# Patient Record
Sex: Female | Born: 1935 | Race: Black or African American | Hispanic: No | Smoking: Never smoker
Health system: Southern US, Community
[De-identification: ages and names within clinical notes are randomized; demographics above are authoritative.]

## PROBLEM LIST (undated history)

## (undated) DIAGNOSIS — I6529 Occlusion and stenosis of unspecified carotid artery: Secondary | ICD-10-CM

## (undated) DIAGNOSIS — I739 Peripheral vascular disease, unspecified: Secondary | ICD-10-CM

## (undated) DIAGNOSIS — E785 Hyperlipidemia, unspecified: Secondary | ICD-10-CM

## (undated) DIAGNOSIS — I10 Essential (primary) hypertension: Secondary | ICD-10-CM

## (undated) DIAGNOSIS — M1990 Unspecified osteoarthritis, unspecified site: Secondary | ICD-10-CM

## (undated) DIAGNOSIS — I639 Cerebral infarction, unspecified: Secondary | ICD-10-CM

## (undated) DIAGNOSIS — C569 Malignant neoplasm of unspecified ovary: Secondary | ICD-10-CM

## (undated) DIAGNOSIS — I701 Atherosclerosis of renal artery: Secondary | ICD-10-CM

## (undated) DIAGNOSIS — I350 Nonrheumatic aortic (valve) stenosis: Secondary | ICD-10-CM

## (undated) DIAGNOSIS — I671 Cerebral aneurysm, nonruptured: Secondary | ICD-10-CM

## (undated) DIAGNOSIS — I82409 Acute embolism and thrombosis of unspecified deep veins of unspecified lower extremity: Secondary | ICD-10-CM

## (undated) DIAGNOSIS — N189 Chronic kidney disease, unspecified: Secondary | ICD-10-CM

## (undated) DIAGNOSIS — I2510 Atherosclerotic heart disease of native coronary artery without angina pectoris: Secondary | ICD-10-CM

## (undated) DIAGNOSIS — D649 Anemia, unspecified: Secondary | ICD-10-CM

## (undated) HISTORY — PX: CORONARY ANGIOPLASTY WITH STENT PLACEMENT: SHX49

## (undated) HISTORY — PX: FEMORAL ARTERY STENT: SHX1583

## (undated) HISTORY — DX: Cerebral aneurysm, nonruptured: I67.1

## (undated) HISTORY — PX: APPENDECTOMY: SHX54

## (undated) HISTORY — DX: Malignant neoplasm of unspecified ovary: C56.9

## (undated) HISTORY — PX: BLADDER SURGERY: SHX569

## (undated) HISTORY — PX: TRANSTHORACIC ECHOCARDIOGRAM: SHX275

## (undated) HISTORY — DX: Occlusion and stenosis of unspecified carotid artery: I65.29

## (undated) HISTORY — DX: Hyperlipidemia, unspecified: E78.5

## (undated) HISTORY — DX: Atherosclerosis of renal artery: I70.1

## (undated) HISTORY — DX: Anemia, unspecified: D64.9

## (undated) HISTORY — PX: ABDOMINAL HYSTERECTOMY: SHX81

## (undated) HISTORY — PX: BREAST LUMPECTOMY: SHX2

## (undated) HISTORY — DX: Nonrheumatic aortic (valve) stenosis: I35.0

## (undated) HISTORY — DX: Acute embolism and thrombosis of unspecified deep veins of unspecified lower extremity: I82.409

## (undated) HISTORY — PX: OTHER SURGICAL HISTORY: SHX169

---

## 1961-08-26 DIAGNOSIS — C569 Malignant neoplasm of unspecified ovary: Secondary | ICD-10-CM

## 1961-08-26 HISTORY — DX: Malignant neoplasm of unspecified ovary: C56.9

## 1962-08-26 DIAGNOSIS — N189 Chronic kidney disease, unspecified: Secondary | ICD-10-CM

## 1962-08-26 HISTORY — DX: Chronic kidney disease, unspecified: N18.9

## 1998-11-28 ENCOUNTER — Emergency Department (HOSPITAL_COMMUNITY): Admission: EM | Admit: 1998-11-28 | Discharge: 1998-11-28 | Payer: Self-pay | Admitting: Emergency Medicine

## 1998-11-28 ENCOUNTER — Encounter: Payer: Self-pay | Admitting: Emergency Medicine

## 1999-09-14 ENCOUNTER — Encounter: Payer: Self-pay | Admitting: Emergency Medicine

## 1999-09-14 ENCOUNTER — Emergency Department (HOSPITAL_COMMUNITY): Admission: EM | Admit: 1999-09-14 | Discharge: 1999-09-14 | Payer: Self-pay | Admitting: Emergency Medicine

## 1999-12-10 ENCOUNTER — Emergency Department (HOSPITAL_COMMUNITY): Admission: EM | Admit: 1999-12-10 | Discharge: 1999-12-10 | Payer: Self-pay | Admitting: Emergency Medicine

## 2002-03-28 ENCOUNTER — Encounter: Payer: Self-pay | Admitting: Emergency Medicine

## 2002-03-28 ENCOUNTER — Emergency Department (HOSPITAL_COMMUNITY): Admission: EM | Admit: 2002-03-28 | Discharge: 2002-03-28 | Payer: Self-pay | Admitting: Emergency Medicine

## 2002-04-07 ENCOUNTER — Encounter: Payer: Self-pay | Admitting: Cardiology

## 2002-04-07 ENCOUNTER — Encounter: Admission: RE | Admit: 2002-04-07 | Discharge: 2002-04-07 | Payer: Self-pay | Admitting: Cardiology

## 2002-04-12 ENCOUNTER — Ambulatory Visit (HOSPITAL_COMMUNITY): Admission: RE | Admit: 2002-04-12 | Discharge: 2002-04-12 | Payer: Self-pay | Admitting: Cardiology

## 2004-03-07 ENCOUNTER — Ambulatory Visit (HOSPITAL_COMMUNITY): Admission: RE | Admit: 2004-03-07 | Discharge: 2004-03-07 | Payer: Self-pay | Admitting: *Deleted

## 2004-03-21 ENCOUNTER — Ambulatory Visit (HOSPITAL_COMMUNITY): Admission: RE | Admit: 2004-03-21 | Discharge: 2004-03-22 | Payer: Self-pay | Admitting: *Deleted

## 2004-04-04 ENCOUNTER — Ambulatory Visit (HOSPITAL_COMMUNITY): Admission: RE | Admit: 2004-04-04 | Discharge: 2004-04-05 | Payer: Self-pay | Admitting: *Deleted

## 2004-08-26 HISTORY — PX: RENAL ARTERY STENT: SHX2321

## 2004-08-26 HISTORY — PX: ILIAC ARTERY STENT: SHX1786

## 2005-05-26 DIAGNOSIS — I25119 Atherosclerotic heart disease of native coronary artery with unspecified angina pectoris: Secondary | ICD-10-CM | POA: Insufficient documentation

## 2005-05-26 DIAGNOSIS — I2510 Atherosclerotic heart disease of native coronary artery without angina pectoris: Secondary | ICD-10-CM

## 2005-05-26 HISTORY — DX: Atherosclerotic heart disease of native coronary artery without angina pectoris: I25.10

## 2005-05-27 ENCOUNTER — Ambulatory Visit (HOSPITAL_COMMUNITY): Admission: RE | Admit: 2005-05-27 | Discharge: 2005-05-28 | Payer: Self-pay | Admitting: Cardiology

## 2005-05-27 HISTORY — PX: CORONARY ANGIOPLASTY WITH STENT PLACEMENT: SHX49

## 2005-07-10 ENCOUNTER — Ambulatory Visit (HOSPITAL_COMMUNITY): Admission: RE | Admit: 2005-07-10 | Discharge: 2005-07-11 | Payer: Self-pay | Admitting: *Deleted

## 2005-07-31 ENCOUNTER — Ambulatory Visit: Payer: Self-pay | Admitting: *Deleted

## 2005-09-10 ENCOUNTER — Encounter: Admission: RE | Admit: 2005-09-10 | Discharge: 2005-09-10 | Payer: Self-pay | Admitting: Cardiology

## 2005-09-17 ENCOUNTER — Ambulatory Visit (HOSPITAL_COMMUNITY): Admission: AD | Admit: 2005-09-17 | Discharge: 2005-09-18 | Payer: Self-pay | Admitting: Cardiology

## 2006-02-04 ENCOUNTER — Ambulatory Visit: Payer: Self-pay | Admitting: Family Medicine

## 2006-02-10 ENCOUNTER — Ambulatory Visit: Payer: Self-pay | Admitting: Family Medicine

## 2006-03-03 ENCOUNTER — Ambulatory Visit: Payer: Self-pay | Admitting: Family Medicine

## 2006-03-03 ENCOUNTER — Encounter: Admission: RE | Admit: 2006-03-03 | Discharge: 2006-03-03 | Payer: Self-pay | Admitting: Family Medicine

## 2006-03-18 ENCOUNTER — Ambulatory Visit: Payer: Self-pay | Admitting: Internal Medicine

## 2006-04-01 ENCOUNTER — Ambulatory Visit: Payer: Self-pay | Admitting: Family Medicine

## 2006-04-04 ENCOUNTER — Emergency Department (HOSPITAL_COMMUNITY): Admission: EM | Admit: 2006-04-04 | Discharge: 2006-04-04 | Payer: Self-pay | Admitting: Emergency Medicine

## 2006-05-24 ENCOUNTER — Emergency Department (HOSPITAL_COMMUNITY): Admission: EM | Admit: 2006-05-24 | Discharge: 2006-05-24 | Payer: Self-pay | Admitting: Emergency Medicine

## 2006-05-26 ENCOUNTER — Ambulatory Visit: Payer: Self-pay | Admitting: Family Medicine

## 2006-05-27 ENCOUNTER — Emergency Department (HOSPITAL_COMMUNITY): Admission: EM | Admit: 2006-05-27 | Discharge: 2006-05-27 | Payer: Self-pay | Admitting: Emergency Medicine

## 2006-05-27 ENCOUNTER — Encounter: Admission: RE | Admit: 2006-05-27 | Discharge: 2006-05-27 | Payer: Self-pay | Admitting: Family Medicine

## 2006-05-29 ENCOUNTER — Ambulatory Visit: Payer: Self-pay | Admitting: Family Medicine

## 2006-07-29 ENCOUNTER — Ambulatory Visit: Payer: Self-pay | Admitting: Family Medicine

## 2007-01-12 IMAGING — CR DG CHEST 2V
2 series · 2 of 2 positions shown · non-contrast
Comparison: 09/10/05

CLINICAL DATA: Chronic cough. 
 CHEST ? 2 VIEW:

[w chest pa]
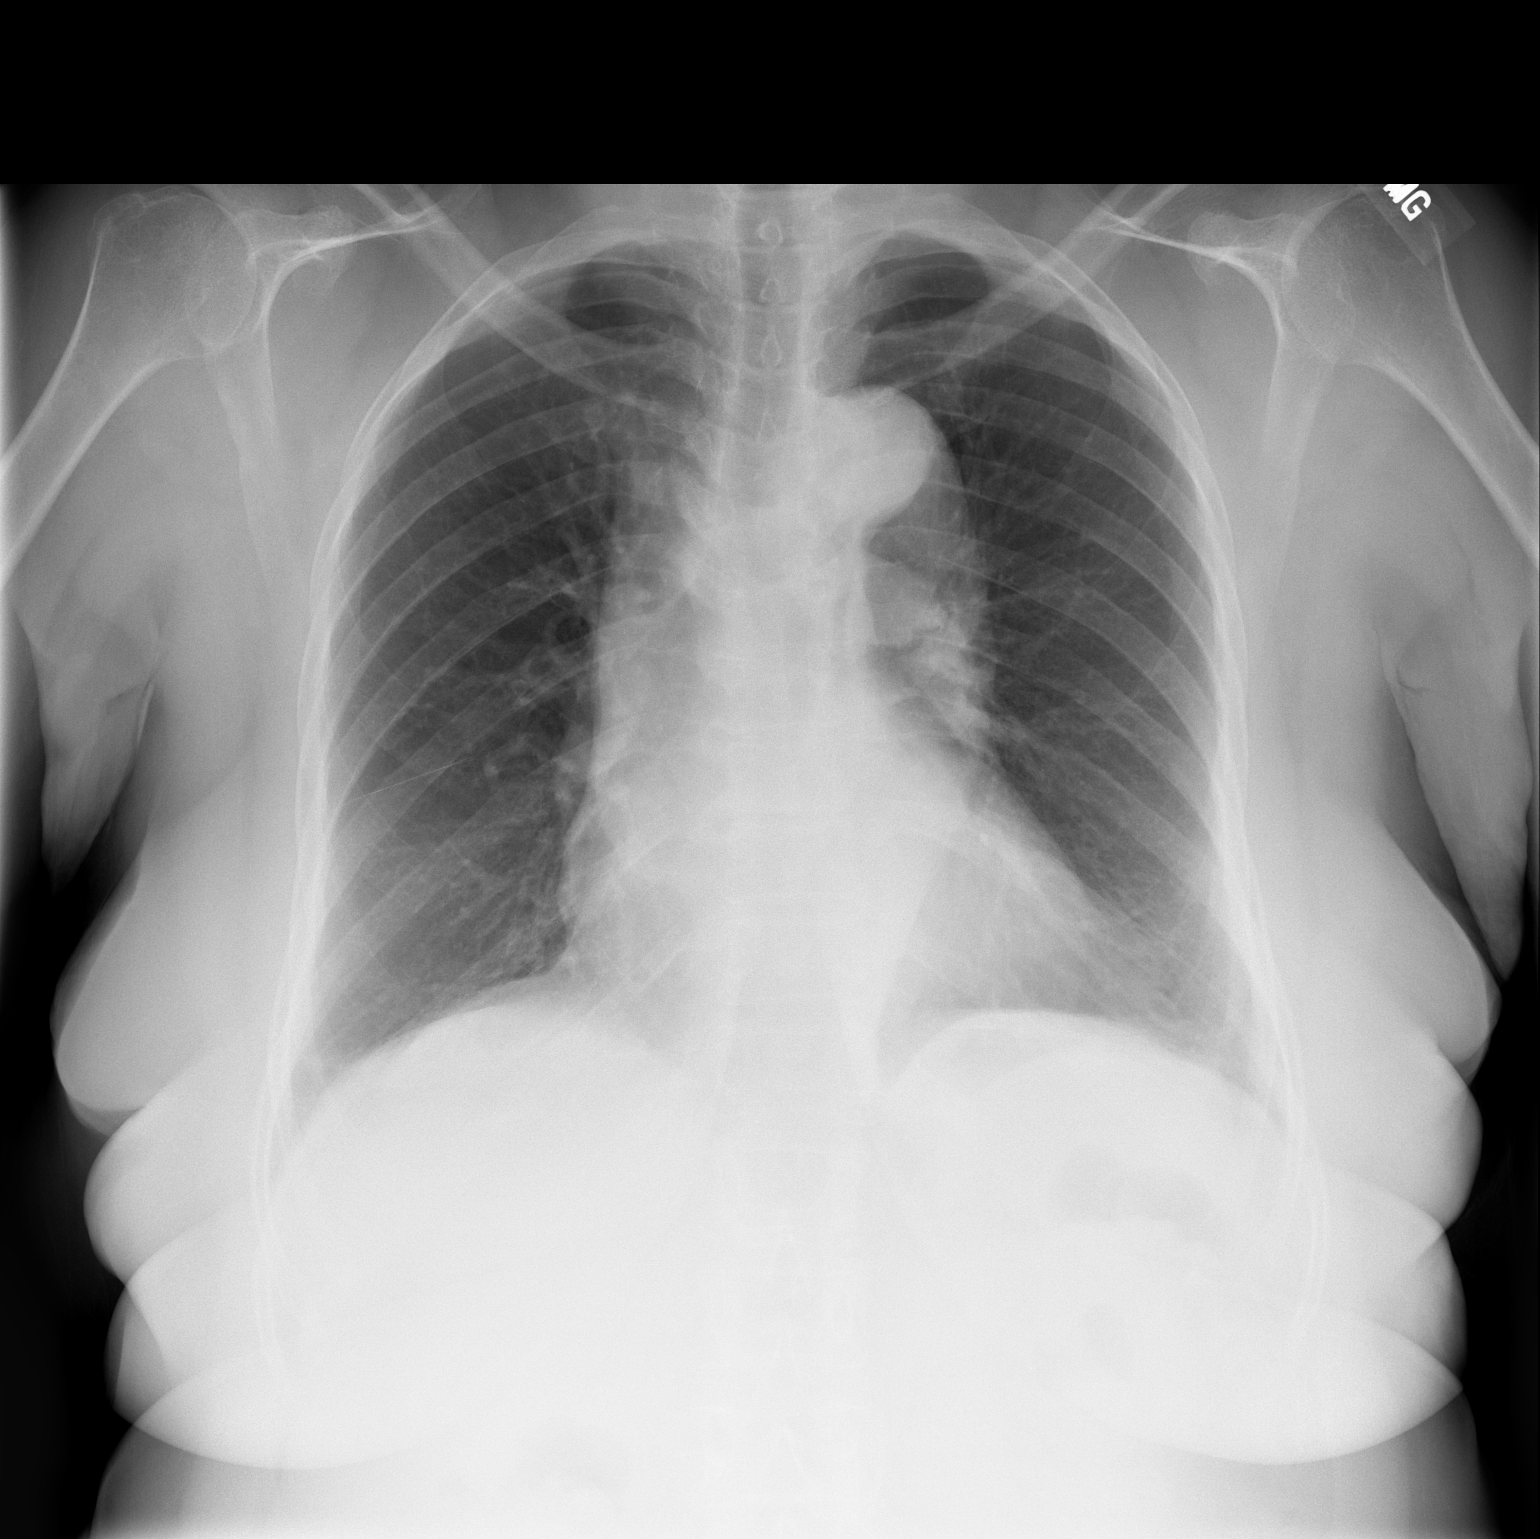

[w chest lat]
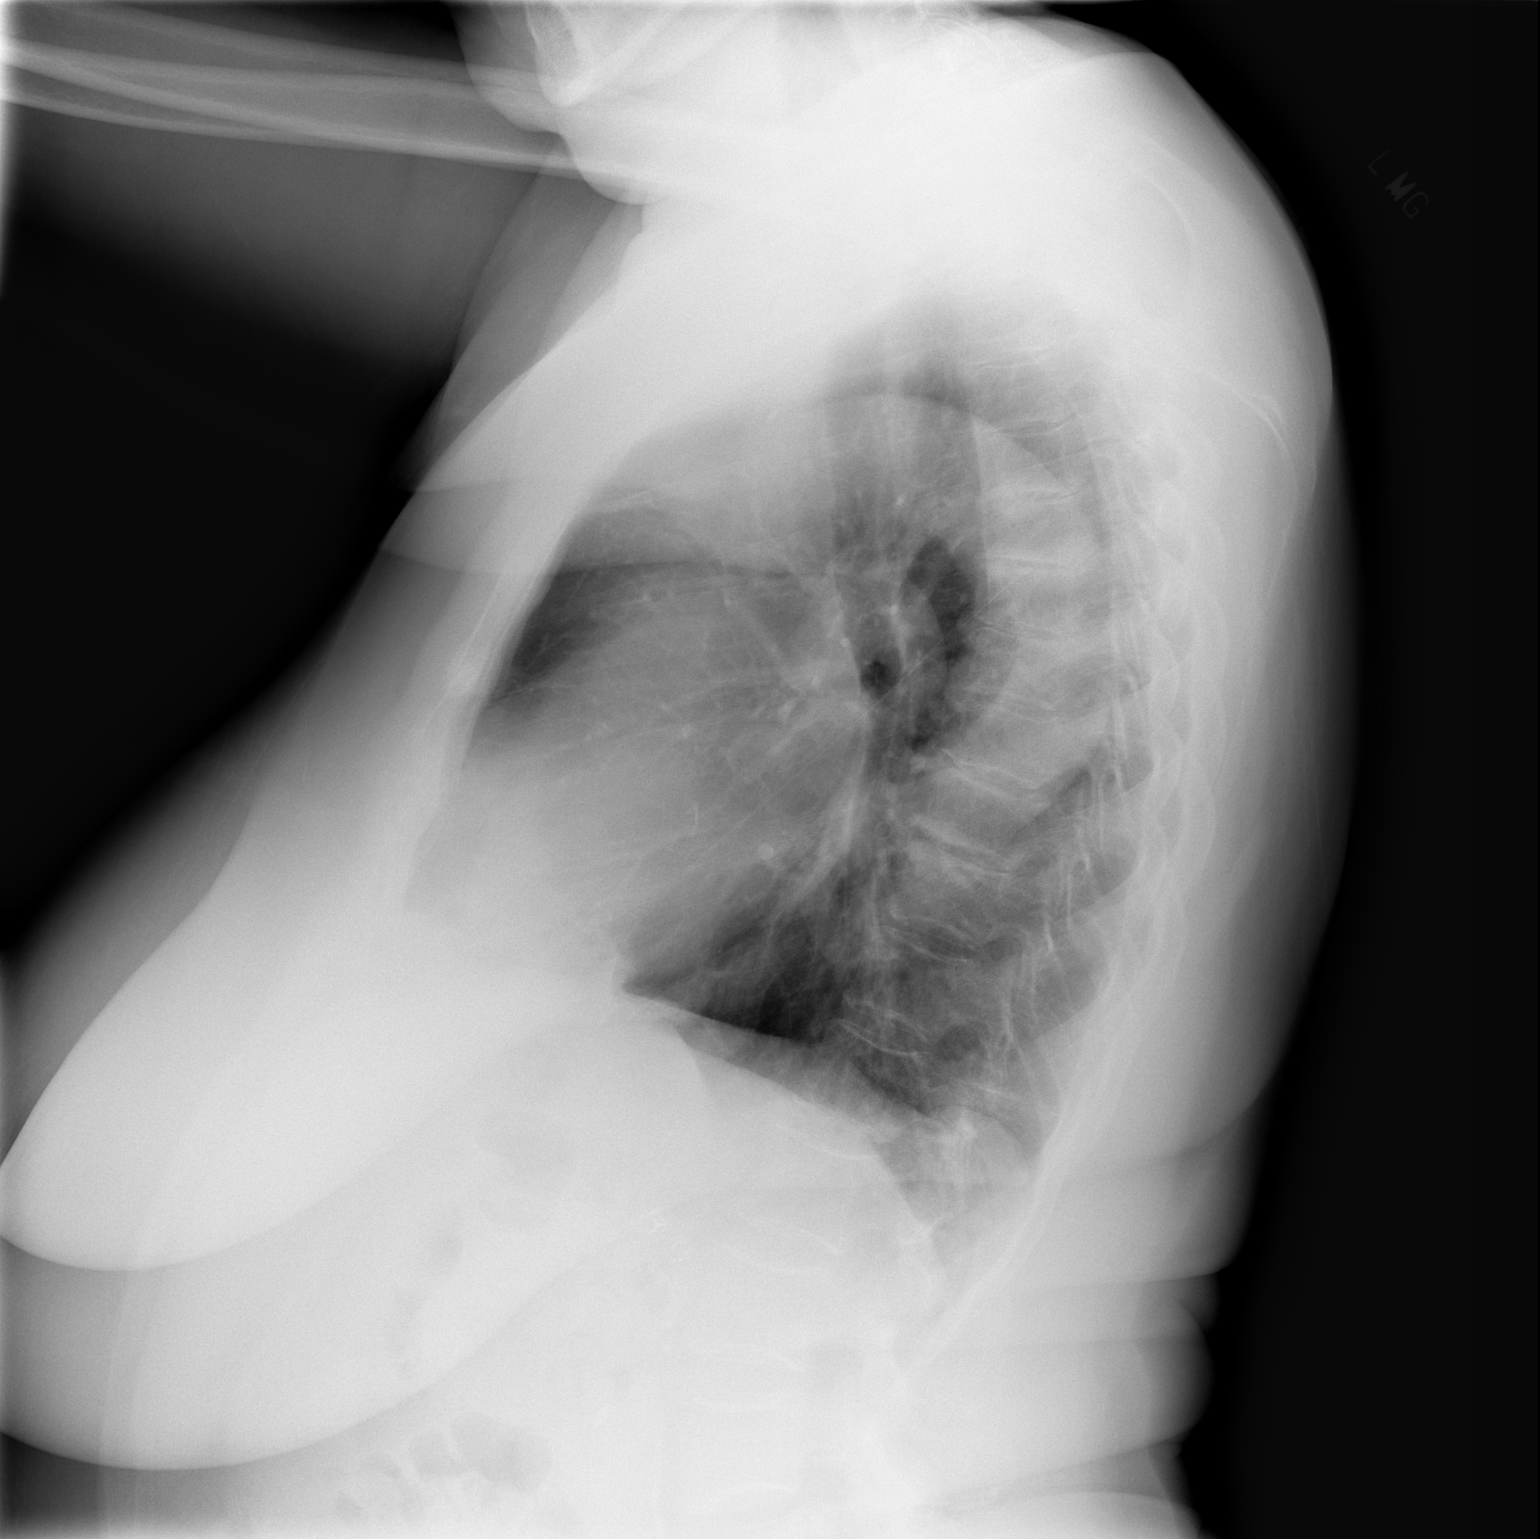

[2 of 2 positions shown; findings below may reference images not displayed]

FINDINGS: Heart size within normal limits.  Aorta tortuous.  Lungs hyperaerated with mildly prominent basilar markings, but no acute process.  Lung base aeration has improved since the prior study.
IMPRESSION: Probable mild COPD with prominent basilar markings that appear chronic.   No acute process.

## 2007-07-15 ENCOUNTER — Ambulatory Visit: Payer: Self-pay | Admitting: Family Medicine

## 2007-08-24 ENCOUNTER — Ambulatory Visit: Payer: Self-pay | Admitting: Family Medicine

## 2007-09-24 ENCOUNTER — Ambulatory Visit: Payer: Self-pay | Admitting: Family Medicine

## 2007-11-12 ENCOUNTER — Ambulatory Visit: Payer: Self-pay | Admitting: Family Medicine

## 2007-12-01 ENCOUNTER — Ambulatory Visit: Payer: Self-pay | Admitting: Family Medicine

## 2007-12-08 ENCOUNTER — Encounter: Admission: RE | Admit: 2007-12-08 | Discharge: 2007-12-08 | Payer: Self-pay | Admitting: Cardiology

## 2007-12-21 ENCOUNTER — Ambulatory Visit: Payer: Self-pay | Admitting: Family Medicine

## 2008-01-04 ENCOUNTER — Ambulatory Visit: Payer: Self-pay | Admitting: Family Medicine

## 2008-06-07 ENCOUNTER — Ambulatory Visit: Payer: Self-pay | Admitting: Family Medicine

## 2008-08-26 DIAGNOSIS — I739 Peripheral vascular disease, unspecified: Secondary | ICD-10-CM

## 2008-08-26 HISTORY — DX: Peripheral vascular disease, unspecified: I73.9

## 2008-11-18 ENCOUNTER — Encounter: Admission: RE | Admit: 2008-11-18 | Discharge: 2008-11-18 | Payer: Self-pay | Admitting: Cardiology

## 2008-12-06 ENCOUNTER — Inpatient Hospital Stay (HOSPITAL_COMMUNITY): Admission: RE | Admit: 2008-12-06 | Discharge: 2008-12-07 | Payer: Self-pay | Admitting: Cardiology

## 2008-12-16 ENCOUNTER — Ambulatory Visit: Payer: Self-pay | Admitting: Family Medicine

## 2009-01-10 ENCOUNTER — Ambulatory Visit: Payer: Self-pay | Admitting: Family Medicine

## 2009-01-19 ENCOUNTER — Encounter (INDEPENDENT_AMBULATORY_CARE_PROVIDER_SITE_OTHER): Payer: Self-pay | Admitting: General Surgery

## 2009-01-19 ENCOUNTER — Ambulatory Visit (HOSPITAL_COMMUNITY): Admission: RE | Admit: 2009-01-19 | Discharge: 2009-01-20 | Payer: Self-pay | Admitting: General Surgery

## 2009-05-04 ENCOUNTER — Ambulatory Visit: Payer: Self-pay | Admitting: Family Medicine

## 2009-05-19 ENCOUNTER — Ambulatory Visit: Payer: Self-pay | Admitting: Family Medicine

## 2009-06-29 ENCOUNTER — Encounter: Admission: RE | Admit: 2009-06-29 | Discharge: 2009-06-29 | Payer: Self-pay | Admitting: Family Medicine

## 2009-06-29 ENCOUNTER — Ambulatory Visit: Payer: Self-pay | Admitting: Family Medicine

## 2009-07-19 ENCOUNTER — Ambulatory Visit: Payer: Self-pay | Admitting: Family Medicine

## 2009-08-04 ENCOUNTER — Ambulatory Visit: Payer: Self-pay | Admitting: Family Medicine

## 2009-09-22 ENCOUNTER — Inpatient Hospital Stay (HOSPITAL_COMMUNITY): Admission: EM | Admit: 2009-09-22 | Discharge: 2009-09-25 | Payer: Self-pay | Admitting: Emergency Medicine

## 2009-09-25 HISTORY — PX: DOPPLER ECHOCARDIOGRAPHY: SHX263

## 2009-10-02 ENCOUNTER — Ambulatory Visit: Payer: Self-pay | Admitting: Family Medicine

## 2009-10-13 HISTORY — PX: NM MYOCAR PERF WALL MOTION: HXRAD629

## 2009-12-15 ENCOUNTER — Emergency Department (HOSPITAL_COMMUNITY): Admission: EM | Admit: 2009-12-15 | Discharge: 2009-12-16 | Payer: Self-pay | Admitting: Emergency Medicine

## 2009-12-20 ENCOUNTER — Ambulatory Visit: Payer: Self-pay | Admitting: Family Medicine

## 2010-01-23 ENCOUNTER — Ambulatory Visit: Payer: Self-pay | Admitting: Vascular Surgery

## 2010-03-06 ENCOUNTER — Ambulatory Visit: Payer: Self-pay | Admitting: Family Medicine

## 2010-05-26 ENCOUNTER — Inpatient Hospital Stay (HOSPITAL_COMMUNITY): Admission: EM | Admit: 2010-05-26 | Discharge: 2010-05-29 | Payer: Self-pay | Admitting: Emergency Medicine

## 2010-05-28 HISTORY — PX: CARDIAC CATHETERIZATION: SHX172

## 2010-06-04 ENCOUNTER — Observation Stay (HOSPITAL_COMMUNITY): Admission: EM | Admit: 2010-06-04 | Discharge: 2010-06-06 | Payer: Self-pay | Admitting: Emergency Medicine

## 2010-06-21 ENCOUNTER — Ambulatory Visit: Payer: Self-pay | Admitting: Family Medicine

## 2010-11-08 LAB — CBC
HCT: 33.1 % — ABNORMAL LOW (ref 36.0–46.0)
HCT: 34.9 % — ABNORMAL LOW (ref 36.0–46.0)
HCT: 35.1 % — ABNORMAL LOW (ref 36.0–46.0)
HCT: 37.7 % (ref 36.0–46.0)
Hemoglobin: 11.3 g/dL — ABNORMAL LOW (ref 12.0–15.0)
MCH: 31.6 pg (ref 26.0–34.0)
MCH: 32.1 pg (ref 26.0–34.0)
MCH: 32.3 pg (ref 26.0–34.0)
MCHC: 31.7 g/dL (ref 30.0–36.0)
MCHC: 32.2 g/dL (ref 30.0–36.0)
MCHC: 32.3 g/dL (ref 30.0–36.0)
MCV: 100 fL (ref 78.0–100.0)
MCV: 100.3 fL — ABNORMAL HIGH (ref 78.0–100.0)
MCV: 98 fL (ref 78.0–100.0)
Platelets: 151 10*3/uL (ref 150–400)
Platelets: 160 10*3/uL (ref 150–400)
RBC: 3.27 MIL/uL — ABNORMAL LOW (ref 3.87–5.11)
RBC: 3.54 MIL/uL — ABNORMAL LOW (ref 3.87–5.11)
RDW: 12.9 % (ref 11.5–15.5)
RDW: 13 % (ref 11.5–15.5)
RDW: 13 % (ref 11.5–15.5)
RDW: 13 % (ref 11.5–15.5)
WBC: 4.8 10*3/uL (ref 4.0–10.5)
WBC: 5.5 10*3/uL (ref 4.0–10.5)
WBC: 6.2 10*3/uL (ref 4.0–10.5)

## 2010-11-08 LAB — COMPREHENSIVE METABOLIC PANEL
Albumin: 3.7 g/dL (ref 3.5–5.2)
Alkaline Phosphatase: 86 U/L (ref 39–117)
BUN: 14 mg/dL (ref 6–23)
Creatinine, Ser: 1.08 mg/dL (ref 0.4–1.2)
Potassium: 3.6 mEq/L (ref 3.5–5.1)
Total Protein: 6.7 g/dL (ref 6.0–8.3)

## 2010-11-08 LAB — DIFFERENTIAL
Eosinophils Relative: 4 % (ref 0–5)
Lymphocytes Relative: 23 % (ref 12–46)
Lymphocytes Relative: 31 % (ref 12–46)
Lymphs Abs: 1.5 10*3/uL (ref 0.7–4.0)
Lymphs Abs: 1.5 10*3/uL (ref 0.7–4.0)
Monocytes Absolute: 0.4 10*3/uL (ref 0.1–1.0)
Monocytes Absolute: 0.5 10*3/uL (ref 0.1–1.0)
Monocytes Relative: 7 % (ref 3–12)
Monocytes Relative: 8 % (ref 3–12)
Neutro Abs: 2.8 10*3/uL (ref 1.7–7.7)

## 2010-11-08 LAB — GLUCOSE, CAPILLARY
Glucose-Capillary: 100 mg/dL — ABNORMAL HIGH (ref 70–99)
Glucose-Capillary: 108 mg/dL — ABNORMAL HIGH (ref 70–99)
Glucose-Capillary: 112 mg/dL — ABNORMAL HIGH (ref 70–99)
Glucose-Capillary: 137 mg/dL — ABNORMAL HIGH (ref 70–99)
Glucose-Capillary: 141 mg/dL — ABNORMAL HIGH (ref 70–99)
Glucose-Capillary: 157 mg/dL — ABNORMAL HIGH (ref 70–99)
Glucose-Capillary: 96 mg/dL (ref 70–99)

## 2010-11-08 LAB — MAGNESIUM
Magnesium: 2.4 mg/dL (ref 1.5–2.5)
Magnesium: 2.6 mg/dL — ABNORMAL HIGH (ref 1.5–2.5)

## 2010-11-08 LAB — BASIC METABOLIC PANEL
BUN: 10 mg/dL (ref 6–23)
BUN: 10 mg/dL (ref 6–23)
CO2: 24 mEq/L (ref 19–32)
CO2: 26 mEq/L (ref 19–32)
Calcium: 9.4 mg/dL (ref 8.4–10.5)
Chloride: 110 mEq/L (ref 96–112)
Creatinine, Ser: 1.2 mg/dL (ref 0.4–1.2)
GFR calc Af Amer: 52 mL/min — ABNORMAL LOW (ref >60)
GFR calc non Af Amer: 44 mL/min — ABNORMAL LOW (ref >60)
GFR calc non Af Amer: 46 mL/min — ABNORMAL LOW (ref >60)
GFR calc non Af Amer: 48 mL/min — ABNORMAL LOW (ref >60)
Glucose, Bld: 136 mg/dL — ABNORMAL HIGH (ref 70–99)
Glucose, Bld: 99 mg/dL (ref 70–99)
Potassium: 3.9 mEq/L (ref 3.5–5.1)
Potassium: 4.1 mEq/L (ref 3.5–5.1)
Sodium: 140 mEq/L (ref 135–145)
Sodium: 140 mEq/L (ref 135–145)

## 2010-11-08 LAB — CK TOTAL AND CKMB (NOT AT ARMC)
CK, MB: 2.3 ng/mL (ref 0.3–4.0)
CK, MB: 2.3 ng/mL (ref 0.3–4.0)
CK, MB: 3.1 ng/mL (ref 0.3–4.0)
Relative Index: 2.5 (ref 0.0–2.5)
Relative Index: INVALID (ref 0.0–2.5)
Total CK: 114 U/L (ref 7–177)

## 2010-11-08 LAB — CARDIAC PANEL(CRET KIN+CKTOT+MB+TROPI)
CK, MB: 2.9 ng/mL (ref 0.3–4.0)
Relative Index: 2.4 (ref 0.0–2.5)
Total CK: 135 U/L (ref 7–177)

## 2010-11-08 LAB — PROTIME-INR
INR: 1.07 (ref 0.00–1.49)
INR: 1.12 (ref 0.00–1.49)
INR: 1.16 (ref 0.00–1.49)
Prothrombin Time: 14.1 seconds (ref 11.6–15.2)
Prothrombin Time: 15 seconds (ref 11.6–15.2)

## 2010-11-08 LAB — LIPID PANEL
HDL: 42 mg/dL (ref >39)
LDL Cholesterol: 78 mg/dL (ref 0–99)
Total CHOL/HDL Ratio: 3.7 RATIO
VLDL: 37 mg/dL (ref 0–40)

## 2010-11-08 LAB — URINE MICROSCOPIC-ADD ON

## 2010-11-08 LAB — APTT: aPTT: 32 seconds (ref 24–37)

## 2010-11-08 LAB — URINALYSIS, ROUTINE W REFLEX MICROSCOPIC
Bilirubin Urine: NEGATIVE
Ketones, ur: NEGATIVE mg/dL
Nitrite: NEGATIVE
Protein, ur: 100 mg/dL — AB

## 2010-11-08 LAB — TROPONIN I: Troponin I: 0.02 ng/mL (ref 0.00–0.06)

## 2010-11-08 LAB — HEPARIN LEVEL (UNFRACTIONATED)
Heparin Unfractionated: 0.27 IU/mL — ABNORMAL LOW (ref 0.30–0.70)
Heparin Unfractionated: 0.3 IU/mL (ref 0.30–0.70)

## 2010-11-08 LAB — HEMOGLOBIN A1C: Mean Plasma Glucose: 126 mg/dL — ABNORMAL HIGH (ref <117)

## 2010-11-12 LAB — POCT CARDIAC MARKERS
CKMB, poc: 1.9 ng/mL (ref 1.0–8.0)
Myoglobin, poc: 86.3 ng/mL (ref 12–200)
Myoglobin, poc: 88.4 ng/mL (ref 12–200)
Troponin i, poc: <0.05 ng/mL (ref 0.00–0.09)

## 2010-11-12 LAB — CBC
HCT: 37.3 % (ref 36.0–46.0)
Hemoglobin: 12.2 g/dL (ref 12.0–15.0)
MCHC: 33.2 g/dL (ref 30.0–36.0)
MCV: 99.5 fL (ref 78.0–100.0)
Platelets: 138 10*3/uL — ABNORMAL LOW (ref 150–400)
RBC: 3.44 MIL/uL — ABNORMAL LOW (ref 3.87–5.11)
RBC: 3.69 MIL/uL — ABNORMAL LOW (ref 3.87–5.11)
RBC: 3.75 MIL/uL — ABNORMAL LOW (ref 3.87–5.11)
RDW: 13.9 % (ref 11.5–15.5)
WBC: 5.6 10*3/uL (ref 4.0–10.5)
WBC: 5.8 10*3/uL (ref 4.0–10.5)

## 2010-11-12 LAB — GLUCOSE, CAPILLARY
Glucose-Capillary: 107 mg/dL — ABNORMAL HIGH (ref 70–99)
Glucose-Capillary: 115 mg/dL — ABNORMAL HIGH (ref 70–99)
Glucose-Capillary: 121 mg/dL — ABNORMAL HIGH (ref 70–99)
Glucose-Capillary: 123 mg/dL — ABNORMAL HIGH (ref 70–99)
Glucose-Capillary: 151 mg/dL — ABNORMAL HIGH (ref 70–99)

## 2010-11-12 LAB — BASIC METABOLIC PANEL
BUN: 12 mg/dL (ref 6–23)
BUN: 13 mg/dL (ref 6–23)
CO2: 25 mEq/L (ref 19–32)
Calcium: 9.2 mg/dL (ref 8.4–10.5)
Calcium: 9.8 mg/dL (ref 8.4–10.5)
Chloride: 109 mEq/L (ref 96–112)
Creatinine, Ser: 1.03 mg/dL (ref 0.4–1.2)
Creatinine, Ser: 1.03 mg/dL (ref 0.4–1.2)
Creatinine, Ser: 1.13 mg/dL (ref 0.4–1.2)
GFR calc Af Amer: 56 mL/min — ABNORMAL LOW (ref >60)
GFR calc Af Amer: >60 mL/min (ref >60)
GFR calc non Af Amer: 46 mL/min — ABNORMAL LOW (ref >60)
GFR calc non Af Amer: 51 mL/min — ABNORMAL LOW (ref >60)
GFR calc non Af Amer: 52 mL/min — ABNORMAL LOW (ref >60)
Glucose, Bld: 108 mg/dL — ABNORMAL HIGH (ref 70–99)
Glucose, Bld: 131 mg/dL — ABNORMAL HIGH (ref 70–99)
Potassium: 3.3 mEq/L — ABNORMAL LOW (ref 3.5–5.1)
Sodium: 142 mEq/L (ref 135–145)

## 2010-11-12 LAB — DIFFERENTIAL
Basophils Absolute: 0 10*3/uL (ref 0.0–0.1)
Eosinophils Absolute: 0.2 10*3/uL (ref 0.0–0.7)
Eosinophils Relative: 5 % (ref 0–5)
Lymphocytes Relative: 23 % (ref 12–46)
Lymphocytes Relative: 27 % (ref 12–46)
Lymphs Abs: 1.3 10*3/uL (ref 0.7–4.0)
Lymphs Abs: 1.7 10*3/uL (ref 0.7–4.0)
Monocytes Absolute: 0.7 10*3/uL (ref 0.1–1.0)
Monocytes Relative: 11 % (ref 3–12)
Monocytes Relative: 9 % (ref 3–12)
Neutro Abs: 3.6 10*3/uL (ref 1.7–7.7)
Neutrophils Relative %: 64 % (ref 43–77)

## 2010-11-12 LAB — CARDIAC PANEL(CRET KIN+CKTOT+MB+TROPI)
CK, MB: 1.9 ng/mL (ref 0.3–4.0)
CK, MB: 2 ng/mL (ref 0.3–4.0)
CK, MB: 2.2 ng/mL (ref 0.3–4.0)
CK, MB: 2.4 ng/mL (ref 0.3–4.0)
Relative Index: 2.2 (ref 0.0–2.5)
Relative Index: INVALID (ref 0.0–2.5)
Total CK: 100 U/L (ref 7–177)
Total CK: 113 U/L (ref 7–177)
Total CK: 81 U/L (ref 7–177)
Troponin I: 0.02 ng/mL (ref 0.00–0.06)
Troponin I: 0.06 ng/mL (ref 0.00–0.06)

## 2010-11-12 LAB — PHOSPHORUS: Phosphorus: 3.5 mg/dL (ref 2.3–4.6)

## 2010-11-13 LAB — POCT I-STAT, CHEM 8
BUN: 11 mg/dL (ref 6–23)
Calcium, Ion: 1.2 mmol/L (ref 1.12–1.32)
Chloride: 108 mEq/L (ref 96–112)
Glucose, Bld: 126 mg/dL — ABNORMAL HIGH (ref 70–99)
TCO2: 24 mmol/L (ref 0–100)

## 2010-11-13 LAB — CBC
HCT: 37 % (ref 36.0–46.0)
Hemoglobin: 12.6 g/dL (ref 12.0–15.0)
MCHC: 34 g/dL (ref 30.0–36.0)
RBC: 3.72 MIL/uL — ABNORMAL LOW (ref 3.87–5.11)
RDW: 13.9 % (ref 11.5–15.5)

## 2010-11-13 LAB — DIFFERENTIAL
Basophils Absolute: 0 10*3/uL (ref 0.0–0.1)
Eosinophils Relative: 5 % (ref 0–5)
Lymphocytes Relative: 29 % (ref 12–46)
Monocytes Absolute: 0.7 10*3/uL (ref 0.1–1.0)
Monocytes Relative: 12 % (ref 3–12)
Neutro Abs: 3.3 10*3/uL (ref 1.7–7.7)

## 2010-11-13 LAB — POCT CARDIAC MARKERS: Troponin i, poc: <0.05 ng/mL (ref 0.00–0.09)

## 2010-11-26 ENCOUNTER — Ambulatory Visit: Payer: Self-pay | Admitting: Family Medicine

## 2010-12-04 LAB — CBC
HCT: 34 % — ABNORMAL LOW (ref 36.0–46.0)
Hemoglobin: 11.2 g/dL — ABNORMAL LOW (ref 12.0–15.0)
MCV: 99.2 fL (ref 78.0–100.0)
Platelets: 167 10*3/uL (ref 150–400)
RBC: 3.43 MIL/uL — ABNORMAL LOW (ref 3.87–5.11)
WBC: 6.5 10*3/uL (ref 4.0–10.5)

## 2010-12-04 LAB — COMPREHENSIVE METABOLIC PANEL
Albumin: 3.7 g/dL (ref 3.5–5.2)
Alkaline Phosphatase: 81 U/L (ref 39–117)
BUN: 17 mg/dL (ref 6–23)
CO2: 28 mEq/L (ref 19–32)
Chloride: 103 mEq/L (ref 96–112)
Creatinine, Ser: 1.39 mg/dL — ABNORMAL HIGH (ref 0.4–1.2)
GFR calc non Af Amer: 36 mL/min — ABNORMAL LOW (ref >60)
Glucose, Bld: 127 mg/dL — ABNORMAL HIGH (ref 70–99)
Potassium: 3.4 mEq/L — ABNORMAL LOW (ref 3.5–5.1)
Total Bilirubin: 0.8 mg/dL (ref 0.3–1.2)

## 2010-12-04 LAB — DIFFERENTIAL
Basophils Absolute: 0 10*3/uL (ref 0.0–0.1)
Basophils Relative: 1 % (ref 0–1)
Lymphocytes Relative: 31 % (ref 12–46)
Monocytes Absolute: 0.7 10*3/uL (ref 0.1–1.0)
Neutro Abs: 3.5 10*3/uL (ref 1.7–7.7)
Neutrophils Relative %: 53 % (ref 43–77)

## 2010-12-05 LAB — CBC
HCT: 34.8 % — ABNORMAL LOW (ref 36.0–46.0)
Hemoglobin: 11.8 g/dL — ABNORMAL LOW (ref 12.0–15.0)
MCHC: 33.8 g/dL (ref 30.0–36.0)
MCV: 98 fL (ref 78.0–100.0)
Platelets: 128 K/uL — ABNORMAL LOW (ref 150–400)
RBC: 3.55 MIL/uL — ABNORMAL LOW (ref 3.87–5.11)
RDW: 12.8 % (ref 11.5–15.5)
WBC: 7.4 K/uL (ref 4.0–10.5)

## 2010-12-05 LAB — BASIC METABOLIC PANEL
BUN: 25 mg/dL — ABNORMAL HIGH (ref 6–23)
Chloride: 110 mEq/L (ref 96–112)
GFR calc Af Amer: 43 mL/min — ABNORMAL LOW (ref >60)
GFR calc non Af Amer: 36 mL/min — ABNORMAL LOW (ref >60)
Potassium: 4.1 mEq/L (ref 3.5–5.1)

## 2011-01-08 NOTE — Op Note (Signed)
NAMEYESLIN, [name]              ACCOUNT NO.:  [id number]   MEDICAL RECORD NO.:  [id number]          PATIENT TYPE:  OIB   LOCATION:  1523                         FACILITY:  [facility]   PHYSICIAN:  Anselm Pancoast. Weatherly, M.D.DATE OF BIRTH:  1924-06-01   DATE OF PROCEDURE:  01/19/2009  DATE OF DISCHARGE:                               OPERATIVE REPORT   PREOPERATIVE DIAGNOSES:  Bleeding, prolapsing internal and external  hemorrhoids.   OPERATION:  Internal, external hemorrhoidectomy anterior in the right  lateral quadrant under general anesthesia in lithotomy position.   HISTORY:  [name] is an 77 year old black female who was referred  to me by Dr. Sharlot Gowda for problems with painful bleeding, prolapsing  hemorrhoids.  She has a history of atherosclerotic cardiovascular  disease and has two cardiac stents and she is also had a stent in the  iliacs.  On examination the office she has a very large hemorrhoid that  prolapses anteriorly, moderate-sized on the right lateral and with the  Plavix and other medications, I thought it was really necessary to go  ahead and plan on excising this in the operating room and we stopped the  Plavix for about 5 days.  I talked with her cardiologist and he said  that her stents and all were such that the Plavix could be discontinued  and she is here today for the planned procedure.   DESCRIPTION OF PROCEDURE:  The patient was taken to the operative suite,  LOA and then after induction of general anesthesia, she received 3 grams  of Unasyn.  The time-out completed and then she was placed up in the  yellow fin stirrups.  First I did a proctoscopic exam and with the  exception of the hemorrhoids to about 20 cm, nothing significant was  identified and she has got a pretty good prep.  I then prepped the anal  area with Betadine scrub and solution and draped in a sterile manner.  The anesthesiologist requested that I not give her the Marcaine with  adrenaline because of her elevated high blood pressure, so we gave him  the 0.25% Marcaine without adrenaline for anesthetizing the sphincter  area.  The large hemorrhoid anteriorly I elevated it off of the  sphincter using the cautery and dissection with Metzenbaum and then  after the sphincter was visualized, I used a Bowie clamp to underclamp  the hemorrhoid and then removed it.  I then put U stitches of 3-0  chromic and then starting at the apex with a large 3-0 chromic suture I  removed the Bowie clamp and then sutured over the mucosal edges with a  running locking 2-0 chromic.  The external area of the skin, I kind of  closed it with a T since it is anterior so it does not go on up towards  the actual vagina.  The area after completion of this looked like she  still had a little bit of a hemorrhoid up high and I elected to ligate  that.  I do not want to compromise her rectum or vagina and she has got  a  rectocele so I just put a figure-of-eight of 2-0 chromic, but did not  actually removed the area higher sort of like a rubber band management.  With this completed, there was no evidence of bleeding.  Then the next  hemorrhoid that was not that large right lateral posterior, it was  elevated off the sphincter, underclamped with Bowie and then the U  stitches of 2-0 chromic and then the suture in closing the actual  mucosal edge coming out to the outside with a running 2-0 chromic.  The  opposite area in her anus, I really think it would be best not to do the  lateral on the left side since it very small and I think will cause  problem with a stenotic anus.  The patient will be kept overnight.  I  anoscoped her at completion, looked at all the suture lines, no bleeding  and then put Anusol within the rectum, held in place with open 4x4 and  we will start Sitz baths this evening.  Hopefully she will do fine and  be able to be discharged in the a.m.      Anselm Pancoast. Zachery Dakins, M.D.   Electronically Signed     WJW/MEDQ  D:  01/19/2009  T:  01/19/2009  Job:  [id number]   cc:   Sharlot Gowda, M.D.  Fax: [phone]

## 2011-01-08 NOTE — Discharge Summary (Signed)
[name], DARA              ACCOUNT NO.:  [id number]   MEDICAL RECORD NO.:  [id number]          PATIENT TYPE:  INP   LOCATION:  3735                         FACILITY:  [facility]   PHYSICIAN:  Thereasa Solo. Little, M.D. DATE OF BIRTH:  01-25-1924   DATE OF ADMISSION:  12/06/2008  DATE OF DISCHARGE:  12/07/2008                               DISCHARGE SUMMARY   DISCHARGE DIAGNOSES:  1. Claudication and decreased ankle-brachial indices, lifestyle      limiting.  2. Peripheral vascular disease, status post pulmonary vein angiogram      with right total superficial femoral artery.  She underwent      stenting by Dr. Jacinto Halim on December 06, 2008.  3. Hypertension.  4. Chronic renal insufficiency/chronic kidney disease.  5. Known coronary artery disease.  6. Hyperlipidemia.   LABORATORIES:  Hemoglobin 11.8, hematocrit 34.8, WBC 7.4, and platelets  128.  Sodium 140, potassium 4.1, BUN 25, creatinine 1.41, and pre-cath  creatinine was 1.4.   HOSPITAL COURSE:  [name] came in as an elective PV angiogram  secondary to claudication, decreased ABIs, lifestyle-limiting  claudication.  She had held her ACE and diuretics at least 1 day before  her procedure.  She underwent PV angiogram.  Her renal stents were  patent.  She had a total right SFA.  She underwent intervention with a  LifeStent XL Vascular x2, placed in her right SFA.  The left system was  not evaluated.  She had a residual 40% popliteal.  Her PT was occluded.  The morning of December 07, 2008, her labs were stable.  She was seen by  Dr. Julieanne Manson.  Her blood pressure ranged from 157/86 to 104/57 and  temperature was 99.4.  She was in sinus rhythm at 66.  She was  considered stable for discharge home.   DISCHARGE MEDICATIONS:  1. Diltiazem 60 mg 2 twice a day.  2. Coreg 25 mg one b.i.d.  3. Lisinopril b.i.d.  She should hold that for 3 days.  4. Simvastatin 40 mg at bedtime.  5. Aspirin increased to 81 mg 2 per day.  6. Maxzide  37.5/25 half every day.  She should hold x3 days.  7. She should start clonidine 0.1 mg at bedtime.  8. She should start Plavix 75 mg a day.   She will follow up with Dr. Jacinto Halim in the office.  She will have Dopplers  done before her appointment with him of her right lower extremity.  She  will call if she has any problems.  Activity restrictions are for 1 week  and she should not drive for 2 days.       [name], N.P.    ______________________________  Thereasa Solo. Little, M.D.    BB/MEDQ  D:  12/07/2008  T:  12/08/2008  Job:  [id number]   cc:   Sharlot Gowda, M.D.

## 2011-01-08 NOTE — Procedures (Signed)
CAROTID DUPLEX EXAM   INDICATION:  Followup known carotid artery disease.   HISTORY:  Diabetes:  No  Cardiac:  MI, stents  Hypertension:  Yes  Smoking:  No  Previous Surgery:  No  CV History:  TIA 15 years ago  Amaurosis Fugax No, Paresthesias No, Hemiparesis No                                       RIGHT             LEFT  Brachial systolic pressure:         156               150  Brachial Doppler waveforms:         WNL               WNL  Vertebral direction of flow:        Antegrade         Antegrade  DUPLEX VELOCITIES (cm/sec)  CCA peak systolic                   88                90  ECA peak systolic                   56                47  ICA peak systolic                   202               104  ICA end diastolic                   71                33  PLAQUE MORPHOLOGY:                  Heterogeneous     Heterogeneous  PLAQUE AMOUNT:                      Moderate          Mild  PLAQUE LOCATION:                    ICA               ICA   IMPRESSION:  1. Right internal carotid artery suggests 60% to 79% stenosis.  2. Left internal carotid artery suggests 20% to 39% stenosis.  3. Antegrade flow in bilateral vertebrals.        ___________________________________________  Quita Skye Hart [place], M.D.   CB/MEDQ  D:  01/23/2010  T:  01/23/2010  Job:  [id number]

## 2011-01-08 NOTE — Cardiovascular Report (Signed)
NAMEJADALYN, [name]              ACCOUNT NO.:  [id number]   MEDICAL RECORD NO.:  [id number]          PATIENT TYPE:  AMB   LOCATION:  SDS                          FACILITY:  [facility]   PHYSICIAN:  Vonna Kotyk R. Jacinto Halim, MD       DATE OF BIRTH:  [birth year]   DATE OF PROCEDURE:  12/06/2008  DATE OF DISCHARGE:                            CARDIAC CATHETERIZATION   PROCEDURES PERFORMED:  1. Abdominal aortogram.  2. Pelvic aortogram.  3. Crossover from the left femoral artery into the right femoral      artery and placement of catheter tip into the right femoral artery.      Right femoral arteriogram with distal runoff.  4. Percutaneous transluminal angioplasty and stenting of the right      superficial femoral artery and percutaneous transluminal      angioplasty  and balloon angioplasty of the popliteal artery.   INDICATIONS:  Ms. [name] is an 77 year old female with known  peripheral arterial disease.  She has had bilateral renal artery  stenting for renovascular hypertension and renal atherosclerosis in the  past.  She is now having uncontrolled hypertension.  She also has  lifestyle-limiting claudication.  It is in the Fontan class III-IV with  rest pain in the right leg and class II-III Fontan calcification in the  left calf.  Lower extremity Dopplers suggesting high-grade stenosis and  complete occlusion of right superficial femoral artery.  She had  previously undergone right SFA PTA and balloon angioplasty alone in  2006.   ANGIOGRAPHIC DATA:  Abdominal aortogram:  Abdominal aortogram revealed  presence of two renal arteries one on either sides.  The renal artery  stents were widely patent with 10-20% in-stent restenosis.  The  aortoiliac bifurcation and the iliac arteries are widely patent.   Right femoral arteriogram distal runoff:  Right femoral arteriogram  distal runoff revealed a long segment at least 300-350 mm long segment  high-grade stenosis to complete occlusion of the  right superficial  femoral artery.  The proximal popliteal artery had a 99% stenosis.  Mid-  to-distal popliteal artery has a 50-60% stenosis.   Below the right knee, there was two-vessel runoff noted in the form of  anterior tibial and peroneal artery and the posterior tibial artery is  occluding in the mid segment.   INTERVENTION DATA:  Successful PTA and balloon angioplasty and stent  implantation of the long right superficial femoral artery stenosis.  A  6.0 x 170 and then another overlapping 6.0 x 170 followed by a 6.0 x 40  mm self-expanding stents were implanted with a post-implantation balloon  dilatation with a 4.0 x 120 mm FoxCross balloon.  Overall, the stenosis  was reduced from 100% high-grade and tandem 90% stenoses to 0% with  brisk flow noted below the right knee.   RECOMMENDATIONS:  The patient will be continued on aggressive risk  modification.  She will need Plavix for at least a period of a year.  She will also need Doppler surveillance of the lower extremities.   Her creatinine will be watched closely and she will be admitted  to the  hospital for 23 hours observation.  A total of 110 mL of contrast was  utilized for diagnostic and interventional procedure.   TECHNIQUE OF PROCEDURE:  Under usual sterile precautions using a 5-  French left femoral arterial access, a 5-French Omniflush catheter was  advanced to the abdominal aorta and abdominal aortogram was performed.  Then the same catheter was utilized to engage the right iliac artery and  right iliac arteriogram with distal runoff was performed.   TECHNIQUE OF INTERVENTION:  Using heparin for anticoagulation  maintaining ACT greater than 200, I used the Omniflush catheter to  crossover into the right common femoral artery and with the help of a  Versacore wire, I was able to place a 7-French crossover Terumo sheath  into the right common femoral artery.  Right femoral arteriogram with  distal runoff was  performed and the lesion length was measured.  I then  crossed the stenotic segments using a glide tip 4-French straight end-  hole catheter and along with a Glidewire 0.035th of an inch Glidewire  and placed the Versacore wire into the anterior tibial artery carefully.  Having performed this, a 4.0 x 120 mm FoxCross balloon was utilized and  multiple balloon inflations throughout the popliteal and superficial  femoral artery were performed followed by stenting with self-expanding  stent as dictated above.  This was post-dilated the same FoxCross  balloon.  Having performed this angiography, we had excellent result  with brisk flow.  The sheath was then pulled back into the left common  femoral artery and sutured in place.  The patient tolerated the  procedure well.  No immediate complications.      Cristy Hilts. Jacinto Halim, MD  Electronically Signed     JRG/MEDQ  D:  12/06/2008  T:  12/07/2008  Job:  [id number]   cc:   Thereasa Solo. Little, M.D.  Sharlot Gowda, M.D.

## 2011-01-08 NOTE — Consult Note (Signed)
NEW PATIENT CONSULTATION   [name], [name]  DOB:  [birth year]                                       01/23/2010  JXBJY#:[id number]   The patient is an 77 year old female referred by Dr. Susann Givens for carotid  occlusive disease.  The patient was admitted in January of this year  with dizziness and following was felt to have vertigo.  She had an MRI  which revealed no evidence of an infarct in her brain and also had a CT  scan with similar findings.  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  She had no  hemiparesis, aphasia, amaurosis fugax or other neurologic symptoms and  has had 1 other episode of the dizziness, although was much improved  with meclizine treatment.  She has no history of stroke.   CHRONIC MEDICAL PROBLEMS:  1. Coronary artery disease.  2. History of left renal artery stenosis with PTA and stenting.  3. Hypertension.  4. Hyperlipidemia.  5. Early diabetes mellitus.  6. Urinary incontinence.  7. Negative for COPD or stroke.   FAMILY HISTORY:  Negative for coronary artery disease, diabetes and  stroke.   SOCIAL HISTORY:  She is widowed, has five children, is a retired  Advertising copywriter.  Does not use tobacco or alcohol.   REVIEW OF SYSTEMS:  Negative for anorexia or weight loss.  Does have  occasional chest discomfort and dyspnea on exertion.  No productive  cough, bronchitis or wheezing.  Denies any urinary symptoms.  Does have  pain in her legs with walking and lying flat.  Has history of some  arterial blockages treated with stents.  Arthritis joint pain, muscle  pain and occasional blurry vision.  All other systems are negative in  review of systems.   PHYSICAL EXAMINATION:  Blood pressure 156/90, heart rate 61,  respirations 14.  General:  She is a well-developed, well-nourished  elderly female in no apparent  distress, alert and oriented x3.  HEENT:  Exam is normal.  EOMs intact.  Lungs:  Clear with no rhonchi or  wheezing.  Cardiovascular:  Regular rhythm.  No murmurs.  Carotid pulses  3+.  No audible bruits.  Abdomen:  Soft, nontender with no masses.  Musculoskeletal:  Free of major deformities.  Neurologic:  Normal.  Skin:  Free of rashes.  Lower extremities:  Exam reveals 3+ femoral, 2+  dorsalis pedis on the right; 3+ femoral, absent distal pulses on the  left.   I reviewed the clinical information supplied by Dr. Susann Givens including  the discharge summary from her recent hospitalization January of this  year.  She does have a history of coronary artery disease with a  negative nuclear stress test done 2 years ago.   Today I ordered a carotid duplex exam to get a better handle on her  carotid occlusive disease to compare with her MR angiogram in January.  I have reviewed and interpreted this study.  She does not have severe  carotid occlusive disease.  It appears that the right ICA has about a  60% stenosis, left ICA more in the 30% to 40% range.   I reassured her regarding these findings.  I do not think her carotid  disease  responsible for her vertigo which seems to be doing well at the  present time.  Small basilar artery aneurysm will need to be evaluated  by neurosurgery if it comes larger.  We will see her in a year for  follow-up of her carotid occlusive disease unless she develops any new  neurologic symptoms in the interim.     Quita Skye Hart [place], M.D.  Electronically Signed   JDL/MEDQ  D:  01/23/2010  T:  01/24/2010  Job:  [id number]   cc:   Sharlot Gowda, M.D.  Thereasa Solo. Little, M.D.

## 2011-01-11 NOTE — Discharge Summary (Signed)
[name], [name]              ACCOUNT NO.:  [id number]   MEDICAL RECORD NO.:  [id number]          PATIENT TYPE:  OIB   LOCATION:  6525                         FACILITY:  [facility]   PHYSICIAN:  Thereasa Solo. Little, M.D. DATE OF BIRTH:  01/25/1924   DATE OF ADMISSION:  05/27/2005  DATE OF DISCHARGE:                                 DISCHARGE SUMMARY   CHIEF COMPLAINT:  Chest pain.   ADMISSION DIAGNOSES:  1.  Atypical chest pain with positive Cardiolite, ejection fraction 79%.  2.  Hypertension, questionable renovascular.  3.  Claudication, followed by Dr. Guinevere Scarlet.   DISCHARGE DIAGNOSES:  1.  Coronary artery disease with 90% right coronary artery, status post      percutaneous coronary intervention and stenting using an ML mini Vision      2.25 x 12 mm stent.  A 40% ostial circumflex and 70% obtuse marginal      stenosis.  Left anterior descending artery small with diffuse disease.      Ejection fraction of 70%.  2.  Renal artery stenosis, right renal artery with an 80% stenosis, left      renal artery with a 70% stenosis.  This is increased from      catheterization of August 2003, at which time right renal artery was      normal, the left renal artery had 50% stenosis.  3.  Hypertension.  4.  Claudication.  5.  Hyperlipidemia.   PROCEDURES:  Cardiac catheterization, percutaneous coronary intervention and  stenting of a 90% left anterior descending using a mini Vision stent,  May 27, 2005, Dr. Clarene Duke.   BRIEF HISTORY:  The patient is a 77 year old black female medical patient of  Sharlot Gowda, M.D., also followed for claudication by Dr. Guinevere Scarlet.  She was  first seen back in 2005 by Dr. Clarene Duke for hypertension.  She has been  medically managed with improved control but still not well-controlled  hypertension.  She recently visited and her EKG was slightly abnormal, and  she was complaining of some atypical chest discomfort, a forceful heart beat  that occurred around bedtime.   Discomfort was in the left upper chest and  radiated to her back, down her chest, through her abdomen and legs to both  feet.  With this in mind, the patient was scheduled and underwent a  Persantine Cardiolite.  This showed positive anterior lateral ischemia with  an ejection fraction of 79%.  With this in mind, the patient was  subsequently scheduled and admitted for cardiac catheterization.   PAST MEDICAL HISTORY:  As above.   SOCIAL HISTORY:  She does not use tobacco, drugs or alcohol.  She does not  exercise regularly.   MEDICATIONS ON ADMISSION:  1.  Aspirin 325 mg daily.  2.  Hydrochlorothiazide 12.5 mg daily.  3.  Clonidine 0.2 mg b.i.d.  4.  Toprol XL 50 mg daily.   ALLERGIES:  CODEINE.   For further history and physical, see the office note.   HOSPITAL COURSE:  The patient was admitted, taken to the catheterization lab  the day of admission, and underwent cardiac  catheterization.  This showed  the left main to be irregular.  The circumflex was mildly irregular.  The  ostium had a 40% stenosis, and there was a large OM with a proximal 70%  stenosis.  The LAD showed mild irregularities proximally and distally, and  there was small diffuse irregular disease.  The RCA showed a mid-90% focal  area, almost napkin ring.  The PL and PDA had mild distal disease.  The LVEF  was greater than 70%.  The right renal artery had an 80% stenosis and the  left renal had 70% proximal stenosis.  The patient subsequently underwent  PCI and stenting of the RCA.  She tolerated this well and was returned to  the floor.  After completion of the study, it was Dr. Fredirick Maudlin opinion that  she will need PCIs to her renals by Dr. Guinevere Scarlet and then this will be staged  with a follow-up PCI and stenting of her OM at a later date.   DISCHARGE MEDICATIONS:  1.  Coated aspirin 325 mg daily.  2.  Clonidine 0.2 mg b.i.d.  3.  Hydrochlorothiazide 25 mg half-tablet daily.  4.  Toprol XL 50 mg daily.  5.   Plavix 75 mg daily.  6.  Pepcid 20 mg b.i.d. a.c.  7.  Imdur 30 mg b.i.d.  8.  Zocor 20 mg daily.  9.  Potassium chloride 10 mEq two daily.  10. Sublingual nitroglycerin 1/150 SL sublingual p.r.n., she can take one      every five minutes.  She is to call if she is still having discomfort      after three nitroglycerin.   DISCHARGE ACTIVITY:  Light to moderate for 72 hours.  No lifting over 10  pounds.  No driving.   She is to maintain a low-fat, low-salt diet.   Dr. Fredirick Maudlin office will call and schedule her for follow-up visit.  She is  to contact Dr. Guinevere Scarlet and have him look at her studies and determine whether  he feels she needs PCI of her renal arteries.   DISCHARGE LABS:  Hemoglobin is 10.9, hematocrit is 32, white count is 6.9,  platelets are 162,000.  Potassium is 2.6, sodium is 138, chloride is 101,  CO2 is 30, BUN is 14, creatinine is 1.3, glucose is 118.  Troponin was 0.06,  CK was 131, CK-MB was 2.6.      Eber Hong, P.A.    ______________________________  Thereasa Solo Little, M.D.    WDJ/MEDQ  D:  05/28/2005  T:  05/28/2005  Job:  [id number]   cc:   Sharlot Gowda, M.D.  Fax: [phone]   Dr. Guinevere Scarlet

## 2011-01-11 NOTE — Cardiovascular Report (Signed)
[name], [name]              ACCOUNT NO.:  [id number]   MEDICAL RECORD NO.:  [id number]          PATIENT TYPE:  OIB   LOCATION:  6527                         FACILITY:  [facility]   PHYSICIAN:  Thereasa Solo. Little, M.D. DATE OF BIRTH:  Nov 02, 1923   DATE OF PROCEDURE:  09/17/2005  DATE OF DISCHARGE:                              CARDIAC CATHETERIZATION   This 77 year old female was hospitalized August of 2006 with unstable angina  and had a stent placed to her RCA in the setting of a non-Q-wave inferior  myocardial infarction. At that same time, she had a known lesion of her  circumflex and decision was made to bring her back at a later date for  intervention.   After obtaining informed consent, the patient was prepped and draped in the  usual sterile fashion exposing the left groin. Following local anesthetic  with 1% Xylocaine, the Seldinger technique was employed and a 6-French  introducer sheath was placed into the left femoral vein and a 6-French  introducer sheath in the left femoral artery.   The patient had an iliac stent placed on the right. Renal stents placed by  Dr. Orson Slick on the right in December 2006.   Selective evaluation of right coronary artery and the left system was  performed. No ventriculogram was performed.   DIAGNOSTIC CATHETERS:  6-French and Judkins configuration catheters.   CONTRAST:  185 cc of contrast was used.   The right coronary artery had a stent in its distal portion which was widely  patent. There was mild diffuse disease throughout the entire right system  and the vessel was relatively small. There was a focal area of 80% narrowing  that was calcified in the midportion of the RCA. This was new since her  October 2006 cath.   A JL-3.5 guide catheter with side holes was used. A short Luge wire was  placed down the RCA and primary stenting to the RCA was undertaken. A 2.5 x  12 mini Vision stent was used. Initial inflation was 14 atmospheres for  55  seconds. Final inflation was 14 atmospheres for 60 seconds. The area that  had been 80% narrowed preintervention now appeared to be normal with no  evidence of any dissection or thrombus formation.   The circumflex showed a proximal OM vessel that had an eccentric 80% area of  narrowing in its midportion. There was mild irregularities throughout and  the circumflex came off at a right angle.   Primary stenting to the OM was undertaken. A JL4 guide 6-French and the  above-mentioned Luge wire was used. The initial wire would not go down and a  new Luge wire was used. With this, I was able to pass the wire into the OM  system and well distal to the area of obstruction. A 2.5 x 12 mini Vision  stent was placed in such a manner that both the proximal and distal portion  of the lesion was well covered. It was initially deployed and 15 atmospheres  for 60 seconds and a final inflation was 15 atmospheres for 65 seconds. The  area had  been eccentrically narrowed at 80% preintervention now appeared to  be normal with brisk TIMI III flow. There was no evidence of any dissection  or thrombus. The patient was given some intracoronary nitroglycerin because  of some mild coronary spasm distal to the stent. This resolved.   Evaluation of the LAD showed mild irregularities in the proximal and  midportion and the distal LAD was diffusely diseased with 50-60% areas of  narrowing, a relatively small distal vessel and not amenable to any type of  percutaneous intervention.   The patient was given a total of 3600 units of IV heparin, was started on  double bolus Integrilin drip and I plan to continue the Integrilin for 18  additional hours. She was given 600 milligrams of oral Plavix, 20 milligrams  of IV Pepcid and received intracoronary nitroglycerin after the stent was  placed in the RCA and after the stent was placed in the circumflex.   She should be ready for discharge in the morning. She will  need to be on  Plavix for a minimum of three months. I did use nondrug-eluting stents.           ______________________________  Thereasa Solo. Little, M.D.     ABL/MEDQ  D:  09/17/2005  T:  09/18/2005  Job:  [id number]   cc:   Cardiac Catheter Lab   Sharlot Gowda, M.D.  Fax: [phone]

## 2011-01-11 NOTE — Assessment & Plan Note (Signed)
[facility]                               PULMONARY OFFICE NOTE   DARNELLE, DERRICK                     MRN:          [id number]  DATE:03/18/2006                            DOB:          [birth year]    REASON FOR CONSULTATION:  Cough.   HISTORY OF PRESENT ILLNESS:  An 77 year old black female with a cough dating  back at least 3 months, status post multiple cardiac interventions, which  she says included a heart valve with a persistent, day greater than night  cough, productive of mucous like something draining down the back of the  throat associated with hoarseness but no significant dyspnea, orthopnea,  PND, exertional chest pain, orthopnea, or leg swelling. 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 She has, however, been on ace inhibitors  chronically (see comments below.)   PAST MEDICAL HISTORY:  Significant for hypertension, ischemic heart disease,  sleep apnea.   ALLERGIES:  NO KNOWN DRUG ALLERGIES.   MEDICATIONS:  Include Lovastatin, Plavix, Lisinopril, Toprol, and  hydrochlorothiazide.   The patient says that she has had several different antibiotics that did not  help. She said that Christiane Ha worked better than anything else to  control the cough.   SOCIAL HISTORY:  She denies ever smoking and also denies any new travel or  hobby exposure.   FAMILY HISTORY:  Is taken in detail. Significant for sister with asthma.   REVIEW OF SYSTEMS:  Taken in detail on the worksheet and significant for the  problems outlined above.   PHYSICAL EXAMINATION:  GENERAL:  This is a hoarse black female with frequent  throat clearing, classic voice and classic voice fatigue.  HEENT:  Remarkably unremarkable. Nasal and  throat are normal. Oropharynx is  clear with __________ excessive postnasal changes, cobblestoning. Nasal  turbinates reveal no cyanosis, pallor, polyps, or other evidence of chronic  rhinitis. Ear canals are clear bilaterally.  NECK:  Supple without cervical adenopathy or tenderness. Trachea is midline.  LUNGS:  Fields reveal classic pseudo-wheeze only and this was relatively  mild. __________ was excellent with no cough elicited on inspiratory or  expiratory maneuvers.  HEART:  There is regular rhythm with a 1 over 6 systolic murmur.  ABDOMEN:  Soft, obese, benign. Moderately __________.  EXTREMITIES:  Warm without calf tenderness, cyanosis, or clubbing.   LABORATORY DATA:  Chest x-ray was done within the last month and is reported  to be normal but not available at this time of this dictation.   IMPRESSION:  Classic upper airway cough, day greater than night, associated  with hoarseness and voice fatigue. Is almost certainly reflux or ace  inhibitor related. Using the strategy that less medicine is more, instead of  empirically adding treatment for reflux, I am going to ask that Dr. Clarene Duke  consider eliminating ace inhibitors for a  6 week trial and if this does not  eliminate the cough, then consider a 6 week course of PPI therapy in the  form of Prilosec 20 mg daily. In the meantime, I have reviewed with her a  reflux diet.   My own perspective is that ace inhibitors only fuel the fire of upper  airway inflammation that is present from another reason. Another reason, in  this case, I suspect the patient does have underlying reflux or rhinitis but  would like to see to what extent she improves with elimination of ace  inhibitors before adding additional medicines or doing additional studies.  Pulmonary followup can therefore be p.r.n.                                   Charlaine Dalton. Sherene Sires, MD, [facility]   MBW/MedQ  DD:  03/18/2006  DT:  03/18/2006  Job #:  [id number]   cc:   Sharlot Gowda, MD  Thereasa Solo. Little, MD

## 2011-01-11 NOTE — Cardiovascular Report (Signed)
[name], [name]              ACCOUNT NO.:  [id number]   MEDICAL RECORD NO.:  [id number]          PATIENT TYPE:  OIB   LOCATION:  2869                         FACILITY:  [facility]   PHYSICIAN:  Thereasa Solo. Little, M.D. DATE OF BIRTH:  10/27/1923   DATE OF PROCEDURE:  05/27/2005  DATE OF DISCHARGE:                              CARDIAC CATHETERIZATION   INDICATIONS FOR TEST:  This 77 year old hypertensive female presents with  episodes of chest discomfort and palpitations.  A nuclear study was  positive, showing a hyperdynamic left ventricle but anterior and lateral  ischemic changes.  She is brought in for outpatient cardiac catheterization.   She has known peripheral vascular disease, had stent in her right renal  artery, and has had SFA atherectomy by Dr. Guinevere Scarlet.   After obtaining informed consent, the patient was prepped and draped in the  usual sterile fashion exposing the right groin.  Following a local  anesthetic with 1% Xylocaine, the Seldinger technique was employed and a 5-  [place] introducer sheath was placed into the right femoral artery.  Left and  right coronary arteriography, percutaneous intervention to the RCA,  ventriculography and a distal aortogram were performed.   COMPLICATIONS:  None.   EQUIPMENT:  5-French diagnostic configuration catheters.   INTERVENTIONAL EQUIPMENT:  See below.   TOTAL CONTRAST USED:  225 mL.   RESULTS:  1.  Hemodynamic monitoring:  Her central aortic pressure was initially      220/108. She was given 20 mg of IV labetalol and her pressure reduced to      160/70.  Her left ventricular pressure was 164/4 with no significant      valve gradient noted at the time of pullback.  2.  Ventriculography in the RAO projection using 20 mL of contrast revealed      a markedly hyperdynamic left ventricle with left ventricular      hypertrophy.  The ejection fraction greater than 70%.  The end-diastolic      pressure was 18.  3.  Distal  aortogram:  Distal aortogram done just above the level of the      renal revealed on fluoroscopy the stent in the right renal artery.  A      questionable 80% in-stent restenosis, but there was some overlap of the      vessels.  The left renal artery clearly had an area of 70% narrowing      proximally.  The infrarenal aorta had mild irregularities but no      discrete abdominal aortic aneurysm.   CORONARY ARTERIOGRAPHY:  On fluoroscopy, dense calcification was seen in the  left main LAD area.   1.  Left main with mild minor irregularities, it bifurcated.  2.  Circumflex:  The circumflex had mild irregularities in itself and gave      rise to one large OM vessel.  The proximal portion of the OM vessel had      an area of 70% narrowing that was in a bend in the proximal portion of      the vessel.  It was difficult to lay  but the best seen in the LAO views.      There was also some concern of 40%+ obstruction of the ostium of the      circumflex, but this did not show up in a spider shot.  3.  LAD.  The LAD had mild irregularities proximally.  The distal LAD was      small, diffusely diseased and irregular.  The diagonals were small.  4.  Right coronary artery:  The right coronary artery had a focal area of      90% narrowing.  It almost appeared to be like a napkin ring-type lesion.      There were mild irregularities proximal and distal to this, and the PDA      and the PL also had mild irregularities.   Because of the high-grade stenosis in the RCA, arrangements were made for  intervention.  Her pressure had been elevated and I opted these Angiomax  only because of concern of prolonged anticoagulation in a severely  hypertensive patient.   The 5-French sheath was exchanged out for a 6-French sheath.  A JR-3.5 with  side holes guide catheter was used.  A short Luge wire was used and placed  well distal to the lesion.  Initially a Voyager 1.5 x 12 angioplasty balloon  was placed in  the area of the obstruction.  Two inflations,  6 x 40 and 10 x  40, were performed.  With this, there still appeared to be a greater than  60% area of narrowing.   A 2.5 x 12 mini Vision stent was then placed in such a manner that both the  proximal and distal portions of the area of narrowing were well covered.  It  was initially deployed at 10 atmospheres for 55 seconds, with the final  inflation being 12 x 50 seconds.  The 90% area of narrowing pre-intervention  now appeared to be normal.  There was no evidence of any dissection or  thrombus formation.  There was actually TIMI-III flow pre and post.   The area distal to the stent which had been noticed prior to have  irregularities now had clear 30% areas of narrowing, and I suspect it is  related to the increased blood flow.  There was no change in this area with  intracoronary nitroglycerin.   In the catheterization lab the patient was given 60 mg of p.o. Plavix.   She will need to be brought back for percutaneous intervention to her OM a  later date, but that at least is not critical.  She will also need to be  reevaluated by Dr. Orson Slick for intervention to both her renal arteries.  This  should help considerably in management of her severe hypertension.  One must  avoid ACE and ARBs until the renal artery stenosis is resolved.           ______________________________  Thereasa Solo Little, M.D.     ABL/MEDQ  D:  05/27/2005  T:  05/27/2005  Job:  [id number]   cc:   Sharlot Gowda, M.D.  Fax: [phone]   Cardiac Catheterization Lab

## 2011-01-17 ENCOUNTER — Other Ambulatory Visit: Payer: Self-pay

## 2011-01-28 ENCOUNTER — Other Ambulatory Visit (INDEPENDENT_AMBULATORY_CARE_PROVIDER_SITE_OTHER): Payer: Medicare Other

## 2011-01-28 DIAGNOSIS — I6529 Occlusion and stenosis of unspecified carotid artery: Secondary | ICD-10-CM

## 2011-02-05 NOTE — Procedures (Unsigned)
CAROTID DUPLEX EXAM  INDICATION:  Carotid artery disease.  HISTORY: Diabetes:  No. Cardiac:  MI 2005. Hypertension:  Yes. Smoking:  No. Previous Surgery:  No. CV History:  TIA 16 years ago. Amaurosis Fugax No, Paresthesias No, Hemiparesis No                                      RIGHT             LEFT Brachial systolic pressure:         149               152 Brachial Doppler waveforms:         Normal            Normal Vertebral direction of flow:        Antegrade         Antegrade DUPLEX VELOCITIES (cm/sec) CCA peak systolic                   49                63 ECA peak systolic                   51                60 ICA peak systolic                   186               89 ICA end diastolic                   51                33 PLAQUE MORPHOLOGY:                  Mixed             Mixed PLAQUE AMOUNT:                      Moderate          Mild PLAQUE LOCATION:                    ICA               ICA, ECA  IMPRESSION: 1. Right internal carotid artery velocity suggests 40%-59% stenosis. 2. Left internal carotid artery velocity suggests 1%-39% stenosis. 3. Antegrade vertebral arteries bilaterally. 4. Velocities may be underestimated due to acoustic shadowing.  ___________________________________________ Quita Skye. Hart Rochester, M.D.  EM/MEDQ  D:  01/29/2011  T:  01/29/2011  Job:  [id number]

## 2011-04-17 ENCOUNTER — Other Ambulatory Visit: Payer: Self-pay | Admitting: Family Medicine

## 2011-06-13 ENCOUNTER — Other Ambulatory Visit: Payer: Self-pay | Admitting: Family Medicine

## 2011-06-13 NOTE — Telephone Encounter (Signed)
Is this okay to refill? 

## 2011-06-13 NOTE — Telephone Encounter (Signed)
REfill request

## 2011-06-27 ENCOUNTER — Ambulatory Visit
Admission: RE | Admit: 2011-06-27 | Discharge: 2011-06-27 | Disposition: A | Discharge status: Home / Self Care | Payer: Medicare Other | Source: Ambulatory Visit | Attending: Cardiovascular Disease | Admitting: Cardiovascular Disease

## 2011-06-27 ENCOUNTER — Other Ambulatory Visit: Payer: Self-pay | Admitting: Cardiovascular Disease

## 2011-06-27 DIAGNOSIS — Z01811 Encounter for preprocedural respiratory examination: Secondary | ICD-10-CM

## 2011-06-27 DIAGNOSIS — I2510 Atherosclerotic heart disease of native coronary artery without angina pectoris: Secondary | ICD-10-CM

## 2011-06-27 DIAGNOSIS — I270 Primary pulmonary hypertension: Secondary | ICD-10-CM

## 2011-07-01 ENCOUNTER — Encounter (HOSPITAL_COMMUNITY): Payer: Self-pay

## 2011-07-04 ENCOUNTER — Encounter (HOSPITAL_COMMUNITY)
Admission: RE | Disposition: A | Discharge status: Home / Self Care | Payer: Self-pay | Source: Ambulatory Visit | Attending: Cardiovascular Disease

## 2011-07-04 ENCOUNTER — Encounter (HOSPITAL_COMMUNITY): Payer: Self-pay | Admitting: Cardiovascular Disease

## 2011-07-04 ENCOUNTER — Ambulatory Visit (HOSPITAL_COMMUNITY)
Admission: RE | Admit: 2011-07-04 | Discharge: 2011-07-05 | Disposition: A | Discharge status: Home / Self Care | Payer: Medicare Other | Source: Ambulatory Visit | Attending: Cardiovascular Disease | Admitting: Cardiovascular Disease

## 2011-07-04 DIAGNOSIS — I359 Nonrheumatic aortic valve disorder, unspecified: Secondary | ICD-10-CM | POA: Insufficient documentation

## 2011-07-04 DIAGNOSIS — E876 Hypokalemia: Secondary | ICD-10-CM | POA: Insufficient documentation

## 2011-07-04 DIAGNOSIS — Y831 Surgical operation with implant of artificial internal device as the cause of abnormal reaction of the patient, or of later complication, without mention of misadventure at the time of the procedure: Secondary | ICD-10-CM | POA: Insufficient documentation

## 2011-07-04 DIAGNOSIS — I739 Peripheral vascular disease, unspecified: Secondary | ICD-10-CM | POA: Diagnosis present

## 2011-07-04 DIAGNOSIS — I70219 Atherosclerosis of native arteries of extremities with intermittent claudication, unspecified extremity: Secondary | ICD-10-CM | POA: Insufficient documentation

## 2011-07-04 DIAGNOSIS — E785 Hyperlipidemia, unspecified: Secondary | ICD-10-CM | POA: Insufficient documentation

## 2011-07-04 DIAGNOSIS — I2510 Atherosclerotic heart disease of native coronary artery without angina pectoris: Secondary | ICD-10-CM | POA: Insufficient documentation

## 2011-07-04 DIAGNOSIS — T82898A Other specified complication of vascular prosthetic devices, implants and grafts, initial encounter: Secondary | ICD-10-CM | POA: Insufficient documentation

## 2011-07-04 DIAGNOSIS — I10 Essential (primary) hypertension: Secondary | ICD-10-CM | POA: Insufficient documentation

## 2011-07-04 HISTORY — PX: LOWER EXTREMITY ANGIOGRAM: SHX5508

## 2011-07-04 HISTORY — DX: Chronic kidney disease, unspecified: N18.9

## 2011-07-04 HISTORY — PX: POPLITEAL ARTERY ANGIOPLASTY: SHX2242

## 2011-07-04 HISTORY — DX: Cerebral infarction, unspecified: I63.9

## 2011-07-04 HISTORY — DX: Peripheral vascular disease, unspecified: I73.9

## 2011-07-04 HISTORY — DX: Atherosclerotic heart disease of native coronary artery without angina pectoris: I25.10

## 2011-07-04 HISTORY — DX: Essential (primary) hypertension: I10

## 2011-07-04 LAB — POCT ACTIVATED CLOTTING TIME: Activated Clotting Time: 215 seconds

## 2011-07-04 SURGERY — ANGIOGRAM, LOWER EXTREMITY
Anesthesia: LOCAL | Laterality: Right

## 2011-07-04 MED ORDER — CLOPIDOGREL BISULFATE 75 MG PO TABS: 75.0000 mg | ORAL_TABLET | Freq: Every day | ORAL | Status: DC

## 2011-07-04 MED ORDER — SODIUM CHLORIDE 0.9 % IV SOLN
INTRAVENOUS | Status: AC
  Administered 2011-07-04 (×2): via INTRAVENOUS

## 2011-07-04 MED ORDER — MORPHINE SULFATE 2 MG/ML IJ SOLN
2.0000 mg | INTRAMUSCULAR | Status: DC | PRN
  Administered 2011-07-04: 2 mg via INTRAVENOUS
  Filled 2011-07-04: qty 1

## 2011-07-04 MED ORDER — CLOPIDOGREL BISULFATE 75 MG PO TABS
75.0000 mg | ORAL_TABLET | Freq: Every day | ORAL | Status: DC
  Filled 2011-07-04: qty 1

## 2011-07-04 MED ORDER — ACETAMINOPHEN 325 MG PO TABS: 650.0000 mg | ORAL_TABLET | ORAL | Status: DC | PRN

## 2011-07-04 MED ORDER — CLONIDINE HCL 0.1 MG PO TABS
0.1000 mg | ORAL_TABLET | Freq: Two times a day (BID) | ORAL | Status: DC
  Administered 2011-07-04: 0.1 mg via ORAL
  Filled 2011-07-04 (×3): qty 1

## 2011-07-04 MED ORDER — FLUOROMETHOLONE 0.1 % OP SUSP
1.0000 [drp] | Freq: Two times a day (BID) | OPHTHALMIC | Status: DC
  Administered 2011-07-04: 1 [drp] via OPHTHALMIC
  Filled 2011-07-04: qty 5

## 2011-07-04 MED ORDER — IBUPROFEN 800 MG PO TABS
800.0000 mg | ORAL_TABLET | Freq: Two times a day (BID) | ORAL | Status: DC | PRN
  Filled 2011-07-04: qty 1

## 2011-07-04 MED ORDER — INFLUENZA VIRUS VACC SPLIT PF IM SUSP
0.5000 mL | INTRAMUSCULAR | Status: DC
  Filled 2011-07-04: qty 0.5

## 2011-07-04 MED ORDER — HYDRALAZINE HCL 20 MG/ML IJ SOLN
10.0000 mg | Freq: Once | INTRAMUSCULAR | Status: AC
  Administered 2011-07-04: 10 mg via INTRAVENOUS
  Filled 2011-07-04: qty 0.5

## 2011-07-04 MED ORDER — DIAZEPAM 5 MG PO TABS
5.0000 mg | ORAL_TABLET | ORAL | Status: AC
  Administered 2011-07-04: 5 mg via ORAL

## 2011-07-04 MED ORDER — SIMVASTATIN 40 MG PO TABS
40.0000 mg | ORAL_TABLET | Freq: Every day | ORAL | Status: DC
  Administered 2011-07-04: 40 mg via ORAL
  Filled 2011-07-04 (×2): qty 1

## 2011-07-04 MED ORDER — ONDANSETRON HCL 4 MG/2ML IJ SOLN: 4.0000 mg | Freq: Four times a day (QID) | INTRAMUSCULAR | Status: DC | PRN

## 2011-07-04 MED ORDER — DIAZEPAM 5 MG PO TABS
ORAL_TABLET | ORAL | Status: AC
  Filled 2011-07-04: qty 1

## 2011-07-04 MED ORDER — DILTIAZEM HCL 60 MG PO TABS
60.0000 mg | ORAL_TABLET | Freq: Two times a day (BID) | ORAL | Status: DC
  Administered 2011-07-04: 60 mg via ORAL
  Filled 2011-07-04 (×3): qty 1

## 2011-07-04 MED ORDER — FENTANYL CITRATE 0.05 MG/ML IJ SOLN
INTRAMUSCULAR | Status: AC
  Filled 2011-07-04: qty 2

## 2011-07-04 MED ORDER — HEPARIN SODIUM (PORCINE) 1000 UNIT/ML IJ SOLN
INTRAMUSCULAR | Status: AC
  Filled 2011-07-04: qty 1

## 2011-07-04 MED ORDER — SODIUM CHLORIDE 0.9 % IV SOLN
INTRAVENOUS | Status: DC
  Administered 2011-07-04: 10:00:00 via INTRAVENOUS

## 2011-07-04 MED ORDER — LISINOPRIL 20 MG PO TABS
20.0000 mg | ORAL_TABLET | Freq: Two times a day (BID) | ORAL | Status: DC
  Administered 2011-07-04: 20 mg via ORAL
  Filled 2011-07-04 (×3): qty 1

## 2011-07-04 MED ORDER — CARVEDILOL 25 MG PO TABS
25.0000 mg | ORAL_TABLET | Freq: Two times a day (BID) | ORAL | Status: DC
  Administered 2011-07-04: 25 mg via ORAL
  Filled 2011-07-04 (×4): qty 1

## 2011-07-04 MED ORDER — HEPARIN (PORCINE) IN NACL 2-0.9 UNIT/ML-% IJ SOLN
INTRAMUSCULAR | Status: AC
  Filled 2011-07-04: qty 1000

## 2011-07-04 NOTE — Progress Notes (Signed)
Site area: left groin  Site Prior to Removal:  Level 0  Pressure Applied For 30 MINUTES    Minutes Beginning at 1640  Manual:   yes  Patient Status During Pull:  stable  Post Pull Groin Site:  Level 0  Post Pull Instructions Given:  yes  Post Pull Pulses Present:  yes  Dressing Applied:  yes  Signature:  Marijo Conception, RN, BSN, BS  Comments:  Note blood pressure elevated after sheath pulled and remained elevated. Held pressure 10 extra minutes. Given hydralazine when order received. Dressing applied and checked q 5 minutes until pressure decreased.

## 2011-07-04 NOTE — Brief Op Note (Signed)
07/04/2011  12:46 PM  PATIENT:  [name]  77 y.o. female  PRE-OPERATIVE DIAGNOSIS:  Claudication  POST-OPERATIVE DIAGNOSIS:  * No post-op diagnosis entered *  PROCEDURE:  Procedure(s): LOWER EXTREMITY ANGIOGRAM PTA FEMORAL POPLITEAL ARTERY  SURGEON:  Surgeon(s): Runell Gess, MD [id number] BJY#[id number]#[id number]  Nanetta Batty J 07/04/2011 12:56 PM

## 2011-07-04 NOTE — Procedures (Signed)
[name], BECKSTROM NO.:  [id number]  MEDICAL RECORD NO.:  [id number]  LOCATION:  [facility]                         FACILITY:  [facility]  PHYSICIAN:  Nanetta Batty, M.D.   DATE OF BIRTH:  1924/06/07  DATE OF PROCEDURE: DATE OF DISCHARGE:                   PERIPHERAL VASCULAR INVASIVE PROCEDURE   [name] is a 77 year old mildly overweight widowed African American female, mother of 5 living children and grandmother of 15 grandchildren who I saw in the office on June 27, 2011.  She has a history of CAD status post multiple interventions in the past.  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 Yates Decamp 2-1/2 years ago. The patient has hypertension, hyperlipidemia as well.  She denies chest pain or shortness of breath.  She also has mild aortic stenosis with valve area of 1.1 cm squared 2 years ago.  She has complained of right lower extremity claudication in the left.  Dopplers revealed right ABI of 0.75, left of 0.6 with high-frequency signals in the distal right SFA.  She presents now for abdominal aortography with bifemoral runoff using bolus chase digital subtraction step table technique.  PROCEDURE DESCRIPTION:  The patient was brought to the [facility] in the postabsorptive state.  She was premedicated with p.o.  Valium and IV fentanyl.  Her left groin was prepped and shaved in the usual sterile fashion.  1% Xylocaine was used for local anesthesia.  A 5-French sheath was inserted into the left femoral artery using standard Seldinger technique.  A 5-French pigtail catheter was used for abdominal aortography with bifemoral runoff.  A 5- [place] crossover catheter was used, Administrator, sports and an angled glide wire to obtain contralateral access.  A 6-French destination sheath was used for intervention.  ANGIOGRAPHIC RESULTS: 1. Abdominal  aorta .     a.     Renal arteries - widely patent stents bilaterally.     b.     Infrarenal abdominal aorta - normal. 2. Left lower extremity.     a.     Short segment occlusion mid left SFA with one-vessel runoff      via the peroneal. 3. Right lower extremity.     a.     Patent long right SFA stent with diffuse in-stent restenosis      in the proximal, mid, and distal portion.  There were focal areas      up to 90% to 95% especially in the mid to distal segment.  This is      a 50% popliteal lesion with one-vessel runoff via the peroneal.  IMPRESSION:  High-grade in-stent restenosis in the mid right SFA stent with lifestyle-limiting claudication.  We will proceed with PTA.  The patient received 5000 units of heparin intravenously with an ACT of 215.  Total contrast administered to the patient was 197.  The SFA segment was traversed with a 0.35 angled glide wire and a 5 x 120 [place] balloon.  This was then exchanged for Versacore and sequential inflations were performed at 3 atmospheres throughout the entirety of the stented segment, reducing focal 95% stenosis  to less than 20%, as well as 70% diffuse in-stent restenosis to less than 20%.  Completion angiography revealed a patent peroneal expressive prior to the case as well.  IMPRESSION:  Successful PTA of diffuse in-stent restenosis within the right SFA stent for lifestyle-limiting claudication.  The sheath was then withdrawn across the bifurcation and secured in place.  The patient left the lab in stable condition.  Sheath will be removed once the ACT falls below 170 and pressure will be held in groin to achieve hemostasis.  The patient will be hydrated overnight, discharged home in the morning.  She will get followup Dopplers and ABIs in my office and will see me back in followup.     Nanetta Batty, M.D.     JB/MEDQ  D:  07/04/2011  T:  07/04/2011  Job:  [id number]  cc:   [facility] Thereasa Solo. Little, M.D. Sharlot Gowda, M.D.

## 2011-07-04 NOTE — Clinical Documentation Improvement (Signed)
Patient informed of staff making hourly rounds per Hospital protocol Informed patient of non smoking facility

## 2011-07-04 NOTE — H&P (Signed)
  Agree with note written by Jeannetta Nap written in office 06/27/11 that will be scanned into the system  BERRY,JONATHAN J 07/04/2011 11:40 AM

## 2011-07-05 ENCOUNTER — Encounter (HOSPITAL_COMMUNITY): Payer: Self-pay

## 2011-07-05 LAB — BASIC METABOLIC PANEL
CO2: 26 mEq/L (ref 19–32)
Chloride: 106 mEq/L (ref 96–112)
Glucose, Bld: 127 mg/dL — ABNORMAL HIGH (ref 70–99)
Potassium: 3.2 mEq/L — ABNORMAL LOW (ref 3.5–5.1)
Sodium: 142 mEq/L (ref 135–145)

## 2011-07-05 LAB — CBC
HCT: 33.3 % — ABNORMAL LOW (ref 36.0–46.0)
Hemoglobin: 10.7 g/dL — ABNORMAL LOW (ref 12.0–15.0)
MCH: 31.9 pg (ref 26.0–34.0)
MCV: 99.4 fL (ref 78.0–100.0)
Platelets: 139 10*3/uL — ABNORMAL LOW (ref 150–400)
RBC: 3.35 MIL/uL — ABNORMAL LOW (ref 3.87–5.11)
WBC: 5.1 10*3/uL (ref 4.0–10.5)

## 2011-07-05 MED ORDER — POTASSIUM CHLORIDE CRYS ER 20 MEQ PO TBCR
40.0000 meq | EXTENDED_RELEASE_TABLET | Freq: Once | ORAL | Status: DC
  Filled 2011-07-05: qty 2

## 2011-07-05 NOTE — Progress Notes (Signed)
Subjective: Denies groin pain, chest pain, Sob.  No constipation. Stated less leg pain with movement ( Rt. Leg)  Objective: Vital signs in last 24 hours: Temp:  [97 F (36.1 C)-99.5 F (37.5 C)] 99.5 F (37.5 C) (11/09 0807) Pulse Rate:  [55-72] 66  (11/09 0700) Resp:  [12-23] 20  (11/09 0700) BP: (122-183)/(50-86) 145/63 mmHg (11/09 0700) SpO2:  [92 %-96 %] 96 % (11/09 0700) Weight:  [71.668 kg (158 lb)] 158 lb (71.668 kg) (11/08 0918) Weight change:  Last BM Date: 07/03/11 Intake/Output from previous day: 11/08 0701 - 11/09 0700 In: 712.5 [P.O.:275; I.V.:437.5] Out: -  Intake/Output this shift:    PE: GENERAL: Alert, oriented X3. LUNGS:  Clear without rales, rhonchi, or wheezes. HEART:  S1,S2, RRR  II/VI harsh systolic murmur. ABD: Pos. Bowel sounds, soft, non-tender. EXT: Lt. Groin without hematoma. 1+ rt. Pedal. Bilateral LE warm with good pulses.   Lab Results:  [facility] 07/05/11 0405  WBC 5.1  HGB 10.7*  HCT 33.3*  PLT 139*   BMET  Basename 07/05/11 0405  NA 142  K 3.2*  CL 106  CO2 26  GLUCOSE 127*  BUN 17  CREATININE 1.13*  CALCIUM 9.9   No results found for this basename: TROPONINI:2,CK,MB:2 in the last 72 hours  Lab Results  Component Value Date   CHOL  Value: 157        ATP III CLASSIFICATION:  <200     mg/dL   Desirable  409-811  mg/dL   Borderline High  >=914    mg/dL   High        78/29/5621   HDL 42 06/05/2010   LDLCALC  Value: 78        Total Cholesterol/HDL:CHD Risk Coronary Heart Disease Risk Table                     Men   Women  1/2 Average Risk   3.4   3.3  Average Risk       5.0   4.4  2 X Average Risk   9.6   7.1  3 X Average Risk  23.4   11.0        Use the calculated Patient Ratio above and the CHD Risk Table to determine the patient's CHD Risk.        ATP III CLASSIFICATION (LDL):  <100     mg/dL   Optimal  308-657  mg/dL   Near or Above                    Optimal  130-159  mg/dL   Borderline  846-962  mg/dL   High  >952     mg/dL    Very High 84/13/2440   TRIG 183* 06/05/2010   CHOLHDL 3.7 06/05/2010   Lab Results  Component Value Date   HGBA1C  Value: 6.0 (NOTE)                                                                       According to the ADA Clinical Practice Recommendations for 2011, when HbA1c is used as a screening test:   >=6.5%   Diagnostic of Diabetes Mellitus           (  if abnormal result  is confirmed)  5.7-6.4%   Increased risk of developing Diabetes Mellitus  References:Diagnosis and Classification of Diabetes Mellitus,Diabetes Care,2011,34(Suppl 1):S62-S69 and Standards of Medical Care in         Diabetes - 2011,Diabetes Care,2011,34  (Suppl 1):S11-S61.* 05/26/2010     Lab Results  Component Value Date   TSH 1.053 05/26/2010    Hepatic Function Panel No results found for this basename: PROT,ALBUMIN,AST,ALT,ALKPHOS,BILITOT,BILIDIR,IBILI in the last 72 hours No results found for this basename: CHOL in the last 72 hours No results found for this basename: PROTIME in the last 72 hours    EKG:No orders found for this or any previous visit.  Studies/Results: No results found.  Medications: I have reviewed the patient's current medications.  Assessment/Plan: Patient Active Problem List  Diagnoses  . Claudication  . PAD (peripheral artery disease)  . CAD (coronary artery disease)  . HTN (hypertension)   PLAN: Ambulate, then D/C home.  Outpatient lower ext. Arterial dopplers.  Follow up with Dr. Allyson Sabal.   LOS: 1 day   INGOLD,LAURA R 07/05/2011, 8:15 AM  Patient seen and examined. Agree with assessment and plan. Hypokalemia with K 3.2; K replete.   KELLY,THOMAS A 07/05/2011 8:45 AM

## 2011-07-05 NOTE — Discharge Summary (Signed)
Physician Discharge Summary  Patient ID: [name] MRN: [id number] DOB/AGE: 04/20/1924 77 y.o.  Admit date: 07/04/2011 Discharge date: 07/05/2011  Discharge Diagnoses:  Principal Problem:  *Claudication Active Problems:  PAD (peripheral artery disease)  CAD (coronary artery disease)  HTN (hypertension)   Discharged Condition: good  Hospital Course: 77 year old African American female came in electively to [facility] for elective PV angiogram by Dr. Allyson Sabal. She has a history of peripheral arterial disease including right SFA stent placed by Dr. Jacinto Halim two and a half years ago. She had been complaining of increased lifestyle limiting claudication in the right leg. Also Dopplers revealed high-frequency signal in the distal right SFA and a right ABI of 0.75 and a left ABI of 0.6. She now presents for angiogram.  Other history does include coronary artery disease with multiple interventions in the past. She has a history of PTA of renal artery in 2006. Additionally she has aortic stenosis with a valve area of 1.1 cm square. She has hypertension that is controlled as an outpatient as well.  Patient presented and underwent PV angiogram and was found to have restenosis in the stent in the right SFA undergoing angioplasty to that site. Post procedure she did well without complaints and by the morning of 07/05/2011 she ambulated with improvement of her right leg pain and was ready for discharge home.  She was mildly hypokalemic and we gave her 40 of potassium prior to discharge. She'll follow with Dr. Allyson Sabal after lower extremity arterial Dopplers are completed for further management.  Consults: none  Significant Diagnostic Studies:  PV angiogram as described per Dr. Hazle Coca note.  Hemoglobin 10.7 hematocrit 33.3 during the C5 0.1 and platelets 139 at discharge.  Sodium 142 potassium 3.2 chloride 106 CO2 26 glucose 127 BUN 17 creatinine 1.13 at discharge.    Discharge  Exam: Blood pressure 145/63, pulse 66, temperature 99.5 F (37.5 C), temperature source Oral, resp. rate 20, height 5\' 4"  (1.626 m), weight 71.668 kg (158 lb), SpO2 96.00%. Exam at discharge has not changed from a.m. Exam please see the note of 07/05/2011.  Disposition:   Discharge Orders    Future Appointments: Provider: Department: Dept Phone: Center:   01/29/2012 2:30 PM [facility] [phone] [facility]   01/29/2012 3:40 PM [facility] [phone] [facility]     Current Discharge Medication List    CONTINUE these medications which have NOT CHANGED   Details  carvedilol (COREG) 25 MG tablet Take 25 mg by mouth 2 (two) times daily with a meal.      cloNIDine (CATAPRES) 0.1 MG tablet Take 0.1 mg by mouth 2 (two) times daily.      clopidogrel (PLAVIX) 75 MG tablet Take 75 mg by mouth daily.      diltiazem (CARDIZEM) 60 MG tablet Take 60 mg by mouth 2 (two) times daily.      ibuprofen (ADVIL,MOTRIN) 800 MG tablet Take 800 mg by mouth 2 (two) times daily as needed. For pain.     lisinopril (PRINIVIL,ZESTRIL) 20 MG tablet Take 20 mg by mouth 2 (two) times daily.      simvastatin (ZOCOR) 40 MG tablet Take 40 mg by mouth daily.      fluorometholone (FML) 0.1 % ophthalmic suspension Place 1 drop into both eyes 2 (two) times daily.         Follow-up Information    Follow up with Runell Gess, MD in 1 week. (The office will call you with date and time  for both a follow-up with Dr. Allyson Sabal and Lower Extremity arterial dopplers.)    Contact information:   [address]  [place] [address] [phone]          Signed: Leone Brand 07/05/2011, 9:51 AM

## 2011-07-23 HISTORY — PX: OTHER SURGICAL HISTORY: SHX169

## 2011-08-26 ENCOUNTER — Other Ambulatory Visit: Payer: Self-pay | Admitting: Family Medicine

## 2011-09-03 ENCOUNTER — Ambulatory Visit (INDEPENDENT_AMBULATORY_CARE_PROVIDER_SITE_OTHER): Payer: Medicare Other | Admitting: Family Medicine

## 2011-09-03 ENCOUNTER — Telehealth: Payer: Self-pay

## 2011-09-03 ENCOUNTER — Encounter: Payer: Self-pay | Admitting: Family Medicine

## 2011-09-03 ENCOUNTER — Encounter: Payer: Self-pay | Admitting: Gastroenterology

## 2011-09-03 VITALS — BP 128/70 | HR 90 | Ht 63.0 in | Wt 156.0 lb

## 2011-09-03 DIAGNOSIS — I739 Peripheral vascular disease, unspecified: Secondary | ICD-10-CM

## 2011-09-03 DIAGNOSIS — I2510 Atherosclerotic heart disease of native coronary artery without angina pectoris: Secondary | ICD-10-CM

## 2011-09-03 DIAGNOSIS — R7309 Other abnormal glucose: Secondary | ICD-10-CM

## 2011-09-03 DIAGNOSIS — I10 Essential (primary) hypertension: Secondary | ICD-10-CM

## 2011-09-03 DIAGNOSIS — R7302 Impaired glucose tolerance (oral): Secondary | ICD-10-CM

## 2011-09-03 LAB — POCT URINALYSIS DIPSTICK
Bilirubin, UA: NEGATIVE
Clarity, UA: NEGATIVE
Ketones, UA: NEGATIVE

## 2011-09-03 LAB — COMPREHENSIVE METABOLIC PANEL
ALT: 10 U/L (ref 0–35)
CO2: 28 mEq/L (ref 19–32)
Calcium: 10 mg/dL (ref 8.4–10.5)
Chloride: 106 mEq/L (ref 96–112)
Creat: 1.53 mg/dL — ABNORMAL HIGH (ref 0.50–1.10)
Glucose, Bld: 140 mg/dL — ABNORMAL HIGH (ref 70–99)
Sodium: 143 mEq/L (ref 135–145)
Total Protein: 6.7 g/dL (ref 6.0–8.3)

## 2011-09-03 LAB — LIPID PANEL
Cholesterol: 164 mg/dL (ref 0–200)
Triglycerides: 116 mg/dL (ref <150)

## 2011-09-03 NOTE — Progress Notes (Signed)
  Subjective:    Patient ID: [name], female    DOB: [birth year], 78 y.o.   MRN: [id number]  HPI She is here for an interval evaluation. Her main complaint today is difficulty with bowel incontinence. She states that this has been a problem since she had her hemorrhoid surgery. It is intermittent in nature. She also has underlying coronary artery disease but at this time is not having any chest pain, shortness of breath, diaphoresis. She was seen recently by Dr. Allyson Sabal for evaluation of her PVD. She has had recent stenting done. She has no complaints of lower extremity discomfort. She continues on her blood pressure medications as well as Plavix. She has a previous history of glucose intolerance.   Review of Systems Negative except as above    Objective:   Physical Exam alert and in no distress. Tympanic membranes and canals are normal. Throat is clear. Tonsils are normal. Neck is supple without adenopathy or thyromegaly. Cardiac exam shows a regular sinus rhythm without murmurs or gallops. Lungs are clear to auscultation. Abdominal exam shows normal bowel sounds without hepatosplenomegaly. Rectal exam shows poor sphincter tone and guaiac-negative stool        Assessment & Plan:   1. PVD (peripheral vascular disease) with claudication    2. CAD (coronary artery disease)  CBC with Differential, Comprehensive metabolic panel, Lipid panel  3. HTN (hypertension)  CBC with Differential, Comprehensive metabolic panel, Lipid panel  4. Glucose intolerance (impaired glucose tolerance)  POCT HgB A1C, POCT Urinalysis Dipstick   Incontinence routine blood screening and I will refer to GI for further evaluation of her incontinence.

## 2011-09-03 NOTE — Telephone Encounter (Signed)
Pt asked if you would call her in some cough med she has had the cough for the past 6 weeks and would like some relief please advise

## 2011-09-04 LAB — CBC WITH DIFFERENTIAL/PLATELET
Eosinophils Relative: 4 % (ref 0–5)
Hemoglobin: 12 g/dL (ref 12.0–15.0)
Lymphocytes Relative: 33 % (ref 12–46)
Lymphs Abs: 1.9 10*3/uL (ref 0.7–4.0)
MCH: 31.9 pg (ref 26.0–34.0)
MCV: 99.7 fL (ref 78.0–100.0)
Monocytes Relative: 11 % (ref 3–12)
Platelets: 166 10*3/uL (ref 150–400)
RBC: 3.76 MIL/uL — ABNORMAL LOW (ref 3.87–5.11)
WBC: 5.8 10*3/uL (ref 4.0–10.5)

## 2011-09-05 ENCOUNTER — Telehealth: Payer: Self-pay

## 2011-09-05 NOTE — Telephone Encounter (Signed)
Tell her that we really did not address this issue when she was here the other day and have her set up an appointment. It's hard to say what would be the best medicine to give her for her cough

## 2011-09-05 NOTE — Telephone Encounter (Signed)
Pt called again requesting you to please to call in some cough med for her

## 2011-09-05 NOTE — Telephone Encounter (Signed)
Pt said she used some salve and cough so far gone away

## 2011-09-09 ENCOUNTER — Encounter: Payer: Self-pay | Admitting: Gastroenterology

## 2011-09-09 ENCOUNTER — Ambulatory Visit (INDEPENDENT_AMBULATORY_CARE_PROVIDER_SITE_OTHER): Payer: Medicare Other | Admitting: Gastroenterology

## 2011-09-09 VITALS — BP 212/102 | HR 64 | Ht 65.0 in | Wt 157.8 lb

## 2011-09-09 DIAGNOSIS — R159 Full incontinence of feces: Secondary | ICD-10-CM

## 2011-09-09 MED ORDER — PEG-KCL-NACL-NASULF-NA ASC-C 100 G PO SOLR: 1.0000 | Freq: Once | ORAL | Status: DC

## 2011-09-09 NOTE — Patient Instructions (Signed)
Your Colonoscopy is scheduled on 09/12/2011 at 2pm on the 4th floor you will need to arrive at 1pm We are waiting to hear back whether or not you can hold your Plavix from Dr Allyson Sabal

## 2011-09-09 NOTE — Progress Notes (Signed)
History of Present Illness: [name] is a pleasant 78 year old American female with history of coronary artery disease, peripheral vascular disease, CVA and DVT referred at the request of Dr. Susann Givens for evaluation of fecal incontinence. Since undergoing a surgical hemorrhoidectomy approximately 1 year ago she has complained of fecal incontinence. She is aware of passing small amounts of stool including liquid and solid stools. She is unable to control this. She has soiling of her underclothes. There is no history of bleeding. She denies abdominal or rectal pain. She is on Plavix.    Past Medical History  Diagnosis Date  . Peripheral vascular disease   . Coronary artery disease   . Heart murmur   . Chronic kidney disease 1964    renal artery left kidney stent  . Hypertension   . Stroke   . Myocardial infarction   . DVT (deep venous thrombosis)   . Aneurysm    Past Surgical History  Procedure Date  . Abdominal hysterectomy   . Bladder surgery     bladder tack  . Kidney surgery     right renal artery stent  . Coronary angioplasty with stent placement   . Leg stents   . Breast lumpectomy     left   family history includes Irritable bowel syndrome in her daughter.  There is no history of Colon cancer. Current Outpatient Prescriptions  Medication Sig Dispense Refill  . aspirin 81 MG tablet Take 81 mg by mouth as needed.       . carvedilol (COREG) 25 MG tablet TAKE 1 TABLET BY MOUTH TWICE A DAY  60 tablet  PRN  . cloNIDine (CATAPRES) 0.1 MG tablet Take 0.1 mg by mouth 2 (two) times daily.        . clopidogrel (PLAVIX) 75 MG tablet Take 75 mg by mouth daily.        Marland Kitchen diltiazem (CARDIZEM) 60 MG tablet Take 60 mg by mouth daily.       . fluorometholone (FML) 0.1 % ophthalmic suspension Place 1 drop into both eyes 2 (two) times daily.        Marland Kitchen ibuprofen (ADVIL,MOTRIN) 800 MG tablet Take 800 mg by mouth 2 (two) times daily as needed. For pain.       Marland Kitchen lisinopril (PRINIVIL,ZESTRIL) 20  MG tablet Take 20 mg by mouth 2 (two) times daily.        . Omega-3 Fatty Acids (FISH OIL) 300 MG CAPS Take 2 capsules by mouth 2 (two) times a week.      . simvastatin (ZOCOR) 40 MG tablet Take 40 mg by mouth daily.         Allergies as of 09/09/2011 - Review Complete 09/09/2011  Allergen Reaction Noted  . Codeine Rash 07/01/2011    reports that she has never smoked. She has never used smokeless tobacco. She reports that she does not use illicit drugs. Her alcohol history not on file.     Review of Systems: She complains of arthritic pain in her lower extremities Pertinent positive and negative review of systems were noted in the above HPI section. All other review of systems were otherwise negative.  Vital signs were reviewed in today's medical record Physical Exam: General: Well developed , well nourished, no acute distress Head: Normocephalic and atraumatic Eyes:  sclerae anicteric, EOMI Ears: Normal auditory acuity Mouth: No deformity or lesions Neck: Supple, no masses or thyromegaly Lungs: Clear throughout to auscultation Heart: Regular rate and rhythm; no murmurs, rubs or bruits Abdomen:  Soft, non tender and non distended. No masses, hepatosplenomegaly or hernias noted. Normal Bowel sounds Rectal: There no rectal masses. Rectal tone is normal. Stool is Hemoccult negative Musculoskeletal: Symmetrical with no gross deformities  Skin: No lesions on visible extremities Pulses:  Normal pulses noted Extremities: No clubbing, cyanosis, edema or deformities noted Neurological: Alert oriented x 4, grossly nonfocal Cervical Nodes:  No significant cervical adenopathy Inguinal Nodes: No significant inguinal adenopathy Psychological:  Alert and cooperative. Normal mood and affect

## 2011-09-09 NOTE — Assessment & Plan Note (Addendum)
This appears to be a postsurgical complication. There is no obvious perirectal disease by exam.  Recommendations #1 colonoscopy #2 if no abnormalities are seen then I would consider therapy with bulking agents. Failing that, she may be a candidate for injection therapy into the rectal wall with Bretta Bang

## 2011-09-10 ENCOUNTER — Telehealth: Payer: Self-pay | Admitting: *Deleted

## 2011-09-10 NOTE — Telephone Encounter (Signed)
Dr Arlyce Dice, This pt has a procedure with you on Thursday...she can hold her Plavix She took it last night but has not taken it today Will she be okay to continue with the procedure, you just saw her in the office on Monday

## 2011-09-10 NOTE — Telephone Encounter (Signed)
ok 

## 2011-09-12 ENCOUNTER — Ambulatory Visit (AMBULATORY_SURGERY_CENTER): Payer: Medicare Other | Admitting: Gastroenterology

## 2011-09-12 ENCOUNTER — Encounter: Payer: Self-pay | Admitting: Gastroenterology

## 2011-09-12 DIAGNOSIS — D126 Benign neoplasm of colon, unspecified: Secondary | ICD-10-CM

## 2011-09-12 DIAGNOSIS — R159 Full incontinence of feces: Secondary | ICD-10-CM

## 2011-09-12 DIAGNOSIS — K635 Polyp of colon: Secondary | ICD-10-CM

## 2011-09-12 DIAGNOSIS — K5730 Diverticulosis of large intestine without perforation or abscess without bleeding: Secondary | ICD-10-CM

## 2011-09-12 MED ORDER — SODIUM CHLORIDE 0.9 % IV SOLN: 500.0000 mL | INTRAVENOUS | Status: DC

## 2011-09-12 NOTE — Patient Instructions (Addendum)
High fiber diet/ fiber supplements daily Diverticulosis Diverticulosis is a common condition that develops when small pouches (diverticula) form in the wall of the colon. The risk of diverticulosis increases with age. It happens more often in people who eat a low-fiber diet. Most individuals with diverticulosis have no symptoms. Those individuals with symptoms usually experience abdominal pain, constipation, or loose stools (diarrhea). HOME CARE INSTRUCTIONS   Increase the amount of fiber in your diet as directed by your caregiver or dietician. This may reduce symptoms of diverticulosis.   Your caregiver may recommend taking a dietary fiber supplement.   Drink at least 6 to 8 glasses of water each day to prevent constipation.   Try not to strain when you have a bowel movement.   Your caregiver may recommend avoiding nuts and seeds to prevent complications, although this is still an uncertain benefit.   Only take over-the-counter or prescription medicines for pain, discomfort, or fever as directed by your caregiver.  FOODS WITH HIGH FIBER CONTENT INCLUDE:  Fruits. Apple, peach, pear, tangerine, raisins, prunes.   Vegetables. Brussels sprouts, asparagus, broccoli, cabbage, carrot, cauliflower, romaine lettuce, spinach, summer squash, tomato, winter squash, zucchini.   Starchy Vegetables. Baked beans, kidney beans, lima beans, split peas, lentils, potatoes (with skin).   Grains. Whole wheat bread, brown rice, bran flake cereal, plain oatmeal, white rice, shredded wheat, bran muffins.  SEEK IMMEDIATE MEDICAL CARE IF:   You develop increasing pain or severe bloating.   You have an oral temperature above 102 F (38.9 C), not controlled by medicine.   You develop vomiting or bowel movements that are bloody or black.  Document Released: 05/09/2004 Document Revised: 04/24/2011 Document Reviewed: 01/10/2010 [facility] Patient Information 2012 [place], [place].  Resume plavix in 5  days  Please follow all discharge instructions given to you by the recovery room nurse. If you have any questions or problems after discharge please call [phone]. You will receive a phone call in the am to see how you are doing and answer any questions you may have. Thank you for choosing [facility] for your health care needs.

## 2011-09-12 NOTE — Op Note (Signed)
[facility] [address] [place], [place]  [address]  COLONOSCOPY PROCEDURE REPORT  PATIENT:  [name], [name]  MR#:  [id number] BIRTHDATE:  06-13-1924, 87 yrs. old  GENDER:  female ENDOSCOPIST:  Barbette Hair. Arlyce Dice, MD REF. BY:  Sharlot Gowda, M.D. PROCEDURE DATE:  09/12/2011 PROCEDURE:  Colonoscopy with snare polypectomy ASA CLASS:  Class II INDICATIONS:  fecal incontinence MEDICATIONS:   MAC sedation, administered by CRNA propofol 160mg IV  DESCRIPTION OF PROCEDURE:   After the risks benefits and alternatives of the procedure were thoroughly explained, informed consent was obtained.  Digital rectal exam was performed and revealed no abnormalities.   The LB 180AL [id number] endoscope was introduced through the anus and advanced to the cecum, which was identified by both the appendix and ileocecal valve, without limitations.  The quality of the prep was good, using MoviPrep. The instrument was then slowly withdrawn as the colon was fully examined. <<PROCEDUREIMAGES>>  FINDINGS:  A sessile polyp was found in the ascending colon. It was 5 mm in size. Polyp was snared without cautery. Retrieval was successful (see image4). snare polyp  Two polyps were found in the distal transverse colon (see image6). 2 flat 3mm polyps - removed with cold polypectomy snare  Moderate diverticulosis was found in the sigmoid colon (see image7).  This was otherwise a normal examination of the colon (see image1). Questionable short or narrowed rectal vault   Retroflexed views in the rectum revealed Unable to retroflex.  secondary to small rectal vault  The time to cecum =  1) 3.0  minutes. The scope was then withdrawn in  1) 8.50 minutes from the cecum and the procedure completed. COMPLICATIONS:  None ENDOSCOPIC IMPRESSION: 1) 5 mm sessile polyp in the ascending colon 2) Two polyps in the distal transverse colon 3) Moderate diverticulosis in the sigmoid colon 4) Otherwise normal  examination RECOMMENDATIONS: 1) High fiber diet 2) Call office for follow-up appointment in 4 weeks REPEAT EXAM:  No  ______________________________ Barbette Hair. Arlyce Dice, MD  CC:  n. eSIGNED:   Barbette Hair. Kain Milosevic at 09/12/2011 03:32 PM  Danie Binder, [id number]

## 2011-09-12 NOTE — Progress Notes (Signed)
Patient did not have preoperative order for IV antibiotic SSI prophylaxis. (G8918)  Patient did not experience any of the following events: a burn prior to discharge; a fall within the facility; wrong site/side/patient/procedure/implant event; or a hospital transfer or hospital admission upon discharge from the facility. (G8907)  

## 2011-09-13 ENCOUNTER — Telehealth: Payer: Self-pay | Admitting: *Deleted

## 2011-09-13 NOTE — Telephone Encounter (Signed)

## 2011-09-17 ENCOUNTER — Encounter: Payer: Self-pay | Admitting: Gastroenterology

## 2011-09-17 ENCOUNTER — Encounter: Payer: Self-pay | Admitting: *Deleted

## 2011-09-26 ENCOUNTER — Telehealth: Payer: Self-pay | Admitting: Gastroenterology

## 2011-09-26 NOTE — Telephone Encounter (Signed)
Spoke with pt and she is aware.

## 2011-09-26 NOTE — Telephone Encounter (Signed)
Pt had a colonoscopy done 09/12/11, she stopped her Plavix for the procedure. Pt wants to know when she should resume her Plavix? She has not started taking it again. Please advise.

## 2011-09-26 NOTE — Telephone Encounter (Signed)
She can resume it today

## 2011-10-10 ENCOUNTER — Ambulatory Visit (INDEPENDENT_AMBULATORY_CARE_PROVIDER_SITE_OTHER): Payer: Medicare Other | Admitting: Gastroenterology

## 2011-10-10 ENCOUNTER — Encounter: Payer: Self-pay | Admitting: Gastroenterology

## 2011-10-10 DIAGNOSIS — R159 Full incontinence of feces: Secondary | ICD-10-CM

## 2011-10-10 DIAGNOSIS — D126 Benign neoplasm of colon, unspecified: Secondary | ICD-10-CM | POA: Insufficient documentation

## 2011-10-10 NOTE — Progress Notes (Signed)
History of Present Illness:  [name] has returned following colonoscopy. Small adenomatous polyps were removed. She's been taking fiber it regularly. She reports improvement in her incontinence although it continues.    Review of Systems: Pertinent positive and negative review of systems were noted in the above HPI section. All other review of systems were otherwise negative.    Current Medications, Allergies, Past Medical History, Past Surgical History, Family History and Social History were reviewed in [employer] electronic medical record  Vital signs were reviewed in today's medical record. Physical Exam: General: Well developed , well nourished, no acute distress

## 2011-10-10 NOTE — Patient Instructions (Signed)
Follow up in 2 months

## 2011-10-10 NOTE — Progress Notes (Signed)
Patient ID: [name], female   DOB: 21-Apr-1924, 78 y.o.   MRN: [id number] I reviewed the patient's pathology from colonoscopy. Adenomatous polyps were removed. A letter was sent out recommending five-year followup. Upon review, see that the patient is 78 years old,  I have amended that recommendation to repeat colonoscopy as needed.

## 2011-10-10 NOTE — Assessment & Plan Note (Addendum)
Symptoms are improved, somewhat, with the addition of fiber. Unfortunately, she's not taking this regularly.  Recommendations #1 I instructed [name] to take fiber daily. I will reevaluate her in several weeks to determine whether this alone is sufficient

## 2011-10-10 NOTE — Assessment & Plan Note (Signed)
In view the patient's age routine colonoscopy will be deferred.

## 2011-10-11 ENCOUNTER — Encounter: Payer: Self-pay | Admitting: *Deleted

## 2011-11-13 HISTORY — PX: OTHER SURGICAL HISTORY: SHX169

## 2012-01-28 ENCOUNTER — Encounter: Payer: Self-pay | Admitting: Neurosurgery

## 2012-01-29 ENCOUNTER — Other Ambulatory Visit: Payer: Medicare Other

## 2012-01-29 ENCOUNTER — Ambulatory Visit: Payer: Medicare Other | Admitting: Neurosurgery

## 2012-01-31 ENCOUNTER — Encounter: Payer: Self-pay | Admitting: Family Medicine

## 2012-01-31 ENCOUNTER — Ambulatory Visit (INDEPENDENT_AMBULATORY_CARE_PROVIDER_SITE_OTHER): Payer: Medicare Other | Admitting: Family Medicine

## 2012-01-31 VITALS — BP 160/100 | HR 80 | Wt 151.0 lb

## 2012-01-31 DIAGNOSIS — M79609 Pain in unspecified limb: Secondary | ICD-10-CM

## 2012-01-31 DIAGNOSIS — M79641 Pain in right hand: Secondary | ICD-10-CM

## 2012-01-31 NOTE — Progress Notes (Signed)
  Subjective:    Patient ID: [name], female    DOB: [birth year], 78 y.o.   MRN: [id number]  HPI Is here for evaluation of a several week history of pain and tingling sensation in the hand that is very vague. She cannot point to any one particular area. She then began talking about her shoulder causing difficulty when she had the pain. No history of injury. She is wearing a splint but she says is not really helpful.   Review of Systems     Objective:   Physical Exam Alert and in no distress. Exam of the right wrist shows no swelling or tenderness with normal motion. Tinel's test negative.      Assessment & Plan:   1. Hand pain, right    Recommend conservative care with continued use of the wrist splint and pain med regularly for the next week or 2. She will call if continued difficulty

## 2012-01-31 NOTE — Patient Instructions (Signed)
Go ahead and take the 800 mg of Advil 3 times a day for the next week,then call me.

## 2012-02-03 ENCOUNTER — Encounter: Payer: Self-pay | Admitting: Neurosurgery

## 2012-02-04 ENCOUNTER — Encounter: Payer: Self-pay | Admitting: Neurosurgery

## 2012-02-04 ENCOUNTER — Ambulatory Visit (INDEPENDENT_AMBULATORY_CARE_PROVIDER_SITE_OTHER): Payer: Medicare Other | Admitting: Neurosurgery

## 2012-02-04 ENCOUNTER — Ambulatory Visit (INDEPENDENT_AMBULATORY_CARE_PROVIDER_SITE_OTHER): Payer: Medicare Other | Admitting: Vascular Surgery

## 2012-02-04 VITALS — BP 203/95 | HR 63 | Resp 16 | Ht 64.0 in | Wt 154.2 lb

## 2012-02-04 DIAGNOSIS — I6529 Occlusion and stenosis of unspecified carotid artery: Secondary | ICD-10-CM

## 2012-02-04 DIAGNOSIS — I6522 Occlusion and stenosis of left carotid artery: Secondary | ICD-10-CM | POA: Insufficient documentation

## 2012-02-04 NOTE — Progress Notes (Signed)
[facility] HISTORY AND PHYSICAL   CC: Annual carotid duplex for known stenosis Referring Physician: Hart Rochester  History of Present Illness: 78 year old female patient of Dr. Hart Rochester seen for known carotid stenosis right greater than left. The patient is asymptomatic and denies any signs or symptoms of CVA, TIA, amaurosis fugax or any neural deficit including expressive or receptive aphasia. The patient denies any new medical diagnoses or recent surgeries, however she does state she has a burning sensation in her hands bilaterally that may be carpal tunnel.  Past Medical History  Diagnosis Date  . Peripheral vascular disease   . Coronary artery disease   . Heart murmur   . Chronic kidney disease 1964    renal artery left kidney stent  . Hypertension   . Stroke   . Myocardial infarction   . DVT (deep venous thrombosis)   . Aneurysm   . Anemia     ROS: [x]  Positive   [ ]  Denies    General: [ ]  Weight loss, [ ]  Fever, [ ]  chills Neurologic: [ ]  Dizziness, [ ]  Blackouts, [ ]  Seizure [ ]  Stroke, [ ]  "Mini stroke", [ ]  Slurred speech, [ ]  Temporary blindness; [ ]  weakness in arms or legs, [ ]  Hoarseness Cardiac: [ ]  Chest pain/pressure, [ ]  Shortness of breath at rest [ ]  Shortness of breath with exertion, [ ]  Atrial fibrillation or irregular heartbeat Vascular: [ ]  Pain in legs with walking, [ ]  Pain in legs at rest, [ ]  Pain in legs at night,  [ ]  Non-healing ulcer, [ ]  Blood clot in vein/DVT,   Pulmonary: [ ]  Home oxygen, [ ]  Productive cough, [ ]  Coughing up blood, [ ]  Asthma,  [ ]  Wheezing Musculoskeletal:  [ ]  Arthritis, [ ]  Low back pain, [ ]  Joint pain Hematologic: [ ]  Easy Bruising, [ ]  Anemia; [ ]  Hepatitis Gastrointestinal: [ ]  Blood in stool, [ ]  Gastroesophageal Reflux/heartburn, [ ]  Trouble swallowing Urinary: [ ]  chronic Kidney disease, [ ]  on HD - [ ]  MWF or [ ]  TTHS, [ ]  Burning with urination, [ ]  Difficulty urinating Skin: [ ]  Rashes, [ ]   Wounds Psychological: [ ]  Anxiety, [ ]  Depression   Social History History  Substance Use Topics  . Smoking status: Never Smoker   . Smokeless tobacco: Never Used  . Alcohol Use: No     social drinker  years ago-no longer    Family History Family History  Problem Relation Age of Onset  . Irritable bowel syndrome Daughter   . Colon cancer Neg Hx     Allergies  Allergen Reactions  . Codeine Rash    Current Outpatient Prescriptions  Medication Sig Dispense Refill  . carvedilol (COREG) 25 MG tablet       . cloNIDine (CATAPRES) 0.1 MG tablet Take 0.1 mg by mouth daily.       . clopidogrel (PLAVIX) 75 MG tablet Take 75 mg by mouth daily.        Marland Kitchen diltiazem (CARDIZEM) 60 MG tablet Take 60 mg by mouth daily.       . fluorometholone (FML) 0.1 % ophthalmic suspension Place 1 drop into both eyes 2 (two) times daily.        Marland Kitchen ibuprofen (ADVIL,MOTRIN) 800 MG tablet Take 800 mg by mouth as needed. For pain.      Marland Kitchen lisinopril (PRINIVIL,ZESTRIL) 20 MG tablet Take 20 mg by mouth daily.       . Omega-3  Fatty Acids (FISH OIL) 300 MG CAPS Take 2 capsules by mouth every 30 (thirty) days.       . simvastatin (ZOCOR) 40 MG tablet Take 40 mg by mouth daily.          Physical Examination  Filed Vitals:   02/04/12 1614  BP: 203/95  Pulse: 63  Resp:     Body mass index is 26.47 kg/(m^2).  General:  WDWN in NAD Gait: Normal HEENT: WNL Eyes: Pupils equal Pulmonary: normal non-labored breathing , without Rales, rhonchi,  wheezing Cardiac: RRR, without  Murmurs, rubs or gallops; Abdomen: soft, NT, no masses Skin: no rashes, ulcers noted  Vascular Exam Pulses: 2+ radial pulses bilaterally Carotid bruits are heard bilaterally right greater than left Extremities without ischemic changes, no Gangrene , no cellulitis; no open wounds;  Musculoskeletal: no muscle wasting or atrophy   Neurologic: A&O X 3; Appropriate Affect ; SENSATION: normal; MOTOR FUNCTION:  moving all extremities  equally. Speech is fluent/normal  Non-Invasive Vascular Imaging CAROTID DUPLEX 02/04/2012  Right ICA 60 - 79 % stenosis Left ICA 20 - 39 % stenosis   ASSESSMENT/PLAN: Asymptomatic patient with an increase in her carotid stenosis on the right. The patient will return in 6 months for repeat carotid duplex and be seen in my clinic. Her questions were encouraged and answered.  Lauree Chandler ANP   Clinic MD: Early

## 2012-02-05 NOTE — Addendum Note (Signed)
Addended by: Sharee Pimple on: 02/05/2012 09:50 AM   Modules accepted: Orders

## 2012-02-07 ENCOUNTER — Telehealth: Payer: Self-pay | Admitting: Family Medicine

## 2012-02-07 NOTE — Telephone Encounter (Signed)
Schedule her to come back in for reevaluation next week

## 2012-02-07 NOTE — Telephone Encounter (Signed)
Pt has appt monday

## 2012-02-10 ENCOUNTER — Encounter: Payer: Self-pay | Admitting: Family Medicine

## 2012-02-10 ENCOUNTER — Ambulatory Visit (INDEPENDENT_AMBULATORY_CARE_PROVIDER_SITE_OTHER): Payer: Medicare Other | Admitting: Family Medicine

## 2012-02-10 VITALS — BP 120/70 | HR 84 | Wt 152.0 lb

## 2012-02-10 DIAGNOSIS — R208 Other disturbances of skin sensation: Secondary | ICD-10-CM

## 2012-02-10 DIAGNOSIS — R209 Unspecified disturbances of skin sensation: Secondary | ICD-10-CM

## 2012-02-10 NOTE — Procedures (Unsigned)
CAROTID DUPLEX EXAM  INDICATION:  Carotid stenosis.  HISTORY: Diabetes:  No Cardiac:  MI Hypertension:  Yes Smoking:  No Previous Surgery:  No carotid intervention CV History:  TIA 17 years ago Amaurosis Fugax No, Paresthesias Yes, Hemiparesis No                                      RIGHT             LEFT Brachial systolic pressure:         182               190 Brachial Doppler waveforms:         WNL               WNL Vertebral direction of flow:        Antegrade         Antegrade DUPLEX VELOCITIES (cm/sec) CCA peak systolic                   66                116 ECA peak systolic                   36                31 ICA peak systolic                   214               93 ICA end diastolic                   67                27 PLAQUE MORPHOLOGY:                  Calcified         Heterogeneous PLAQUE AMOUNT:                      Moderate to severe                  Mild PLAQUE LOCATION:                    CCA/ICA           CCA/ICA  IMPRESSION: 1. Bilateral common carotid artery tortuosity present. 2. Right internal carotid artery stenosis present in the 60% to 79%     range, dense calcific plaque present with vessel tortuosity making     Doppler interrogation difficult. 3. Bilateral external carotid arteries are patent. 4. Left internal carotid artery stenosis present in the 1% to 39%     range. 5. Bilateral vertebral arteries are patent and antegrade. 6. Increase in the disease process on the right and stable on the left     since study on 01/28/2011, however, consistent with study done on     01/23/2010.  ___________________________________________ Quita Skye. Hart Rochester, M.D.  SH/MEDQ  D:  02/04/2012  T:  02/04/2012  Job:  [id number]

## 2012-02-10 NOTE — Progress Notes (Signed)
  Subjective:    Patient ID: [name], female    DOB: 04-05-1924, 78 y.o.   MRN: [id number]  HPI She has a one-month history of bilateral arm burning and tingling. She does occasionally note difficulty with fine motor movements stating she has occasionally dropped things especially with her right arm. He has had no neck pain.  Review of Systems     Objective:   Physical Exam Alert  and in no distress. Normal motor, sensory and DTRs with good pulses of her upper extremities      Assessment & Plan:  Dysesthesia etiology unclear. I will refer to neurology.

## 2012-06-19 ENCOUNTER — Other Ambulatory Visit: Payer: Self-pay | Admitting: Family Medicine

## 2012-06-19 NOTE — Telephone Encounter (Signed)
Is this okay to refill? 

## 2012-06-20 NOTE — Telephone Encounter (Signed)
Needs an appt

## 2012-07-20 ENCOUNTER — Telehealth: Payer: Self-pay | Admitting: Internal Medicine

## 2012-07-20 MED ORDER — IBUPROFEN 800 MG PO TABS: 800.0000 mg | ORAL_TABLET | ORAL | Status: DC | PRN

## 2012-07-20 NOTE — Telephone Encounter (Signed)
Ibuprofen renewed

## 2012-07-22 ENCOUNTER — Other Ambulatory Visit: Payer: Self-pay | Admitting: Family Medicine

## 2012-08-04 ENCOUNTER — Encounter: Payer: Self-pay | Admitting: Neurosurgery

## 2012-08-05 ENCOUNTER — Ambulatory Visit: Payer: Medicare Other | Admitting: Neurosurgery

## 2012-08-05 ENCOUNTER — Other Ambulatory Visit: Payer: Medicare Other

## 2012-08-10 ENCOUNTER — Other Ambulatory Visit: Payer: Self-pay

## 2012-08-10 ENCOUNTER — Other Ambulatory Visit: Payer: Self-pay | Admitting: Family Medicine

## 2012-08-10 NOTE — Telephone Encounter (Signed)
Have her schedule a follow-up appointment 

## 2012-08-10 NOTE — Telephone Encounter (Signed)
Is this ok?

## 2012-08-31 ENCOUNTER — Ambulatory Visit (INDEPENDENT_AMBULATORY_CARE_PROVIDER_SITE_OTHER): Payer: Medicare Other | Admitting: Family Medicine

## 2012-08-31 ENCOUNTER — Encounter: Payer: Self-pay | Admitting: Family Medicine

## 2012-08-31 VITALS — BP 130/80 | HR 66 | Temp 98.2°F | Wt 151.0 lb

## 2012-08-31 DIAGNOSIS — I739 Peripheral vascular disease, unspecified: Secondary | ICD-10-CM

## 2012-08-31 DIAGNOSIS — J209 Acute bronchitis, unspecified: Secondary | ICD-10-CM

## 2012-08-31 MED ORDER — AMOXICILLIN 875 MG PO TABS: 875.0000 mg | ORAL_TABLET | Freq: Two times a day (BID) | ORAL | Status: DC

## 2012-08-31 MED ORDER — IBUPROFEN 800 MG PO TABS: 800.0000 mg | ORAL_TABLET | ORAL | Status: DC | PRN

## 2012-08-31 NOTE — Progress Notes (Signed)
  Subjective:    Patient ID: [name], female    DOB: 1924-07-23, 79 y.o.   MRN: [id number]  HPI She has a three-week history of intermittent productive cough with chills but no difficulty with sore throat, earache, nasal congestion, PND. She also takes Advil for refill of leg pain secondary to claudication and subsequent stenting that still occasionally gives her difficulty.   Review of Systems     Objective:   Physical Exam alert and in no distress. Tympanic membranes and canals are normal. Throat is clear. Tonsils are normal. Neck is supple without adenopathy or thyromegaly. Cardiac exam shows a regular sinus rhythm without murmurs or gallops. Lungs are clear to auscultation.        Assessment & Plan:   1. Acute bronchitis  amoxicillin (AMOXIL) 875 MG tablet  2. PAD (peripheral artery disease)  ibuprofen (ADVIL,MOTRIN) 800 MG tablet  3. Claudication  ibuprofen (ADVIL,MOTRIN) 800 MG tablet   she will call if continued difficulty from her cough and congestion.

## 2012-08-31 NOTE — Patient Instructions (Signed)
Take all the antibiotic and if not totally back to normal at the end of it, give me a call

## 2012-09-23 ENCOUNTER — Encounter (HOSPITAL_COMMUNITY): Payer: Self-pay | Admitting: General Practice

## 2012-09-23 ENCOUNTER — Emergency Department (HOSPITAL_COMMUNITY): Payer: Medicare Other

## 2012-09-23 ENCOUNTER — Inpatient Hospital Stay (HOSPITAL_COMMUNITY)
Admission: EM | Admit: 2012-09-23 | Discharge: 2012-10-02 | DRG: 690 | Disposition: A | Discharge status: Home Health | Payer: Medicare Other | Attending: Internal Medicine | Admitting: Internal Medicine

## 2012-09-23 ENCOUNTER — Inpatient Hospital Stay (HOSPITAL_COMMUNITY): Payer: Medicare Other

## 2012-09-23 DIAGNOSIS — Z86718 Personal history of other venous thrombosis and embolism: Secondary | ICD-10-CM

## 2012-09-23 DIAGNOSIS — I739 Peripheral vascular disease, unspecified: Secondary | ICD-10-CM

## 2012-09-23 DIAGNOSIS — Z79899 Other long term (current) drug therapy: Secondary | ICD-10-CM

## 2012-09-23 DIAGNOSIS — E876 Hypokalemia: Secondary | ICD-10-CM | POA: Diagnosis not present

## 2012-09-23 DIAGNOSIS — Z23 Encounter for immunization: Secondary | ICD-10-CM

## 2012-09-23 DIAGNOSIS — T46905A Adverse effect of unspecified agents primarily affecting the cardiovascular system, initial encounter: Secondary | ICD-10-CM | POA: Diagnosis present

## 2012-09-23 DIAGNOSIS — I252 Old myocardial infarction: Secondary | ICD-10-CM

## 2012-09-23 DIAGNOSIS — Z8543 Personal history of malignant neoplasm of ovary: Secondary | ICD-10-CM

## 2012-09-23 DIAGNOSIS — N179 Acute kidney failure, unspecified: Secondary | ICD-10-CM

## 2012-09-23 DIAGNOSIS — Z9861 Coronary angioplasty status: Secondary | ICD-10-CM

## 2012-09-23 DIAGNOSIS — N12 Tubulo-interstitial nephritis, not specified as acute or chronic: Principal | ICD-10-CM

## 2012-09-23 DIAGNOSIS — R159 Full incontinence of feces: Secondary | ICD-10-CM

## 2012-09-23 DIAGNOSIS — A498 Other bacterial infections of unspecified site: Secondary | ICD-10-CM | POA: Diagnosis present

## 2012-09-23 DIAGNOSIS — N184 Chronic kidney disease, stage 4 (severe): Secondary | ICD-10-CM

## 2012-09-23 DIAGNOSIS — I10 Essential (primary) hypertension: Secondary | ICD-10-CM

## 2012-09-23 DIAGNOSIS — I6529 Occlusion and stenosis of unspecified carotid artery: Secondary | ICD-10-CM

## 2012-09-23 DIAGNOSIS — I129 Hypertensive chronic kidney disease with stage 1 through stage 4 chronic kidney disease, or unspecified chronic kidney disease: Secondary | ICD-10-CM | POA: Diagnosis present

## 2012-09-23 DIAGNOSIS — I2510 Atherosclerotic heart disease of native coronary artery without angina pectoris: Secondary | ICD-10-CM

## 2012-09-23 DIAGNOSIS — N183 Chronic kidney disease, stage 3 unspecified: Secondary | ICD-10-CM

## 2012-09-23 DIAGNOSIS — Z8673 Personal history of transient ischemic attack (TIA), and cerebral infarction without residual deficits: Secondary | ICD-10-CM

## 2012-09-23 DIAGNOSIS — B9620 Unspecified Escherichia coli [E. coli] as the cause of diseases classified elsewhere: Secondary | ICD-10-CM

## 2012-09-23 DIAGNOSIS — R3129 Other microscopic hematuria: Secondary | ICD-10-CM | POA: Diagnosis present

## 2012-09-23 DIAGNOSIS — Z7902 Long term (current) use of antithrombotics/antiplatelets: Secondary | ICD-10-CM

## 2012-09-23 DIAGNOSIS — D126 Benign neoplasm of colon, unspecified: Secondary | ICD-10-CM

## 2012-09-23 DIAGNOSIS — M129 Arthropathy, unspecified: Secondary | ICD-10-CM | POA: Diagnosis present

## 2012-09-23 DIAGNOSIS — E860 Dehydration: Secondary | ICD-10-CM | POA: Diagnosis present

## 2012-09-23 DIAGNOSIS — D631 Anemia in chronic kidney disease: Secondary | ICD-10-CM | POA: Diagnosis present

## 2012-09-23 DIAGNOSIS — N1330 Unspecified hydronephrosis: Secondary | ICD-10-CM

## 2012-09-23 DIAGNOSIS — R319 Hematuria, unspecified: Secondary | ICD-10-CM

## 2012-09-23 HISTORY — DX: Unspecified osteoarthritis, unspecified site: M19.90

## 2012-09-23 LAB — URINE MICROSCOPIC-ADD ON

## 2012-09-23 LAB — COMPREHENSIVE METABOLIC PANEL
ALT: 15 U/L (ref 0–35)
AST: 23 U/L (ref 0–37)
Albumin: 3.1 g/dL — ABNORMAL LOW (ref 3.5–5.2)
Alkaline Phosphatase: 96 U/L (ref 39–117)
BUN: 34 mg/dL — ABNORMAL HIGH (ref 6–23)
CO2: 26 mEq/L (ref 19–32)
Calcium: 10.3 mg/dL (ref 8.4–10.5)
Chloride: 102 mEq/L (ref 96–112)
Creatinine, Ser: 2.78 mg/dL — ABNORMAL HIGH (ref 0.50–1.10)
GFR calc Af Amer: 17 mL/min — ABNORMAL LOW (ref >90)
GFR calc non Af Amer: 14 mL/min — ABNORMAL LOW (ref >90)
Glucose, Bld: 176 mg/dL — ABNORMAL HIGH (ref 70–99)
Potassium: 3.6 mEq/L (ref 3.5–5.1)
Sodium: 140 mEq/L (ref 135–145)
Total Bilirubin: 0.7 mg/dL (ref 0.3–1.2)
Total Protein: 6.8 g/dL (ref 6.0–8.3)

## 2012-09-23 LAB — CBC WITH DIFFERENTIAL/PLATELET
Eosinophils Absolute: 0 10*3/uL (ref 0.0–0.7)
Hemoglobin: 10.8 g/dL — ABNORMAL LOW (ref 12.0–15.0)
Lymphocytes Relative: 5 % — ABNORMAL LOW (ref 12–46)
Lymphs Abs: 0.7 10*3/uL (ref 0.7–4.0)
MCH: 32.2 pg (ref 26.0–34.0)
Monocytes Relative: 11 % (ref 3–12)
Neutrophils Relative %: 83 % — ABNORMAL HIGH (ref 43–77)
Platelets: 102 10*3/uL — ABNORMAL LOW (ref 150–400)
RBC: 3.35 MIL/uL — ABNORMAL LOW (ref 3.87–5.11)
WBC: 12.8 10*3/uL — ABNORMAL HIGH (ref 4.0–10.5)

## 2012-09-23 LAB — LIPASE, BLOOD: Lipase: 18 U/L (ref 11–59)

## 2012-09-23 LAB — URINALYSIS, ROUTINE W REFLEX MICROSCOPIC
Bilirubin Urine: NEGATIVE
Ketones, ur: NEGATIVE mg/dL
Nitrite: NEGATIVE
Protein, ur: 100 mg/dL — AB
Specific Gravity, Urine: 1.015 (ref 1.005–1.030)
Urobilinogen, UA: 1 mg/dL (ref 0.0–1.0)

## 2012-09-23 LAB — OCCULT BLOOD, POC DEVICE: Fecal Occult Bld: NEGATIVE

## 2012-09-23 MED ORDER — OXYCODONE HCL 5 MG PO TABS
5.0000 mg | ORAL_TABLET | ORAL | Status: DC | PRN
  Administered 2012-09-25 – 2012-09-28 (×4): 5 mg via ORAL
  Filled 2012-09-23 (×5): qty 1

## 2012-09-23 MED ORDER — ATORVASTATIN CALCIUM 20 MG PO TABS
20.0000 mg | ORAL_TABLET | Freq: Every day | ORAL | Status: DC
  Administered 2012-09-24 – 2012-10-01 (×8): 20 mg via ORAL
  Filled 2012-09-23 (×9): qty 1

## 2012-09-23 MED ORDER — SODIUM CHLORIDE 0.9 % IV SOLN
INTRAVENOUS | Status: DC
  Administered 2012-09-23 – 2012-09-25 (×6): via INTRAVENOUS

## 2012-09-23 MED ORDER — PNEUMOCOCCAL VAC POLYVALENT 25 MCG/0.5ML IJ INJ
0.5000 mL | INJECTION | INTRAMUSCULAR | Status: AC
  Administered 2012-09-24: 0.5 mL via INTRAMUSCULAR
  Filled 2012-09-23: qty 0.5

## 2012-09-23 MED ORDER — CARVEDILOL 25 MG PO TABS
25.0000 mg | ORAL_TABLET | Freq: Two times a day (BID) | ORAL | Status: DC
  Filled 2012-09-23 (×3): qty 1

## 2012-09-23 MED ORDER — INFLUENZA VIRUS VACC SPLIT PF IM SUSP
0.5000 mL | INTRAMUSCULAR | Status: AC
  Administered 2012-09-24: 0.5 mL via INTRAMUSCULAR
  Filled 2012-09-23: qty 0.5

## 2012-09-23 MED ORDER — DEXTROSE 5 % IV SOLN
1.0000 g | Freq: Once | INTRAVENOUS | Status: AC
  Administered 2012-09-23: 1 g via INTRAVENOUS
  Filled 2012-09-23: qty 10

## 2012-09-23 MED ORDER — CLOPIDOGREL BISULFATE 75 MG PO TABS
75.0000 mg | ORAL_TABLET | Freq: Every day | ORAL | Status: DC
  Administered 2012-09-23 – 2012-10-02 (×9): 75 mg via ORAL
  Filled 2012-09-23 (×10): qty 1

## 2012-09-23 MED ORDER — IOHEXOL 300 MG/ML  SOLN
50.0000 mL | INTRAMUSCULAR | Status: DC
  Administered 2012-09-23: 50 mL via ORAL

## 2012-09-23 MED ORDER — CLONIDINE HCL 0.1 MG PO TABS
0.1000 mg | ORAL_TABLET | Freq: Every day | ORAL | Status: DC
  Filled 2012-09-23 (×2): qty 1

## 2012-09-23 MED ORDER — SIMVASTATIN 40 MG PO TABS
40.0000 mg | ORAL_TABLET | Freq: Every day | ORAL | Status: DC
  Administered 2012-09-23: 40 mg via ORAL
  Filled 2012-09-23: qty 1

## 2012-09-23 MED ORDER — ONDANSETRON HCL 4 MG/2ML IJ SOLN
4.0000 mg | Freq: Once | INTRAMUSCULAR | Status: AC
  Administered 2012-09-23: 4 mg via INTRAVENOUS
  Filled 2012-09-23: qty 2

## 2012-09-23 MED ORDER — ONDANSETRON HCL 4 MG/2ML IJ SOLN
4.0000 mg | Freq: Four times a day (QID) | INTRAMUSCULAR | Status: DC | PRN
  Administered 2012-09-26: 4 mg via INTRAVENOUS
  Filled 2012-09-23: qty 2

## 2012-09-23 MED ORDER — ACETAMINOPHEN 325 MG PO TABS
650.0000 mg | ORAL_TABLET | Freq: Four times a day (QID) | ORAL | Status: DC | PRN
  Administered 2012-09-23 – 2012-09-26 (×3): 650 mg via ORAL
  Filled 2012-09-23 (×3): qty 2

## 2012-09-23 MED ORDER — DILTIAZEM HCL ER 60 MG PO CP12
60.0000 mg | ORAL_CAPSULE | Freq: Every day | ORAL | Status: DC
  Filled 2012-09-23: qty 1

## 2012-09-23 MED ORDER — SODIUM CHLORIDE 0.9 % IJ SOLN
3.0000 mL | Freq: Two times a day (BID) | INTRAMUSCULAR | Status: DC
  Administered 2012-09-24 – 2012-10-02 (×8): 3 mL via INTRAVENOUS

## 2012-09-23 MED ORDER — MORPHINE SULFATE 4 MG/ML IJ SOLN
4.0000 mg | Freq: Once | INTRAMUSCULAR | Status: AC
  Administered 2012-09-23: 4 mg via INTRAVENOUS
  Filled 2012-09-23: qty 1

## 2012-09-23 MED ORDER — ACETAMINOPHEN 650 MG RE SUPP: 650.0000 mg | Freq: Four times a day (QID) | RECTAL | Status: DC | PRN

## 2012-09-23 MED ORDER — DEXTROSE 5 % IV SOLN
1.0000 g | INTRAVENOUS | Status: DC
  Administered 2012-09-24 – 2012-09-30 (×7): 1 g via INTRAVENOUS
  Filled 2012-09-23 (×7): qty 10

## 2012-09-23 MED ORDER — SENNOSIDES-DOCUSATE SODIUM 8.6-50 MG PO TABS: 1.0000 | ORAL_TABLET | Freq: Every evening | ORAL | Status: DC | PRN

## 2012-09-23 MED ORDER — ONDANSETRON HCL 4 MG PO TABS
4.0000 mg | ORAL_TABLET | Freq: Four times a day (QID) | ORAL | Status: DC | PRN
  Administered 2012-09-25: 4 mg via ORAL
  Filled 2012-09-23: qty 1

## 2012-09-23 MED ORDER — SODIUM CHLORIDE 0.9 % IV BOLUS (SEPSIS)
500.0000 mL | Freq: Once | INTRAVENOUS | Status: AC
  Administered 2012-09-23: 500 mL via INTRAVENOUS

## 2012-09-23 NOTE — ED Notes (Signed)
Per report from [facility] pt began to have back pain, foot pain, and abd pain 4 days ago.  She has now developed N/V and hematuria.  No distress.  Zofran administered PTA with success.  Reports recent stent placements in her R foot.  Pt is A/O x3.

## 2012-09-23 NOTE — ED Provider Notes (Signed)
History     CSN: [id number]  Arrival date & time 09/23/12  0808   First MD Initiated Contact with Patient 09/23/12 0809      Chief Complaint  Patient presents with  . Emesis    (Consider location/radiation/quality/duration/timing/severity/associated sxs/prior treatment) HPI  [name] is a 79 y.o. female past medical history significant for CKD, MI, DVT complaining of diffuse severe intermittent pain mainly in the abdomen and low back started 4 days ago. Patient states that he thinks he passed a kidney stone based on the pain in the mature he. She denies fever but endorses nausea and states she forced herself to throw up thinking it would make her feel better. She also states that she is urinating frequently and has put a bucket by her bedside because she has to go so often. She denies chest pain, shortness of breath outside of her norm.  No solids x4 days.   Past Medical History  Diagnosis Date  . Peripheral vascular disease   . Coronary artery disease   . Heart murmur   . Chronic kidney disease 1964    renal artery left kidney stent  . Hypertension   . Stroke   . Myocardial infarction   . DVT (deep venous thrombosis)   . Aneurysm   . Anemia     Past Surgical History  Procedure Date  . Abdominal hysterectomy   . Bladder surgery     bladder tack  . Kidney surgery     right renal artery stent  . Coronary angioplasty with stent placement   . Leg stents   . Breast lumpectomy     left  . Appendectomy     Family History  Problem Relation Age of Onset  . Irritable bowel syndrome Daughter   . Colon cancer Neg Hx     History  Substance Use Topics  . Smoking status: Never Smoker   . Smokeless tobacco: Never Used  . Alcohol Use: No     Comment: social drinker  years ago-no longer    OB History    Grav Para Term Preterm Abortions TAB SAB Ect Mult Living                  Review of Systems  Constitutional: Negative for fever.  Respiratory: Negative for  shortness of breath.   Cardiovascular: Negative for chest pain.  Gastrointestinal: Positive for nausea and abdominal pain. Negative for vomiting and diarrhea.  Genitourinary: Positive for hematuria.  All other systems reviewed and are negative.    Allergies  Codeine  Home Medications   Current Outpatient Rx  Name  Route  Sig  Dispense  Refill  . AMOXICILLIN 875 MG PO TABS   Oral   Take 1 tablet (875 mg total) by mouth 2 (two) times daily.   20 tablet   0   . CARVEDILOL 25 MG PO TABS               . CLONIDINE HCL 0.1 MG PO TABS   Oral   Take 0.1 mg by mouth daily.          Marland Kitchen CLOPIDOGREL BISULFATE 75 MG PO TABS   Oral   Take 75 mg by mouth daily.           Marland Kitchen DILTIAZEM HCL 60 MG PO TABS   Oral   Take 60 mg by mouth daily.          Marland Kitchen FLUOROMETHOLONE 0.1 % OP SUSP  Both Eyes   Place 1 drop into both eyes 2 (two) times daily.           . IBUPROFEN 800 MG PO TABS   Oral   Take 1 tablet (800 mg total) by mouth as needed. For pain.   30 tablet   5   . LISINOPRIL 20 MG PO TABS   Oral   Take 20 mg by mouth daily.          Marland Kitchen FISH OIL 300 MG PO CAPS   Oral   Take 2 capsules by mouth every 30 (thirty) days.          Marland Kitchen SIMVASTATIN 40 MG PO TABS   Oral   Take 40 mg by mouth daily.             There were no vitals taken for this visit.  Physical Exam  Nursing note and vitals reviewed. Constitutional: She is oriented to person, place, and time. She appears well-developed and well-nourished. No distress.  HENT:  Head: Normocephalic.  Mouth/Throat: Oropharynx is clear and moist.  Eyes: Conjunctivae normal and EOM are normal. Pupils are equal, round, and reactive to light.  Cardiovascular: Normal rate and intact distal pulses.   Pulmonary/Chest: Effort normal and breath sounds normal. No stridor. No respiratory distress. She has no wheezes. She has no rales. She exhibits no tenderness.  Abdominal: Soft. Bowel sounds are normal. She exhibits no  distension and no mass. There is tenderness. There is no rebound and no guarding.       Tenderness to palpation of left lower quadrant, no guarding or peritoneal signs  Musculoskeletal: Normal range of motion.  Neurological: She is alert and oriented to person, place, and time.  Psychiatric: She has a normal mood and affect.    ED Course  Procedures (including critical care time)  Labs Reviewed  CBC WITH DIFFERENTIAL - Abnormal; Notable for the following:    WBC 12.8 (*)     RBC 3.35 (*)     Hemoglobin 10.8 (*)     HCT 32.7 (*)     Platelets 102 (*)  PLATELET COUNT CONFIRMED BY SMEAR   Neutrophils Relative 83 (*)     Neutro Abs 10.6 (*)     Lymphocytes Relative 5 (*)     Monocytes Absolute 1.4 (*)     All other components within normal limits  COMPREHENSIVE METABOLIC PANEL - Abnormal; Notable for the following:    Glucose, Bld 176 (*)     BUN 34 (*)     Creatinine, Ser 2.78 (*)     Albumin 3.1 (*)     GFR calc non Af Amer 14 (*)     GFR calc Af Amer 17 (*)     All other components within normal limits  URINALYSIS, ROUTINE W REFLEX MICROSCOPIC - Abnormal; Notable for the following:    APPearance TURBID (*)     Hgb urine dipstick LARGE (*)     Protein, ur 100 (*)     Leukocytes, UA LARGE (*)     All other components within normal limits  URINE MICROSCOPIC-ADD ON - Abnormal; Notable for the following:    Squamous Epithelial / LPF MANY (*)     Bacteria, UA MANY (*)     All other components within normal limits  LIPASE, BLOOD  LACTIC ACID, PLASMA  OCCULT BLOOD, POC DEVICE  URINE CULTURE   Ct Abdomen Pelvis Wo Contrast  09/23/2012  *RADIOLOGY REPORT*  Clinical Data:  Generalized abdominal pain, vomiting, hematuria.  CT ABDOMEN AND PELVIS WITHOUT CONTRAST  Technique:  Multidetector CT imaging of the abdomen and pelvis was performed following the standard protocol without intravenous contrast.  Comparison: 05/27/2006  Findings: Small to moderate hiatal hernia, stable.  Dependent  atelectasis in the lung bases.  No effusions.  Heart is mildly enlarged.  Gallstones fill the gallbladder, stable.  Liver, stomach, spleen, pancreas, adrenals have an unremarkable unenhanced appearance.  Mild left hydronephrosis and perinephric stranding.  The left ureter is decompressed.  No definite visualized ureteral stone. Multiple calcifications in the anatomic pelvis felt represent phleboliths.  Urinary bladder is grossly unremarkable. Areas of scarring in the right kidney without hydronephrosis.  Prior hysterectomy.  Scattered sigmoid diverticula.  No active diverticulitis.  Small bowel is decompressed.  No free fluid, free air or adenopathy.  Aorta and iliac vessels are heavily calcified, non-aneurysmal.  The degenerative changes in the lumbar spine.  No acute bony abnormality.  IMPRESSION: Mild left hydronephrosis and perinephric stranding.  The left ureter is decompressed, and I see no definite ureteral stones. Question recent passage of stone.  Cholelithiasis.  Small to moderate hiatal hernia.  Sigmoid diverticulosis.   Original Report Authenticated By: Charlett Nose, M.D.      1. Pyelonephritis   2. AKI (acute kidney injury)       MDM  [name] is a 79 y.o. female complaining of abdominal pain, nausea intermittently over the last 4 days. Patient's lactic acid is normal at 1.6. She has a mild leukocytosis of 12.8 and low platelets of 102. Patient's creatinine is 2.78 and BUN is 34 this appears to be an acute on chronic insult to her kidney.  CAT scan shows left hydronephrosis with perinephric stranding, no stone is seen. Urinalysis is significant for hematuria with many bacteria and white blood cells and leukocytes. Patient will be given a gram of Rocephin IV.    Filed Vitals:   09/23/12 0845 09/23/12 1000 09/23/12 1100 09/23/12 1130  BP: 134/54 114/66 119/79 128/77  Pulse: 69 66 71 70  Temp:      TempSrc:      Resp:      SpO2: 95% 97% 98% 95%    Pt will be admitted to a  Med surg bed, team [address] Quintella Reichert [place], PA-C 09/23/12 1313

## 2012-09-23 NOTE — ED Provider Notes (Signed)
[name] is a 79 y.o. female who is here for evaluation of anorexia, dysuria, urinary frequency, and blood in urine. Symptoms are persistent for several days. She denies fever, or chills. She has chronic renal insufficiency, and is followed regularly by a nephrologist  Exam- alert, cooperative. Vital signs are normal. Abdomen is soft and nontender. Neurologic is grossly nonfocal. She is alert, oriented x3.  Assessment: Worsening renal insufficiency, with symptoms of UTI, and obstructing ureteral stone. ED evaluation included blood work Urinalysis, and CT abdomen pelvis with oral contrast.  Medical screening examination/treatment/procedure(s) were conducted as a shared visit with non-physician practitioner(s) and myself.  I personally evaluated the patient during the encounter  Flint Melter, MD 09/23/12 1800

## 2012-09-23 NOTE — Progress Notes (Signed)
DIMA MINI [id number]  [name] is a 79 y.o. female patient admitted from ED awake, alert  & orientated  X 3, no distress noted.  VSS - Blood pressure 132/75, pulse 93, temperature 103.1 F (39.5 C), temperature source Oral, resp. rate 20, height 5\' 4"  (1.626 m), weight 67 kg (147 lb 11.3 oz), SpO2 93.00%.  no c/o shortness of breath, no c/o chest pain. Cardiac tele #[id number], in place, cardiac monitor yields:normal sinus rhythm. Normal Saline started. IV in place with a transparent dsg that's clean dry and intact without redness. Patient Stated she lives at home with an 46 yo child and grand daughter.      Will cont to eval and treat per MD orders.  Bing Quarry, RN 09/23/2012 7:40 PM

## 2012-09-23 NOTE — ED Notes (Signed)
Patient transported to CT 

## 2012-09-23 NOTE — Progress Notes (Signed)
Utilization review completed.  P.J. Vonya Ohalloran,RN,BSN Case Manager [phone]  

## 2012-09-23 NOTE — Progress Notes (Signed)
Patient with fever and cough. Placed on Droplet precaution. 650mg  oF tylenol given for fever.

## 2012-09-23 NOTE — H&P (Signed)
[facility]          History and Physical    PCP:   Carollee Herter, MD   Chief Complaint:  Left flank pain, blood in urine  HPI: Rebecca 79 y/o Case with a h/o HTN, who states 6 days ago had severe left plank pain, dysuria and blood in urine. The pain and dysuria improved but the hematuria never resolved. Today she again had pain, this time with n/v. She has not had anything to eat or drink in 5 days. Positive chills, but has not taken her temp. She is found to have pyelo, as well as ARF and dehydration, and we have been asked to admit her for further evaluation and management. Of note the CT scan showed lrft mild hydronephrosis but no stones visualized.  Allergies:   Allergies  Allergen Reactions  . Codeine Rash      Past Medical History  Diagnosis Date  . Peripheral vascular disease   . Coronary artery disease   . Heart murmur   . Chronic kidney disease 1964    renal artery left kidney stent  . Hypertension   . Stroke   . Myocardial infarction   . DVT (deep venous thrombosis)   . Aneurysm   . Anemia     Past Surgical History  Procedure Date  . Abdominal hysterectomy   . Bladder surgery     bladder tack  . Kidney surgery     right renal artery stent  . Coronary angioplasty with stent placement   . Leg stents   . Breast lumpectomy     left  . Appendectomy     Prior to Admission medications   Medication Sig Start Date End Date Taking? Authorizing Provider  carvedilol (COREG) 25 MG tablet Take 25 mg by mouth 2 (two) times daily with a meal.  08/26/11  Yes Ronnald Nian, MD  cloNIDine (CATAPRES) 0.1 MG tablet Take 0.1 mg by mouth at bedtime.    Yes Historical Provider, MD  clopidogrel (PLAVIX) 75 MG tablet Take 75 mg by mouth daily.   Yes Historical Provider, MD  diltiazem (CARDIZEM SR) 60 MG 12 hr capsule Take 60 mg by mouth daily.   Yes Historical Provider, MD  ibuprofen (ADVIL,MOTRIN) 800 MG tablet Take 1 tablet (800 mg total) by  mouth as needed. For pain. 08/31/12  Yes Ronnald Nian, MD  lisinopril (PRINIVIL,ZESTRIL) 40 MG tablet Take 40 mg by mouth daily.   Yes Historical Provider, MD  simvastatin (ZOCOR) 40 MG tablet Take 40 mg by mouth daily.    Yes Historical Provider, MD    Social History:  reports that she has never smoked. She has never used smokeless tobacco. She reports that she does not drink alcohol or use illicit drugs.  Family History  Problem Relation Age of Onset  . Irritable bowel syndrome Daughter   . Colon cancer Neg Hx     Review of Systems:  Constitutional: Positive for fever, chills, appetite change and fatigue.  HEENT: Denies photophobia, eye pain, redness, hearing loss, ear pain, congestion, sore throat, rhinorrhea, sneezing, mouth sores, trouble swallowing, neck pain, neck stiffness and tinnitus.   Respiratory: Denies SOB, DOE, cough, chest tightness,  and wheezing.   Cardiovascular: Denies chest pain, palpitations and leg swelling.  Gastrointestinal: Denies  diarrhea, constipation, blood in stool and abdominal distention.  Genitourinary: Positive for dysuria, urgency, frequency, hematuria, flank pain and difficulty urinating.  Musculoskeletal: Denies myalgias, back pain, joint swelling, arthralgias and gait problem.  Skin: Denies pallor, rash and wound.  Neurological: Denies dizziness, seizures, syncope, weakness, light-headedness, numbness and headaches.  Hematological: Denies adenopathy. Easy bruising, personal or family bleeding history  Psychiatric/Behavioral: Denies suicidal ideation, mood changes, confusion, nervousness, sleep disturbance and agitation   Physical Exam: Blood pressure 126/65, pulse 80, temperature 98 F (36.7 C), temperature source Oral, resp. rate 20, SpO2 93.00%. Gen: AA Ox3, NAD HEENT: [place]/AT/PERRL/EOMI/dry mucous membranes Neck: supple, no JVD, no LAD, no bruits, no goiter. CV: RRR, no M/R/G Lungs: CTA B Abd: soft, TTP left flank and suprapubic area, +BS,  no masses or organomegaly. Ext: no C/C/E, +pedal pulses Neuro: grossly intact, non-focal.  Labs on Admission:  Results for orders placed during the hospital encounter of 09/23/12 (from the past 48 hour(s))  LACTIC ACID, PLASMA     Status: Normal   Collection Time   09/23/12  8:31 AM      Component Value Range Comment   Lactic Acid, Venous 1.6  0.5 - 2.2 mmol/L   CBC WITH DIFFERENTIAL     Status: Abnormal   Collection Time   09/23/12  8:40 AM      Component Value Range Comment   WBC 12.8 (*) 4.0 - 10.5 K/uL    RBC 3.35 (*) 3.87 - 5.11 MIL/uL    Hemoglobin 10.8 (*) 12.0 - 15.0 g/dL    HCT 96.0 (*) 45.4 - 46.0 %    MCV 97.6  78.0 - 100.0 fL    MCH 32.2  26.0 - 34.0 pg    MCHC 33.0  30.0 - 36.0 g/dL    RDW 09.8  11.9 - 14.7 %    Platelets 102 (*) 150 - 400 K/uL PLATELET COUNT CONFIRMED BY SMEAR   Neutrophils Relative 83 (*) 43 - 77 %    Neutro Abs 10.6 (*) 1.7 - 7.7 K/uL    Lymphocytes Relative 5 (*) 12 - 46 %    Lymphs Abs 0.7  0.7 - 4.0 K/uL    Monocytes Relative 11  3 - 12 %    Monocytes Absolute 1.4 (*) 0.1 - 1.0 K/uL    Eosinophils Relative 0  0 - 5 %    Eosinophils Absolute 0.0  0.0 - 0.7 K/uL    Basophils Relative 0  0 - 1 %    Basophils Absolute 0.0  0.0 - 0.1 K/uL   COMPREHENSIVE METABOLIC PANEL     Status: Abnormal   Collection Time   09/23/12  8:40 AM      Component Value Range Comment   Sodium 140  135 - 145 mEq/L    Potassium 3.6  3.5 - 5.1 mEq/L    Chloride 102  96 - 112 mEq/L    CO2 26  19 - 32 mEq/L    Glucose, Bld 176 (*) 70 - 99 mg/dL    BUN 34 (*) 6 - 23 mg/dL    Creatinine, Ser 8.29 (*) 0.50 - 1.10 mg/dL    Calcium 56.2  8.4 - 10.5 mg/dL    Total Protein 6.8  6.0 - 8.3 g/dL    Albumin 3.1 (*) 3.5 - 5.2 g/dL    AST 23  0 - 37 U/L    ALT 15  0 - 35 U/L    Alkaline Phosphatase 96  39 - 117 U/L    Total Bilirubin 0.7  0.3 - 1.2 mg/dL    GFR calc non Af Amer 14 (*) >90 mL/min    GFR calc Af Amer 17 (*) >90  mL/min   LIPASE, BLOOD     Status: Normal    Collection Time   09/23/12  8:40 AM      Component Value Range Comment   Lipase 18  11 - 59 U/L   URINALYSIS, ROUTINE W REFLEX MICROSCOPIC     Status: Abnormal   Collection Time   09/23/12 10:03 AM      Component Value Range Comment   Color, Urine YELLOW  YELLOW    APPearance TURBID (*) CLEAR    Specific Gravity, Urine 1.015  1.005 - 1.030    pH 5.5  5.0 - 8.0    Glucose, UA NEGATIVE  NEGATIVE mg/dL    Hgb urine dipstick LARGE (*) NEGATIVE    Bilirubin Urine NEGATIVE  NEGATIVE    Ketones, ur NEGATIVE  NEGATIVE mg/dL    Protein, ur 960 (*) NEGATIVE mg/dL    Urobilinogen, UA 1.0  0.0 - 1.0 mg/dL    Nitrite NEGATIVE  NEGATIVE    Leukocytes, UA LARGE (*) NEGATIVE   URINE MICROSCOPIC-ADD ON     Status: Abnormal   Collection Time   09/23/12 10:03 AM      Component Value Range Comment   Squamous Epithelial / LPF MANY (*) RARE    WBC, UA TOO NUMEROUS TO COUNT  <3 WBC/hpf    RBC / HPF 7-10  <3 RBC/hpf    Bacteria, UA MANY (*) RARE   OCCULT BLOOD, POC DEVICE     Status: Normal   Collection Time   09/23/12 11:41 AM      Component Value Range Comment   Fecal Occult Bld NEGATIVE  NEGATIVE     Radiological Exams on Admission: Ct Abdomen Pelvis Wo Contrast  09/23/2012  *RADIOLOGY REPORT*  Clinical Data: Generalized abdominal pain, vomiting, hematuria.  CT ABDOMEN AND PELVIS WITHOUT CONTRAST  Technique:  Multidetector CT imaging of the abdomen and pelvis was performed following the standard protocol without intravenous contrast.  Comparison: 05/27/2006  Findings: Small to moderate hiatal hernia, stable.  Dependent atelectasis in the lung bases.  No effusions.  Heart is mildly enlarged.  Gallstones fill the gallbladder, stable.  Liver, stomach, spleen, pancreas, adrenals have an unremarkable unenhanced appearance.  Mild left hydronephrosis and perinephric stranding.  The left ureter is decompressed.  No definite visualized ureteral stone. Multiple calcifications in the anatomic pelvis felt represent  phleboliths.  Urinary bladder is grossly unremarkable. Areas of scarring in the right kidney without hydronephrosis.  Prior hysterectomy.  Scattered sigmoid diverticula.  No active diverticulitis.  Small bowel is decompressed.  No free fluid, free air or adenopathy.  Aorta and iliac vessels are heavily calcified, non-aneurysmal.  The degenerative changes in the lumbar spine.  No acute bony abnormality.  IMPRESSION: Mild left hydronephrosis and perinephric stranding.  The left ureter is decompressed, and I see no definite ureteral stones. Question recent passage of stone.  Cholelithiasis.  Small to moderate hiatal hernia.  Sigmoid diverticulosis.   Original Report Authenticated By: Charlett Nose, M.D.     Assessment/Plan Principal Problem:  *Pyelonephritis Active Problems:  HTN (hypertension)  Hematuria  Hydronephrosis, left  ARF (acute renal failure)  CKD (chronic kidney disease) stage 3, GFR 30-59 ml/min   Pyelonephritis -Admit to med surg. -IVF, antiemetics, pain meds PRN. -Will start on rocephin pending urine cx data. -Blood cx ordered.  Mild Hydronephrosis -She probably passed a stone. -Does have a h/o kidney stones. -No need for GU consult at this time.  Acute on CKD Stage III-IV -Suspect acute  component related to intravascular volume depletion from dehydration as she has been without PO intake for 5 days. -Follow Cr with IVF.  DVT Prophylaxis -SCDs given hematuria.  Code Status -Full code  Time Spent on Admission: 75 minutes  HERNANDEZ ACOSTA,Saray Capasso [facility] Pager: [phone] 09/23/2012, 1:52 PM

## 2012-09-23 NOTE — ED Notes (Signed)
Dinner tray ordered.

## 2012-09-24 ENCOUNTER — Inpatient Hospital Stay (HOSPITAL_COMMUNITY): Payer: Medicare Other

## 2012-09-24 LAB — BASIC METABOLIC PANEL
Calcium: 9.7 mg/dL (ref 8.4–10.5)
GFR calc non Af Amer: 9 mL/min — ABNORMAL LOW (ref >90)
Glucose, Bld: 157 mg/dL — ABNORMAL HIGH (ref 70–99)
Sodium: 139 mEq/L (ref 135–145)

## 2012-09-24 LAB — CBC
MCH: 32.7 pg (ref 26.0–34.0)
MCHC: 33.8 g/dL (ref 30.0–36.0)
Platelets: 105 10*3/uL — ABNORMAL LOW (ref 150–400)
RBC: 3.03 MIL/uL — ABNORMAL LOW (ref 3.87–5.11)

## 2012-09-24 LAB — INFLUENZA PANEL BY PCR (TYPE A & B)
Influenza A By PCR: NEGATIVE
Influenza B By PCR: NEGATIVE

## 2012-09-24 MED ORDER — BOOST / RESOURCE BREEZE PO LIQD
1.0000 | Freq: Three times a day (TID) | ORAL | Status: DC
  Administered 2012-09-24 – 2012-10-01 (×19): 1 via ORAL

## 2012-09-24 NOTE — Progress Notes (Signed)
INITIAL NUTRITION ASSESSMENT  DOCUMENTATION CODES Per approved criteria  -Not Applicable   INTERVENTION: 1. Resource breeze supplement TID. Each supplement provides 250 kcal and 9 grams of protein.   NUTRITION DIAGNOSIS: Inadequate oral intake related to nausea, vomiting and pain 2/2 pyelonephritis as evidenced by no food or beverage intake for 5 days.   Goal: Pt meet >/= 90% of estimated intake  Monitor:  PO intake, weight, labs   Reason for Assessment: Malnutrition Screening Tool (MST=3)  79 y.o. female  Admitting Dx: Pyelonephritis  ASSESSMENT: Pt is 79 yo female c/o severe left plank pain, dysuria, blood in urine, n/v.  Pt reports she has not had anything to eat or drink in 5 days. Pt has been diagnosed with pyelonephritis, acute on chronic (stage 3) kidney failure, and dehydration. Of note the CT scan showed lrft mild hydronephrosis but no stones visualized.  RD spoke with pt. PT confirmed that she had not had anything to eat or drink for the 5 days prior to admission. Pt stated she is still in pain but her appetite has started to come back. Pt ate ~50% of breakfast tray.  She stated that she did not like the food but tried to eat as much as she could. Pt has mainly been drinking orange juice and pepsi for the past 2 days.  Pt refused Ensure Complete supplement but was very open to a [employer] supplement.    Height: Ht Readings from Last 1 Encounters:  09/23/12 5\' 4"  (1.626 m)    Weight: Wt Readings from Last 1 Encounters:  09/23/12 147 lb 11.3 oz (67 kg)    Ideal Body Weight: 120 lbs   % Ideal Body Weight: 122%  Wt Readings from Last 10 Encounters:  09/23/12 147 lb 11.3 oz (67 kg)  08/31/12 151 lb (68.493 kg)  02/10/12 152 lb (68.947 kg)  02/04/12 154 lb 3.2 oz (69.945 kg)  01/31/12 151 lb (68.493 kg)  10/10/11 157 lb 2 oz (71.271 kg)  09/12/11 157 lb (71.215 kg)  09/09/11 157 lb 12.8 oz (71.578 kg)  09/03/11 156 lb (70.761 kg)  07/04/11 158 lb  (71.668 kg)    Usual Body Weight: ~150 lbs   % Usual Body Weight: 98%  BMI:  Body mass index is 25.35 kg/(m^2). - overweight   Estimated Nutritional Needs: Kcal: 1650 -1850 Protein: 65 - 80 grams daily  Fluid: 500 ml + urine output   Skin: intact  Diet Order: Cardiac  EDUCATION NEEDS: -No education needs identified at this time   Intake/Output Summary (Last 24 hours) at 09/24/12 0847 Last data filed at 09/24/12 0559  Gross per 24 hour  Intake   1425 ml  Output      0 ml  Net   1425 ml    Last BM: 09/23/2012   Labs:   Lab 09/24/12 0655 09/23/12 0840  NA 139 140  K 3.5 3.6  CL 103 102  CO2 22 26  BUN 49* 34*  CREATININE 4.06* 2.78*  CALCIUM 9.7 10.3  MG -- --  PHOS -- --  GLUCOSE 157* 176*    CBG (last 3)  No results found for this basename: GLUCAP:3 in the last 72 hours  Scheduled Meds:   . atorvastatin  20 mg Oral q1800  . carvedilol  25 mg Oral BID WC  . cefTRIAXone (ROCEPHIN)  IV  1 g Intravenous Q24H  . cloNIDine  0.1 mg Oral QHS  . clopidogrel  75 mg Oral Daily  .  diltiazem  60 mg Oral Daily  . influenza  inactive virus vaccine  0.5 mL Intramuscular Tomorrow-1000  . pneumococcal 23 valent vaccine  0.5 mL Intramuscular Tomorrow-1000  . sodium chloride  3 mL Intravenous Q12H    Continuous Infusions:   . sodium chloride 100 mL/hr at 09/24/12 1610    Past Medical History  Diagnosis Date  . Peripheral vascular disease   . Coronary artery disease   . Heart murmur   . Chronic kidney disease 1964    renal artery left kidney stent  . Hypertension   . Stroke   . Myocardial infarction   . DVT (deep venous thrombosis)   . Aneurysm   . Anemia   . Cancer 1963    ovarian cancer  . Arthritis     Past Surgical History  Procedure Date  . Abdominal hysterectomy   . Bladder surgery     bladder tack  . Kidney surgery     right renal artery stent  . Coronary angioplasty with stent placement   . Leg stents   . Breast lumpectomy     left  .  Appendectomy     Belenda Cruise  Dietetic Intern Pager: [phone]

## 2012-09-24 NOTE — Consult Note (Signed)
Urology Consult   Physician requesting consult: Ardyth Harps  Reason for consult: left sided hydroureteronephrosis   History of Present Illness: [name] is a 79 y.o. AA female with PMH significant for HTN, PVD, CAD, CKD, and CVA who was admitted yesterday for treatment of dehydration and progressive renal insufficiency.  Pt states she began to note malaise and decreased appetite last Thurs.  She also had an episode of dysuria with lower abdominal pain last Thurs.  She had gross hematuria after that episode that had resolved by Fri.  Her dysuria also improved.  Her other sx of malaise and poor appetite worsened and she developed a headache and dizziness.  This prompted her to present to the ED yesterday.  She was found to have progressive renal insufficiency, dehydration, and a UTI/pyelonephritis.  Urine culture shows >100,000 colonies E coli with sensitivities pending.  She is receiving IV Rocephin.  CT A/P without contrast revealed mild left sided hydroureteronephrosis with no evidence of stones.  RUS performed today reveals suspected left parapelvic renal cysts in an addition to mild left renal collecting system fullness.  She currently states she feels better and is resting comfortably in bed.  She thinks she passed a kidney stones approx 20 years ago but is not certain.  She denies any other episodes of hematuria.  She denies a history of voiding or storage urinary symptoms, UTIs, STDs, GU malignancy/trauma/surgery.  She currently denies dizziness, headache, F/C, CP, SOB, N/V, diarrhea/constipation, flank pain, and back pain.  Urine in the foley is clear/yellow.  Past Medical History  Diagnosis Date  . Peripheral vascular disease   . Coronary artery disease   . Heart murmur   . Chronic kidney disease 1964    renal artery left kidney stent  . Hypertension   . Stroke   . Myocardial infarction   . DVT (deep venous thrombosis)   . Aneurysm   . Anemia   . Cancer 1963    ovarian cancer   . Arthritis     Past Surgical History  Procedure Date  . Abdominal hysterectomy   . Bladder surgery     bladder tack  . Kidney surgery     right renal artery stent  . Coronary angioplasty with stent placement   . Leg stents   . Breast lumpectomy     left  . Appendectomy    Home medications:  Prior to Admission medications   Medication Sig Start Date End Date Taking? Authorizing Provider  carvedilol (COREG) 25 MG tablet Take 25 mg by mouth 2 (two) times daily with a meal.  08/26/11  Yes Ronnald Nian, MD  cloNIDine (CATAPRES) 0.1 MG tablet Take 0.1 mg by mouth at bedtime.    Yes Historical Provider, MD  clopidogrel (PLAVIX) 75 MG tablet Take 75 mg by mouth daily.   Yes Historical Provider, MD  diltiazem (CARDIZEM SR) 60 MG 12 hr capsule Take 60 mg by mouth daily.   Yes Historical Provider, MD  ibuprofen (ADVIL,MOTRIN) 800 MG tablet Take 1 tablet (800 mg total) by mouth as needed. For pain. 08/31/12  Yes Ronnald Nian, MD  lisinopril (PRINIVIL,ZESTRIL) 40 MG tablet Take 40 mg by mouth daily.   Yes Historical Provider, MD  simvastatin (ZOCOR) 40 MG tablet Take 40 mg by mouth daily.    Yes Historical Provider, MD     Current Hospital Medications: Scheduled Meds:   . atorvastatin  20 mg Oral q1800  . cefTRIAXone (ROCEPHIN)  IV  1 g  Intravenous Q24H  . clopidogrel  75 mg Oral Daily  . feeding supplement  1 Container Oral TID BM  . sodium chloride  3 mL Intravenous Q12H   Continuous Infusions:   . sodium chloride 100 mL/hr at 09/24/12 0823   PRN Meds:.acetaminophen, acetaminophen, ondansetron (ZOFRAN) IV, ondansetron, oxyCODONE, senna-docusate  Allergies:  Allergies  Allergen Reactions  . Codeine Rash    Family History  Problem Relation Age of Onset  . Irritable bowel syndrome Daughter   . Colon cancer Neg Hx     Social History:  reports that she has never smoked. She has never used smokeless tobacco. She reports that she does not drink alcohol or use illicit  drugs.  ROS: A complete review of systems was performed.  All systems are negative except for pertinent findings as noted.  Physical Exam:  Vital signs in last 24 hours: Temp:  [98.2 F (36.8 C)-103.1 F (39.5 C)] 98.4 F (36.9 C) (01/30 0824) Pulse Rate:  [78-102] 82  (01/30 0824) Resp:  [18-20] 18  (01/30 0824) BP: (94-167)/(58-98) 106/58 mmHg (01/30 0824) SpO2:  [90 %-94 %] 94 % (01/30 0824) Weight:  [67 kg (147 lb 11.3 oz)] 67 kg (147 lb 11.3 oz) (01/29 2133) General:  Alert and oriented, No acute distress HEENT: Normocephalic, atraumatic Neck: No JVD or lymphadenopathy Cardiovascular: Regular rate and rhythm Lungs: Clear bilaterally Abdomen: Soft, nontender, nondistended, no abdominal masses Back: No CVA tenderness Extremities: No edema Neurologic: Grossly intact  Laboratory Data:   [facility] 09/24/12 0655 09/23/12 0840  WBC 12.0* 12.8*  HGB 9.9* 10.8*  HCT 29.3* 32.7*  PLT 105* 102*     Basename 09/24/12 0655 09/23/12 0840  NA 139 140  K 3.5 3.6  CL 103 102  GLUCOSE 157* 176*  BUN 49* 34*  CALCIUM 9.7 10.3  CREATININE 4.06* 2.78*     Results for orders placed during the hospital encounter of 09/23/12 (from the past 24 hour(s))  CULTURE, BLOOD (ROUTINE X 2)     Status: Normal (Preliminary result)   Collection Time   09/23/12  5:35 PM      Component Value Range   Specimen Description BLOOD RIGHT ARM     Special Requests BOTTLES DRAWN AEROBIC AND ANAEROBIC 10CC     Culture  Setup Time 09/23/2012 22:17     Culture       Value:        BLOOD CULTURE RECEIVED NO GROWTH TO DATE CULTURE WILL BE HELD FOR 5 DAYS BEFORE ISSUING A FINAL NEGATIVE REPORT   Report Status PENDING    CULTURE, BLOOD (ROUTINE X 2)     Status: Normal (Preliminary result)   Collection Time   09/23/12  5:45 PM      Component Value Range   Specimen Description BLOOD RIGHT HAND     Special Requests BOTTLES DRAWN AEROBIC AND ANAEROBIC 10CC     Culture  Setup Time 09/23/2012 22:17     Culture        Value:        BLOOD CULTURE RECEIVED NO GROWTH TO DATE CULTURE WILL BE HELD FOR 5 DAYS BEFORE ISSUING A FINAL NEGATIVE REPORT   Report Status PENDING    INFLUENZA PANEL BY PCR     Status: Normal   Collection Time   09/23/12 10:16 PM      Component Value Range   Influenza A By PCR NEGATIVE  NEGATIVE   Influenza B By PCR NEGATIVE  NEGATIVE   H1N1 flu  by pcr NOT DETECTED  NOT DETECTED  BASIC METABOLIC PANEL     Status: Abnormal   Collection Time   09/24/12  6:55 AM      Component Value Range   Sodium 139  135 - 145 mEq/L   Potassium 3.5  3.5 - 5.1 mEq/L   Chloride 103  96 - 112 mEq/L   CO2 22  19 - 32 mEq/L   Glucose, Bld 157 (*) 70 - 99 mg/dL   BUN 49 (*) 6 - 23 mg/dL   Creatinine, Ser 1.61 (*) 0.50 - 1.10 mg/dL   Calcium 9.7  8.4 - 09.6 mg/dL   GFR calc non Af Amer 9 (*) >90 mL/min   GFR calc Af Amer 11 (*) >90 mL/min  CBC     Status: Abnormal   Collection Time   09/24/12  6:55 AM      Component Value Range   WBC 12.0 (*) 4.0 - 10.5 K/uL   RBC 3.03 (*) 3.87 - 5.11 MIL/uL   Hemoglobin 9.9 (*) 12.0 - 15.0 g/dL   HCT 04.5 (*) 40.9 - 81.1 %   MCV 96.7  78.0 - 100.0 fL   MCH 32.7  26.0 - 34.0 pg   MCHC 33.8  30.0 - 36.0 g/dL   RDW 91.4  78.2 - 95.6 %   Platelets 105 (*) 150 - 400 K/uL   Recent Results (from the past 240 hour(s))  URINE CULTURE     Status: Normal (Preliminary result)   Collection Time   09/23/12 10:03 AM      Component Value Range Status Comment   Specimen Description URINE, CLEAN CATCH   Final    Special Requests NONE   Final    Culture  Setup Time 09/23/2012 16:20   Final    Colony Count >=100,000 COLONIES/ML   Final    Culture ESCHERICHIA COLI   Final    Report Status PENDING   Incomplete   CULTURE, BLOOD (ROUTINE X 2)     Status: Normal (Preliminary result)   Collection Time   09/23/12  5:35 PM      Component Value Range Status Comment   Specimen Description BLOOD RIGHT ARM   Final    Special Requests BOTTLES DRAWN AEROBIC AND ANAEROBIC 10CC    Final    Culture  Setup Time 09/23/2012 22:17   Final    Culture     Final    Value:        BLOOD CULTURE RECEIVED NO GROWTH TO DATE CULTURE WILL BE HELD FOR 5 DAYS BEFORE ISSUING A FINAL NEGATIVE REPORT   Report Status PENDING   Incomplete   CULTURE, BLOOD (ROUTINE X 2)     Status: Normal (Preliminary result)   Collection Time   09/23/12  5:45 PM      Component Value Range Status Comment   Specimen Description BLOOD RIGHT HAND   Final    Special Requests BOTTLES DRAWN AEROBIC AND ANAEROBIC 10CC   Final    Culture  Setup Time 09/23/2012 22:17   Final    Culture     Final    Value:        BLOOD CULTURE RECEIVED NO GROWTH TO DATE CULTURE WILL BE HELD FOR 5 DAYS BEFORE ISSUING A FINAL NEGATIVE REPORT   Report Status PENDING   Incomplete     Renal Function:  Basename 09/24/12 0655 09/23/12 0840  CREATININE 4.06* 2.78*   Estimated Creatinine Clearance: 9.4 ml/min (by C-G formula  based on Cr of 4.06).  Radiologic Imaging: Ct Abdomen Pelvis Wo Contrast  09/23/2012  *RADIOLOGY REPORT*  Clinical Data: Generalized abdominal pain, vomiting, hematuria.  CT ABDOMEN AND PELVIS WITHOUT CONTRAST  Technique:  Multidetector CT imaging of the abdomen and pelvis was performed following the standard protocol without intravenous contrast.  Comparison: 05/27/2006  Findings: Small to moderate hiatal hernia, stable.  Dependent atelectasis in the lung bases.  No effusions.  Heart is mildly enlarged.  Gallstones fill the gallbladder, stable.  Liver, stomach, spleen, pancreas, adrenals have an unremarkable unenhanced appearance.  Mild left hydronephrosis and perinephric stranding.  The left ureter is decompressed.  No definite visualized ureteral stone. Multiple calcifications in the anatomic pelvis felt represent phleboliths.  Urinary bladder is grossly unremarkable. Areas of scarring in the right kidney without hydronephrosis.  Prior hysterectomy.  Scattered sigmoid diverticula.  No active diverticulitis.  Small  bowel is decompressed.  No free fluid, free air or adenopathy.  Aorta and iliac vessels are heavily calcified, non-aneurysmal.  The degenerative changes in the lumbar spine.  No acute bony abnormality.  IMPRESSION: Mild left hydronephrosis and perinephric stranding.  The left ureter is decompressed, and I see no definite ureteral stones. Question recent passage of stone.  Cholelithiasis.  Small to moderate hiatal hernia.  Sigmoid diverticulosis.   Original Report Authenticated By: Charlett Nose, M.D.    US Renal  09/24/2012  *RADIOLOGY REPORT*  Clinical Data: Worsening acute renal failure.  Question hydronephrosis.  RENAL/URINARY TRACT ULTRASOUND COMPLETE  Comparison:  09/23/2012 CT.  06/05/2010 abdominal sonogram.  Findings:  Right Kidney:  11.6 cm.  Mild increased echogenicity. No hydronephrosis or renal mass.  Left Kidney:  10.9 cm.  Parapelvic cysts suspected in addition to mild left renal collecting system fullness.  Bladder:  Post void and decompressed.  IMPRESSION: Left-sided parapelvic cysts suspected in addition to mild left renal collecting system fullness.   Original Report Authenticated By: Lacy Duverney, M.D.    Dg Chest Port 1 View  09/23/2012  *RADIOLOGY REPORT*  Clinical Data: Cough.  Fever.  PORTABLE CHEST - 1 VIEW  Comparison: 06/27/2011  Findings: Tortuous thoracic aorta noted.  Mild cardiomegaly noted with cardiothoracic index 60% on this AP radiograph.  Lungs appear clear.  Thoracic spondylosis noted.  No blunting of the lateral costophrenic angles.  IMPRESSION:  1.  Tortuous thoracic aorta with atherosclerotic calcification. 2.  Mild cardiomegaly, without edema. 3.  Thoracic spondylosis.   Original Report Authenticated By: Gaylyn Rong, M.D.     Impression/Assessment:  1.) UTI/pyleonephritis 2.) Mild left hydro with questionable recent stone passage 3.) progressive renal insufficiency  Plan:  1.) Continue Rocephin and f/u urine culture.  Tx with appropriate ABx for 7 days and  repeat UA after completion to ensure resolution.   2.) Pt may have passed a stone last Thurs.  She has no additional stones noted on imaging. Her gross hematuria has resolved but she does still have significant microhematuria, however, both are possibly related to her current infection.  A repeat UA after tx of infection should be checked to ensure hematuria has resolved. The left sided hydro is mild and there is suspicion of a parapelvic cyst.  Her progressive renal insufficiency is not likely related to this.  If the hydro worsens, then she may require stent placement.    3.) See #2 above.  Silas Flood 09/24/2012, 3:12 PM

## 2012-09-24 NOTE — Progress Notes (Signed)
Agree with dietetic intern note.   Clarene Duke RD, LDN Pager [phone] After Hours pager [phone]

## 2012-09-24 NOTE — Progress Notes (Signed)
[facility]             Progress Note   Subjective: No complaints. Was febrile last pm to 103.1.  Objective: Vital signs in last 24 hours: Temp:  [98 F (36.7 C)-103.1 F (39.5 C)] 98.4 F (36.9 C) (01/30 0824) Pulse Rate:  [70-102] 82  (01/30 0824) Resp:  [18-20] 18  (01/30 0824) BP: (94-167)/(56-98) 106/58 mmHg (01/30 0824) SpO2:  [90 %-98 %] 94 % (01/30 0824) Weight:  [67 kg (147 lb 11.3 oz)] 67 kg (147 lb 11.3 oz) (01/29 2133) Weight change:  Last BM Date: 09/23/12  Intake/Output from previous day: 01/29 0701 - 01/30 0700 In: 1425 [P.O.:240; I.V.:1185] Out: -  Total I/O In: 240 [P.O.:240] Out: -    Physical Exam: General: Alert, awake, oriented x3, in no acute distress. HEENT: No bruits, no goiter. Heart: Regular rate and rhythm, without murmurs, rubs, gallops. Lungs: Clear to auscultation bilaterally. Abdomen: Soft, nontender, nondistended, positive bowel sounds. Extremities: No clubbing cyanosis or edema with positive pedal pulses. Neuro: Grossly intact, nonfocal.    Lab Results: Basic Metabolic Panel:  Basename 09/24/12 0655 09/23/12 0840  NA 139 140  K 3.5 3.6  CL 103 102  CO2 22 26  GLUCOSE 157* 176*  BUN 49* 34*  CREATININE 4.06* 2.78*  CALCIUM 9.7 10.3  MG -- --  PHOS -- --   Liver Function Tests:  [facility] 09/23/12 0840  AST 23  ALT 15  ALKPHOS 96  BILITOT 0.7  PROT 6.8  ALBUMIN 3.1*    Basename 09/23/12 0840  LIPASE 18  AMYLASE --   CBC:  Basename 09/24/12 0655 09/23/12 0840  WBC 12.0* 12.8*  NEUTROABS -- 10.6*  HGB 9.9* 10.8*  HCT 29.3* 32.7*  MCV 96.7 97.6  PLT 105* 102*   Urinalysis:  Basename 09/23/12 1003  COLORURINE YELLOW  LABSPEC 1.015  PHURINE 5.5  GLUCOSEU NEGATIVE  HGBUR LARGE*  BILIRUBINUR NEGATIVE  KETONESUR NEGATIVE  PROTEINUR 100*  UROBILINOGEN 1.0  NITRITE NEGATIVE  LEUKOCYTESUR LARGE*    Recent Results (from the past 240 hour(s))  CULTURE, BLOOD (ROUTINE X 2)     Status:  Normal (Preliminary result)   Collection Time   09/23/12  5:35 PM      Component Value Range Status Comment   Specimen Description BLOOD RIGHT ARM   Final    Special Requests BOTTLES DRAWN AEROBIC AND ANAEROBIC 10CC   Final    Culture  Setup Time 09/23/2012 22:17   Final    Culture     Final    Value:        BLOOD CULTURE RECEIVED NO GROWTH TO DATE CULTURE WILL BE HELD FOR 5 DAYS BEFORE ISSUING A FINAL NEGATIVE REPORT   Report Status PENDING   Incomplete   CULTURE, BLOOD (ROUTINE X 2)     Status: Normal (Preliminary result)   Collection Time   09/23/12  5:45 PM      Component Value Range Status Comment   Specimen Description BLOOD RIGHT HAND   Final    Special Requests BOTTLES DRAWN AEROBIC AND ANAEROBIC 10CC   Final    Culture  Setup Time 09/23/2012 22:17   Final    Culture     Final    Value:        BLOOD CULTURE RECEIVED NO GROWTH TO DATE CULTURE WILL BE HELD FOR 5 DAYS BEFORE ISSUING A FINAL NEGATIVE REPORT   Report Status PENDING   Incomplete     Studies/Results:  Ct Abdomen Pelvis Wo Contrast  09/23/2012  *RADIOLOGY REPORT*  Clinical Data: Generalized abdominal pain, vomiting, hematuria.  CT ABDOMEN AND PELVIS WITHOUT CONTRAST  Technique:  Multidetector CT imaging of the abdomen and pelvis was performed following the standard protocol without intravenous contrast.  Comparison: 05/27/2006  Findings: Small to moderate hiatal hernia, stable.  Dependent atelectasis in the lung bases.  No effusions.  Heart is mildly enlarged.  Gallstones fill the gallbladder, stable.  Liver, stomach, spleen, pancreas, adrenals have an unremarkable unenhanced appearance.  Mild left hydronephrosis and perinephric stranding.  The left ureter is decompressed.  No definite visualized ureteral stone. Multiple calcifications in the anatomic pelvis felt represent phleboliths.  Urinary bladder is grossly unremarkable. Areas of scarring in the right kidney without hydronephrosis.  Prior hysterectomy.  Scattered sigmoid  diverticula.  No active diverticulitis.  Small bowel is decompressed.  No free fluid, free air or adenopathy.  Aorta and iliac vessels are heavily calcified, non-aneurysmal.  The degenerative changes in the lumbar spine.  No acute bony abnormality.  IMPRESSION: Mild left hydronephrosis and perinephric stranding.  The left ureter is decompressed, and I see no definite ureteral stones. Question recent passage of stone.  Cholelithiasis.  Small to moderate hiatal hernia.  Sigmoid diverticulosis.   Original Report Authenticated By: Charlett Nose, M.D.    Dg Chest Port 1 View  09/23/2012  *RADIOLOGY REPORT*  Clinical Data: Cough.  Fever.  PORTABLE CHEST - 1 VIEW  Comparison: 06/27/2011  Findings: Tortuous thoracic aorta noted.  Mild cardiomegaly noted with cardiothoracic index 60% on this AP radiograph.  Lungs appear clear.  Thoracic spondylosis noted.  No blunting of the lateral costophrenic angles.  IMPRESSION:  1.  Tortuous thoracic aorta with atherosclerotic calcification. 2.  Mild cardiomegaly, without edema. 3.  Thoracic spondylosis.   Original Report Authenticated By: Gaylyn Rong, M.D.     Medications: Scheduled Meds:   . atorvastatin  20 mg Oral q1800  . carvedilol  25 mg Oral BID WC  . cefTRIAXone (ROCEPHIN)  IV  1 g Intravenous Q24H  . cloNIDine  0.1 mg Oral QHS  . clopidogrel  75 mg Oral Daily  . diltiazem  60 mg Oral Daily  . feeding supplement  1 Container Oral TID BM  . influenza  inactive virus vaccine  0.5 mL Intramuscular Tomorrow-1000  . pneumococcal 23 valent vaccine  0.5 mL Intramuscular Tomorrow-1000  . sodium chloride  3 mL Intravenous Q12H   Continuous Infusions:   . sodium chloride 100 mL/hr at 09/24/12 0823   PRN Meds:.acetaminophen, acetaminophen, ondansetron (ZOFRAN) IV, ondansetron, oxyCODONE, senna-docusate  Assessment/Plan:  Principal Problem:  *Pyelonephritis Active Problems:  HTN (hypertension)  Hematuria  Hydronephrosis, left  ARF (acute renal  failure)  CKD (chronic kidney disease) stage 3, GFR 30-59 ml/min   Pyelonephritis -Continue rocephin pending cx data. -PRN pain meds and antiemetics.  Acute on CKD Stage III -Marked worsening in Cr overnight despite IVF. -Cr up to 4.09 from 2.78. Baseline Cr around 1.1-1.5. -There was some left hydronephrosis on CT scan, but no stones. -Will place foley. -STAT renal US to see if any worsening of hydro. -Will request GU consultation. -May need renal consult if no improvement. -Will continue IVF for now.  Fever -Suspect related to pyelo. -Per RN had cough last pm, so was placed on droplet precautions pending result of Influenza PCR. Not highly concerned for flu so have not empirically started Tamiflu. -CXR without acute disease.   Time spent coordinating care: 35 minutes.  LOS: 1 day   [facility] Pager: [phone] 09/24/2012, 10:27 AM

## 2012-09-25 LAB — BASIC METABOLIC PANEL
BUN: 58 mg/dL — ABNORMAL HIGH (ref 6–23)
Calcium: 9.4 mg/dL (ref 8.4–10.5)
Chloride: 107 mEq/L (ref 96–112)
Creatinine, Ser: 5.17 mg/dL — ABNORMAL HIGH (ref 0.50–1.10)
GFR calc Af Amer: 8 mL/min — ABNORMAL LOW (ref >90)

## 2012-09-25 LAB — CBC
HCT: 30.6 % — ABNORMAL LOW (ref 36.0–46.0)
MCHC: 33 g/dL (ref 30.0–36.0)
Platelets: 130 10*3/uL — ABNORMAL LOW (ref 150–400)
RDW: 13.3 % (ref 11.5–15.5)
WBC: 10.2 10*3/uL (ref 4.0–10.5)

## 2012-09-25 LAB — URINE CULTURE: Colony Count: >=100000

## 2012-09-25 NOTE — Consult Note (Signed)
[facility] Consult Note    Date: 09/25/2012                  Patient Name:  [name]  MRN: [id number]  DOB: 01-Jan-1924  Age / Sex: 79 y.o., female         PCP: Carollee Herter, MD                 Service Requesting Consult: Internal Medicine/[facility]                 Reason for Consult: Acute Renal Failure            History of Present Illness: Patient is a 79 y.o. female with a PMHx of HTN, PVD, CAD, carotid artery stenosis R>L who was admitted to [facility] on 09/23/2012 for evaluation of dehydration and acute renal failure.   She reports that last Thursday, 8 days ago, she had an episode of severe left flank pain with gross hematuria. This resolved, but she continued having ongoing fever, chills, nausea and no oral intake x3-4 days prior to admission. She also reports increased urinary frequency, dysuria since Thursday. She developed myalgias, headaches and dizziness which prompted her to come to the hospital. Of note, she reports that in the last 2 months, her urine has appeared foamy and a darker, orange color. She reports taking ibuprofen 800mg  for 5 doses in the week prior to admission. She was taking her BP meds (including lisinopril) on and off, given her poor oral intake.   On admission, patient found to have Cr: 2.78 from 1.53 in January 2013. Renal US showed left parapelvic renal cysts and mild left renal collection system fullness. On abdominal CT (without contrast), there was mild Left hydronephrosis and perinephric stranding. No stone on CT but question as to recent stone passage. UA showed large hemoglobin with 7-10 RBC, large leuks and 100 protein. Patient was empirically started on Ceftriaxone for pyelo. Urine Culture grew greater than 100,000 EColi, sensitive to Ceftriaxone. Patient was evaluated by urology who recommended continued antibiotic treatment.  Since admission, Creatinine has increased to 5.17 on 09/25/12 and nephrology was consulted  for acute renal failure.  She received 500 cc NS bolus on admission for concern for dehydration and started on NS at 100cc/hr. She did not receive any of her home antihypertensive due to BP's in the 90's/60's on 09/23/12.    Since she has been in the hospital, she denies any nausea, vomiting. Eating ok. No abdominal pain. No gross hematuria. No lower extremity swelling. Has had a non productive cough which sometimes makes her short of breath.  She does not appear uremic.  She is making urine but slightly low volume with 550 out last 24 hours, over 300 so far today.    Medications: Outpatient medications: Prescriptions prior to admission  Medication Sig Dispense Refill  . carvedilol (COREG) 25 MG tablet Take 25 mg by mouth 2 (two) times daily with a meal.       . cloNIDine (CATAPRES) 0.1 MG tablet Take 0.1 mg by mouth at bedtime.       . clopidogrel (PLAVIX) 75 MG tablet Take 75 mg by mouth daily.      Marland Kitchen diltiazem (CARDIZEM SR) 60 MG 12 hr capsule Take 60 mg by mouth daily.      Marland Kitchen ibuprofen (ADVIL,MOTRIN) 800 MG tablet Take 1 tablet (800 mg total) by mouth as needed. For pain.  30 tablet  5  . lisinopril (PRINIVIL,ZESTRIL)  40 MG tablet Take 40 mg by mouth daily.      . simvastatin (ZOCOR) 40 MG tablet Take 40 mg by mouth daily.         Current medications: Current Facility-Administered Medications  Medication Dose Route Frequency Provider Last Rate Last Dose  . 0.9 %  sodium chloride infusion   Intravenous Continuous Henderson Cloud, MD 100 mL/hr at 09/25/12 0636    . acetaminophen (TYLENOL) tablet 650 mg  650 mg Oral Q6H PRN Henderson Cloud, MD   650 mg at 09/24/12 1831   Or  . acetaminophen (TYLENOL) suppository 650 mg  650 mg Rectal Q6H PRN Henderson Cloud, MD      . atorvastatin (LIPITOR) tablet 20 mg  20 mg Oral q1800 Henderson Cloud, MD   20 mg at 09/24/12 1831  . cefTRIAXone (ROCEPHIN) 1 g in dextrose 5 % 50 mL IVPB  1 g Intravenous Q24H Henderson Cloud, MD   1 g at 09/25/12 1034  . clopidogrel (PLAVIX) tablet 75 mg  75 mg Oral Daily Henderson Cloud, MD   75 mg at 09/25/12 1034  . feeding supplement (RESOURCE BREEZE) liquid 1 Container  1 Container Oral TID BM Tonye Becket, RD   1 Container at 09/25/12 1034  . ondansetron (ZOFRAN) tablet 4 mg  4 mg Oral Q6H PRN Henderson Cloud, MD       Or  . ondansetron [facility]) injection 4 mg  4 mg Intravenous Q6H PRN Henderson Cloud, MD      . oxyCODONE (Oxy IR/ROXICODONE) immediate release tablet 5 mg  5 mg Oral Q4H PRN Henderson Cloud, MD      . senna-docusate (Senokot-S) tablet 1 tablet  1 tablet Oral QHS PRN Henderson Cloud, MD      . sodium chloride 0.9 % injection 3 mL  3 mL Intravenous Q12H Estela Isaiah Blakes, MD   3 mL at 09/25/12 1034      Allergies: Allergies  Allergen Reactions  . Codeine Rash      Past Medical History: Past Medical History  Diagnosis Date  . Peripheral vascular disease   . Coronary artery disease   . Heart murmur   . Chronic kidney disease 1964    renal artery left kidney stent  . Hypertension   . Stroke   . Myocardial infarction   . DVT (deep venous thrombosis)   . Aneurysm   . Anemia   . Cancer 1963    ovarian cancer  . Arthritis      Past Surgical History: Past Surgical History  Procedure Date  . Abdominal hysterectomy   . Bladder surgery     bladder tack  . Kidney surgery     right renal artery stent  . Coronary angioplasty with stent placement   . Leg stents   . Breast lumpectomy     left  . Appendectomy      Family History: Family History  Problem Relation Age of Onset  . Irritable bowel syndrome Daughter   . Colon cancer Neg Hx      Social History: History   Social History  . Marital Status: Widowed    Spouse Name: N/A    Number of Children: N/A  . Years of Education: N/A   Social History Main Topics  . Smoking status: Never Smoker   .  Smokeless tobacco: Never Used  . Alcohol  Use: No     Comment: social drinker  years ago-no longer  . Drug Use: Denies any IV drug use  . Sexually Active: Not on file   Lives at home with her 42 year old grand-daughter.   Review of Systems: As per HPI  Vital Signs: Blood pressure 135/76, pulse 84, temperature 98.1 F (36.7 C), temperature source Oral, resp. rate 20, height 5\' 4"  (1.626 m), weight 147 lb 11.3 oz (67 kg), SpO2 94.00%.  Weight trends: Filed Weights   09/23/12 1700 09/23/12 2133  Weight: 147 lb 11.3 oz (67 kg) 147 lb 11.3 oz (67 kg)    Physical Exam: General: Vital signs reviewed and noted. Well-developed, well-nourished, in no acute distress; alert, appropriate and cooperative throughout examination. A+Ox3  Head: Normocephalic, atraumatic.  Eyes: PERRL, EOMI  Nose: Mucous membranes moist  Throat: Oropharynx nonerythematous  Neck: No deformities, masses, or tenderness noted.Supple, No JVD  Lungs:  Normal respiratory effort. No crackles  Heart: RRR. S1 and S2 normal without murmur  Abdomen:  BS normoactive. Soft, Nondistended, non-tender.    Extremities: No pretibial edema.  Neurologic: A&O X3, CN II - XII are grossly intact.   Skin: No visible rashes, scars.    Lab results: Basic Metabolic Panel:  Lab 09/25/12 2956 09/24/12 0655 09/23/12 0840  NA 138 139 140  K 3.6 3.5 3.6  CL 107 103 102  CO2 19 22 26   GLUCOSE 123* 157* 176*  BUN 58* 49* 34*  CREATININE 5.17* 4.06* 2.78*  CALCIUM 9.4 9.7 10.3  MG -- -- --  PHOS -- -- --    Liver Function Tests:  Lab 09/23/12 0840  AST 23  ALT 15  ALKPHOS 96  BILITOT 0.7  PROT 6.8  ALBUMIN 3.1*    Lab 09/23/12 0840  LIPASE 18  AMYLASE --   No results found for this basename: AMMONIA:3 in the last 168 hours  CBC:  Lab 09/25/12 0620 09/24/12 0655 09/23/12 0840  WBC 10.2 12.0* 12.8*  NEUTROABS -- -- 10.6*  HGB 10.1* 9.9* 10.8*  HCT 30.6* 29.3* 32.7*  MCV 96.2 96.7 97.6  PLT 130* 105* 102*     Microbiology: Results for orders placed during the hospital encounter of 09/23/12  URINE CULTURE     Status: Normal   Collection Time   09/23/12 10:03 AM      Component Value Range Status Comment   Specimen Description URINE, CLEAN CATCH   Final    Special Requests NONE   Final    Culture  Setup Time 09/23/2012 16:20   Final    Colony Count >=100,000 COLONIES/ML   Final    Culture ESCHERICHIA COLI   Final    Report Status 09/25/2012 FINAL   Final    Organism ID, Bacteria ESCHERICHIA COLI   Final   CULTURE, BLOOD (ROUTINE X 2)     Status: Normal (Preliminary result)   Collection Time   09/23/12  5:35 PM      Component Value Range Status Comment   Specimen Description BLOOD RIGHT ARM   Final    Special Requests BOTTLES DRAWN AEROBIC AND ANAEROBIC 10CC   Final    Culture  Setup Time 09/23/2012 22:17   Final    Culture     Final    Value:        BLOOD CULTURE RECEIVED NO GROWTH TO DATE CULTURE WILL BE HELD FOR 5 DAYS BEFORE ISSUING A FINAL NEGATIVE REPORT   Report Status PENDING   Incomplete  CULTURE, BLOOD (ROUTINE X 2)     Status: Normal (Preliminary result)   Collection Time   09/23/12  5:45 PM      Component Value Range Status Comment   Specimen Description BLOOD RIGHT HAND   Final    Special Requests BOTTLES DRAWN AEROBIC AND ANAEROBIC 10CC   Final    Culture  Setup Time 09/23/2012 22:17   Final    Culture     Final    Value:        BLOOD CULTURE RECEIVED NO GROWTH TO DATE CULTURE WILL BE HELD FOR 5 DAYS BEFORE ISSUING A FINAL NEGATIVE REPORT   Report Status PENDING   Incomplete     Coagulation Studies: No results found for this basename: LABPROT:3,INR:3 in the last 72 hours  Urinalysis:  Basename 09/23/12 1003  COLORURINE YELLOW  LABSPEC 1.015  PHURINE 5.5  GLUCOSEU NEGATIVE  HGBUR LARGE*  BILIRUBINUR NEGATIVE  KETONESUR NEGATIVE  PROTEINUR 100*  UROBILINOGEN 1.0  NITRITE NEGATIVE  LEUKOCYTESUR LARGE*      Imaging: Ct Abdomen Pelvis Wo  Contrast  09/23/2012  *RADIOLOGY REPORT*  Clinical Data: Generalized abdominal pain, vomiting, hematuria.  CT ABDOMEN AND PELVIS WITHOUT CONTRAST  Technique:  Multidetector CT imaging of the abdomen and pelvis was performed following the standard protocol without intravenous contrast.  Comparison: 05/27/2006  Findings: Small to moderate hiatal hernia, stable.  Dependent atelectasis in the lung bases.  No effusions.  Heart is mildly enlarged.  Gallstones fill the gallbladder, stable.  Liver, stomach, spleen, pancreas, adrenals have an unremarkable unenhanced appearance.  Mild left hydronephrosis and perinephric stranding.  The left ureter is decompressed.  No definite visualized ureteral stone. Multiple calcifications in the anatomic pelvis felt represent phleboliths.  Urinary bladder is grossly unremarkable. Areas of scarring in the right kidney without hydronephrosis.  Prior hysterectomy.  Scattered sigmoid diverticula.  No active diverticulitis.  Small bowel is decompressed.  No free fluid, free air or adenopathy.  Aorta and iliac vessels are heavily calcified, non-aneurysmal.  The degenerative changes in the lumbar spine.  No acute bony abnormality.  IMPRESSION: Mild left hydronephrosis and perinephric stranding.  The left ureter is decompressed, and I see no definite ureteral stones. Question recent passage of stone.  Cholelithiasis.  Small to moderate hiatal hernia.  Sigmoid diverticulosis.   Original Report Authenticated By: Charlett Nose, M.D.    US Renal  09/24/2012  *RADIOLOGY REPORT*  Clinical Data: Worsening acute renal failure.  Question hydronephrosis.  RENAL/URINARY TRACT ULTRASOUND COMPLETE  Comparison:  09/23/2012 CT.  06/05/2010 abdominal sonogram.  Findings:  Right Kidney:  11.6 cm.  Mild increased echogenicity. No hydronephrosis or renal mass.  Left Kidney:  10.9 cm.  Parapelvic cysts suspected in addition to mild left renal collecting system fullness.  Bladder:  Post void and decompressed.   IMPRESSION: Left-sided parapelvic cysts suspected in addition to mild left renal collecting system fullness.   Original Report Authenticated By: Lacy Duverney, M.D.    Dg Chest Port 1 View  09/23/2012  *RADIOLOGY REPORT*  Clinical Data: Cough.  Fever.  PORTABLE CHEST - 1 VIEW  Comparison: 06/27/2011  Findings: Tortuous thoracic aorta noted.  Mild cardiomegaly noted with cardiothoracic index 60% on this AP radiograph.  Lungs appear clear.  Thoracic spondylosis noted.  No blunting of the lateral costophrenic angles.  IMPRESSION:  1.  Tortuous thoracic aorta with atherosclerotic calcification. 2.  Mild cardiomegaly, without edema. 3.  Thoracic spondylosis.   Original Report Authenticated By: Gaylyn Rong, M.D.  Assessment & Plan:  Pt is a 79 y.o. yo female with a PMHX of HTN, PVD, CAD who was admitted to [facility] on 09/23/2012 for evaluation of dehydration and acute renal failure.   # Acute Renal Failure on likely CKD 3: most recent Cr of 1.53 on 08/2011. Acute elevation in creatinine since admission from 2.78 to 5.17. Initial insult likely multifactorial from combination of dehydration, NSAID use, lisinopril use at home, UTI and questionable recent renal stone passage.  - obtain Pr/Cr ratio to better quantify protein in urine.  - check CK to rule out rhabdo  - repeat UA  tomorrow - no acute HD needs since no acute electrolyte abnormalities, no fluid overload, no signs of uremia.  Hopefully, will begin to plateau and improve and not require dialysis as support  # Anemia: baseline Hg: 10.5 and 12.  - iron panel in setting of underlying chronic kidney disease # Pyelonephritis: urine culture growing E-Coli sensitive to ceftriaxone.  - day 3 ceftriaxone  # Hypertension: currently not on home medications, which included: carvedilol, clonidine, lisinopril, diltiazem).   Marena Chancy, PGY-2 Family Medicine Resident  Patient seen and examined, agree with above note with above modifications.  79  year old BF with multiple medical problems but a creatinine of 1.5 a year ago, probably normal for her.  She now presents with AKI that is most likely multifactorial due to UTI/pyelo with questionable stone passage/NSAIDS and hypotension on an ACE.  She is making urine and is on appropriate antibiotics for her infection and does not appear to have an obstruction at present.  I have nothing further to add to the plan.  I will recheck U/A tomorrow to confirm urine clearing and will follow chemistries/UOP and clinical condition for dialysis need.  She does not have dialysis need at present.   Annie Sable, MD 09/25/2012

## 2012-09-25 NOTE — Progress Notes (Signed)
Subjective: Patient reports Alert and oriented.  No pain  Objective: Vital signs in last 24 hours: Temp:  [98.1 F (36.7 C)-99.5 F (37.5 C)] 98.1 F (36.7 C) (01/31 0550) Pulse Rate:  [84-89] 84  (01/31 0550) Resp:  [18-20] 20  (01/31 0550) BP: (109-135)/(53-76) 135/76 mmHg (01/31 0550) SpO2:  [94 %-95 %] 94 % (01/31 0550)  Intake/Output from previous day: 01/30 0701 - 01/31 0700 In: 3103.7 [P.O.:642; I.V.:2461.7] Out: 550 [Urine:550] Intake/Output this shift: Total I/O In: 190 [I.V.:190] Out: -   Physical Exam:  Lungs - Normal respiratory effort, chest expands symmetrically.  Abdomen - Soft, non-tender & non-distended.  No CVA tenderness. Foley draining well.  Urine grossly clear Creat: 5.17 Urine culture: >100,000 col. E. Coli sensitivity pending. Urinary output: 500 ml. Lab Results:  Basename 09/25/12 9604 09/24/12 0655 09/23/12 0840  HGB 10.1* 9.9* 10.8*  HCT 30.6* 29.3* 32.7*   BMET  Basename 09/25/12 0620 09/24/12 0655  NA 138 139  K 3.6 3.5  CL 107 103  CO2 19 22  GLUCOSE 123* 157*  BUN 58* 49*  CREATININE 5.17* 4.06*  CALCIUM 9.4 9.7   No results found for this basename: LABPT:3,INR:3 in the last 72 hours No results found for this basename: LABURIN:1 in the last 72 hours Results for orders placed during the hospital encounter of 09/23/12  URINE CULTURE     Status: Normal (Preliminary result)   Collection Time   09/23/12 10:03 AM      Component Value Range Status Comment   Specimen Description URINE, CLEAN CATCH   Final    Special Requests NONE   Final    Culture  Setup Time 09/23/2012 16:20   Final    Colony Count >=100,000 COLONIES/ML   Final    Culture ESCHERICHIA COLI   Final    Report Status PENDING   Incomplete   CULTURE, BLOOD (ROUTINE X 2)     Status: Normal (Preliminary result)   Collection Time   09/23/12  5:35 PM      Component Value Range Status Comment   Specimen Description BLOOD RIGHT ARM   Final    Special Requests BOTTLES  DRAWN AEROBIC AND ANAEROBIC 10CC   Final    Culture  Setup Time 09/23/2012 22:17   Final    Culture     Final    Value:        BLOOD CULTURE RECEIVED NO GROWTH TO DATE CULTURE WILL BE HELD FOR 5 DAYS BEFORE ISSUING A FINAL NEGATIVE REPORT   Report Status PENDING   Incomplete   CULTURE, BLOOD (ROUTINE X 2)     Status: Normal (Preliminary result)   Collection Time   09/23/12  5:45 PM      Component Value Range Status Comment   Specimen Description BLOOD RIGHT HAND   Final    Special Requests BOTTLES DRAWN AEROBIC AND ANAEROBIC 10CC   Final    Culture  Setup Time 09/23/2012 22:17   Final    Culture     Final    Value:        BLOOD CULTURE RECEIVED NO GROWTH TO DATE CULTURE WILL BE HELD FOR 5 DAYS BEFORE ISSUING A FINAL NEGATIVE REPORT   Report Status PENDING   Incomplete     Studies/Results: Ct Abdomen Pelvis Wo Contrast  09/23/2012  *RADIOLOGY REPORT*  Clinical Data: Generalized abdominal pain, vomiting, hematuria.  CT ABDOMEN AND PELVIS WITHOUT CONTRAST  Technique:  Multidetector CT imaging of the abdomen and pelvis  was performed following the standard protocol without intravenous contrast.  Comparison: 05/27/2006  Findings: Small to moderate hiatal hernia, stable.  Dependent atelectasis in the lung bases.  No effusions.  Heart is mildly enlarged.  Gallstones fill the gallbladder, stable.  Liver, stomach, spleen, pancreas, adrenals have an unremarkable unenhanced appearance.  Mild left hydronephrosis and perinephric stranding.  The left ureter is decompressed.  No definite visualized ureteral stone. Multiple calcifications in the anatomic pelvis felt represent phleboliths.  Urinary bladder is grossly unremarkable. Areas of scarring in the right kidney without hydronephrosis.  Prior hysterectomy.  Scattered sigmoid diverticula.  No active diverticulitis.  Small bowel is decompressed.  No free fluid, free air or adenopathy.  Aorta and iliac vessels are heavily calcified, non-aneurysmal.  The  degenerative changes in the lumbar spine.  No acute bony abnormality.  IMPRESSION: Mild left hydronephrosis and perinephric stranding.  The left ureter is decompressed, and I see no definite ureteral stones. Question recent passage of stone.  Cholelithiasis.  Small to moderate hiatal hernia.  Sigmoid diverticulosis.   Original Report Authenticated By: Charlett Nose, M.D.    US Renal  09/24/2012  *RADIOLOGY REPORT*  Clinical Data: Worsening acute renal failure.  Question hydronephrosis.  RENAL/URINARY TRACT ULTRASOUND COMPLETE  Comparison:  09/23/2012 CT.  06/05/2010 abdominal sonogram.  Findings:  Right Kidney:  11.6 cm.  Mild increased echogenicity. No hydronephrosis or renal mass.  Left Kidney:  10.9 cm.  Parapelvic cysts suspected in addition to mild left renal collecting system fullness.  Bladder:  Post void and decompressed.  IMPRESSION: Left-sided parapelvic cysts suspected in addition to mild left renal collecting system fullness.   Original Report Authenticated By: Lacy Duverney, M.D.    Dg Chest Port 1 View  09/23/2012  *RADIOLOGY REPORT*  Clinical Data: Cough.  Fever.  PORTABLE CHEST - 1 VIEW  Comparison: 06/27/2011  Findings: Tortuous thoracic aorta noted.  Mild cardiomegaly noted with cardiothoracic index 60% on this AP radiograph.  Lungs appear clear.  Thoracic spondylosis noted.  No blunting of the lateral costophrenic angles.  IMPRESSION:  1.  Tortuous thoracic aorta with atherosclerotic calcification. 2.  Mild cardiomegaly, without edema. 3.  Thoracic spondylosis.   Original Report Authenticated By: Gaylyn Rong, M.D.     Assessment/Plan:  Renal insufficiency.  Mild left hydronephrosis. UTI  Continue Rocephin till sensitivities results are available   LOS: 2 days   Gal Smolinski-HENRY 09/25/2012, 9:44 AM

## 2012-09-25 NOTE — Care Management Note (Unsigned)
    Page 1 of 2   10/01/2012     10:51:27 AM   CARE MANAGEMENT NOTE 10/01/2012  Patient:  [name], [name]   Account Number:  [id number]  Date Initiated:  09/25/2012  Documentation initiated by:  Letha Cape  Subjective/Objective Assessment:   dx pyelo  admit- lives with 77 year old grand daughter and other family members.     Action/Plan:   pt eval- rec pt/ot; rolling walker   Anticipated DC Date:  09/28/2012   Anticipated DC Plan:  HOME W HOME HEALTH SERVICES      DC Planning Services  CM consult      [facility] Choice  HOME HEALTH   Choice offered to / List presented to:  C-1 Patient   DME arranged  Levan Hurst      DME agency  [facility]     [facility] arranged  HH-1 RN  HH-2 PT  HH-3 OT      [facility] agency  [facility]   Status of service:  In process, will continue to follow Medicare Important Message given?   (If response is "NO", the following Medicare IM given date fields will be blank) Date Medicare IM given:   Date Additional Medicare IM given:    Discharge Disposition:    Per UR Regulation:  Reviewed for med. necessity/level of care/duration of stay  If discussed at Long Length of Stay Meetings, dates discussed:   09/29/2012  10/01/2012    Comments:  10/01/12 10:44 Letha Cape RN, BSN [phone] cre decreasing, ivf at 50 d/c'd, cont to follow I/O's, encourage po fluids, if cre conts to drop and electrolytes are normal ?dc home today.  09/30/12 14:35 Letha Cape RN, BSN [phone] patient lives with 77 yr old grand daughter and other family members.  Per physical therapy patient needs hhpt and a rolling walker.  Patient states she will need a RN for medication management, she chose [facility] from agency list, referral made to [facility] , Lupita Leash notified.  Soc will begin 24-48 post discharge.  Order also in for rolling walker. NCM will continue to follow for dc needs.

## 2012-09-25 NOTE — Progress Notes (Signed)
[facility]             Progress Note   Subjective: No complaints. Wants to go home.  Objective: Vital signs in last 24 hours: Temp:  [98.1 F (36.7 C)-99.5 F (37.5 C)] 98.1 F (36.7 C) (01/31 0550) Pulse Rate:  [84-89] 84  (01/31 0550) Resp:  [18-20] 20  (01/31 0550) BP: (109-135)/(53-76) 135/76 mmHg (01/31 0550) SpO2:  [94 %-95 %] 94 % (01/31 0550) Weight change:  Last BM Date: 09/24/12  Intake/Output from previous day: 01/30 0701 - 01/31 0700 In: 3103.7 [P.O.:642; I.V.:2461.7] Out: 550 [Urine:550] Total I/O In: 470 [P.O.:280; I.V.:190] Out: -    Physical Exam: General: Alert, awake, oriented x3, in no acute distress. HEENT: No bruits, no goiter. Heart: Regular rate and rhythm, without murmurs, rubs, gallops. Lungs: Clear to auscultation bilaterally. Abdomen: Soft, nontender, nondistended, positive bowel sounds. Extremities: No clubbing cyanosis or edema with positive pedal pulses. Neuro: Grossly intact, nonfocal.    Lab Results: Basic Metabolic Panel:  Basename 09/25/12 0620 09/24/12 0655  NA 138 139  K 3.6 3.5  CL 107 103  CO2 19 22  GLUCOSE 123* 157*  BUN 58* 49*  CREATININE 5.17* 4.06*  CALCIUM 9.4 9.7  MG -- --  PHOS -- --   Liver Function Tests:  Basename 09/23/12 0840  AST 23  ALT 15  ALKPHOS 96  BILITOT 0.7  PROT 6.8  ALBUMIN 3.1*    Basename 09/23/12 0840  LIPASE 18  AMYLASE --   CBC:  Basename 09/25/12 0620 09/24/12 0655 09/23/12 0840  WBC 10.2 12.0* --  NEUTROABS -- -- 10.6*  HGB 10.1* 9.9* --  HCT 30.6* 29.3* --  MCV 96.2 96.7 --  PLT 130* 105* --   Urinalysis:  Basename 09/23/12 1003  COLORURINE YELLOW  LABSPEC 1.015  PHURINE 5.5  GLUCOSEU NEGATIVE  HGBUR LARGE*  BILIRUBINUR NEGATIVE  KETONESUR NEGATIVE  PROTEINUR 100*  UROBILINOGEN 1.0  NITRITE NEGATIVE  LEUKOCYTESUR LARGE*    Recent Results (from the past 240 hour(s))  URINE CULTURE     Status: Normal   Collection Time   09/23/12 10:03  AM      Component Value Range Status Comment   Specimen Description URINE, CLEAN CATCH   Final    Special Requests NONE   Final    Culture  Setup Time 09/23/2012 16:20   Final    Colony Count >=100,000 COLONIES/ML   Final    Culture ESCHERICHIA COLI   Final    Report Status 09/25/2012 FINAL   Final    Organism ID, Bacteria ESCHERICHIA COLI   Final   CULTURE, BLOOD (ROUTINE X 2)     Status: Normal (Preliminary result)   Collection Time   09/23/12  5:35 PM      Component Value Range Status Comment   Specimen Description BLOOD RIGHT ARM   Final    Special Requests BOTTLES DRAWN AEROBIC AND ANAEROBIC 10CC   Final    Culture  Setup Time 09/23/2012 22:17   Final    Culture     Final    Value:        BLOOD CULTURE RECEIVED NO GROWTH TO DATE CULTURE WILL BE HELD FOR 5 DAYS BEFORE ISSUING A FINAL NEGATIVE REPORT   Report Status PENDING   Incomplete   CULTURE, BLOOD (ROUTINE X 2)     Status: Normal (Preliminary result)   Collection Time   09/23/12  5:45 PM      Component Value Range  Status Comment   Specimen Description BLOOD RIGHT HAND   Final    Special Requests BOTTLES DRAWN AEROBIC AND ANAEROBIC 10CC   Final    Culture  Setup Time 09/23/2012 22:17   Final    Culture     Final    Value:        BLOOD CULTURE RECEIVED NO GROWTH TO DATE CULTURE WILL BE HELD FOR 5 DAYS BEFORE ISSUING A FINAL NEGATIVE REPORT   Report Status PENDING   Incomplete     Studies/Results: Ct Abdomen Pelvis Wo Contrast  09/23/2012  *RADIOLOGY REPORT*  Clinical Data: Generalized abdominal pain, vomiting, hematuria.  CT ABDOMEN AND PELVIS WITHOUT CONTRAST  Technique:  Multidetector CT imaging of the abdomen and pelvis was performed following the standard protocol without intravenous contrast.  Comparison: 05/27/2006  Findings: Small to moderate hiatal hernia, stable.  Dependent atelectasis in the lung bases.  No effusions.  Heart is mildly enlarged.  Gallstones fill the gallbladder, stable.  Liver, stomach, spleen,  pancreas, adrenals have an unremarkable unenhanced appearance.  Mild left hydronephrosis and perinephric stranding.  The left ureter is decompressed.  No definite visualized ureteral stone. Multiple calcifications in the anatomic pelvis felt represent phleboliths.  Urinary bladder is grossly unremarkable. Areas of scarring in the right kidney without hydronephrosis.  Prior hysterectomy.  Scattered sigmoid diverticula.  No active diverticulitis.  Small bowel is decompressed.  No free fluid, free air or adenopathy.  Aorta and iliac vessels are heavily calcified, non-aneurysmal.  The degenerative changes in the lumbar spine.  No acute bony abnormality.  IMPRESSION: Mild left hydronephrosis and perinephric stranding.  The left ureter is decompressed, and I see no definite ureteral stones. Question recent passage of stone.  Cholelithiasis.  Small to moderate hiatal hernia.  Sigmoid diverticulosis.   Original Report Authenticated By: Charlett Nose, M.D.    US Renal  09/24/2012  *RADIOLOGY REPORT*  Clinical Data: Worsening acute renal failure.  Question hydronephrosis.  RENAL/URINARY TRACT ULTRASOUND COMPLETE  Comparison:  09/23/2012 CT.  06/05/2010 abdominal sonogram.  Findings:  Right Kidney:  11.6 cm.  Mild increased echogenicity. No hydronephrosis or renal mass.  Left Kidney:  10.9 cm.  Parapelvic cysts suspected in addition to mild left renal collecting system fullness.  Bladder:  Post void and decompressed.  IMPRESSION: Left-sided parapelvic cysts suspected in addition to mild left renal collecting system fullness.   Original Report Authenticated By: Lacy Duverney, M.D.    Dg Chest Port 1 View  09/23/2012  *RADIOLOGY REPORT*  Clinical Data: Cough.  Fever.  PORTABLE CHEST - 1 VIEW  Comparison: 06/27/2011  Findings: Tortuous thoracic aorta noted.  Mild cardiomegaly noted with cardiothoracic index 60% on this AP radiograph.  Lungs appear clear.  Thoracic spondylosis noted.  No blunting of the lateral costophrenic  angles.  IMPRESSION:  1.  Tortuous thoracic aorta with atherosclerotic calcification. 2.  Mild cardiomegaly, without edema. 3.  Thoracic spondylosis.   Original Report Authenticated By: Gaylyn Rong, M.D.     Medications: Scheduled Meds:    . atorvastatin  20 mg Oral q1800  . cefTRIAXone (ROCEPHIN)  IV  1 g Intravenous Q24H  . clopidogrel  75 mg Oral Daily  . feeding supplement  1 Container Oral TID BM  . sodium chloride  3 mL Intravenous Q12H   Continuous Infusions:    . sodium chloride 100 mL/hr at 09/25/12 0636   PRN Meds:.acetaminophen, acetaminophen, ondansetron (ZOFRAN) IV, ondansetron, oxyCODONE, senna-docusate  Assessment/Plan:  Principal Problem:  *Pyelonephritis due to Escherichia  coli Active Problems:  HTN (hypertension)  Hematuria  Hydronephrosis, left  ARF (acute renal failure)  CKD (chronic kidney disease) stage 3, GFR 30-59 ml/min   E Coli Pyelonephritis -Pansensitive. -Continue Rocephin. -May transition to cipro at time of DC to complete 7-10 days of treatment.  Acute on CKD Stage III -Marked worsening in Cr overnight despite IVF. -Cr up to 4.09 1/30 from 2.78 1/29 up to 5.17 1/31. Baseline Cr around 1.1-1.5. -There was some left hydronephrosis on CT scan, but no stones. -Will place foley. -STAT renal US shows no worsening hydronephrosis. -Appreciate GU input, but there is no role for surgery at this point. -Have requested that we document UOP. -Will request a renal consultation.  Fever -2/2 pyelo. -Flu PCR negative. -CXR without acute disease.  Disposition -Home pending resolution of ARF.   Time spent coordinating care: 35 minutes.   LOS: 2 days   [facility] Pager: [phone] 09/25/2012, 12:12 PM

## 2012-09-26 DIAGNOSIS — I2510 Atherosclerotic heart disease of native coronary artery without angina pectoris: Secondary | ICD-10-CM

## 2012-09-26 LAB — RENAL FUNCTION PANEL
Albumin: 2.5 g/dL — ABNORMAL LOW (ref 3.5–5.2)
BUN: 65 mg/dL — ABNORMAL HIGH (ref 6–23)
Creatinine, Ser: 6.2 mg/dL — ABNORMAL HIGH (ref 0.50–1.10)
GFR calc non Af Amer: 5 mL/min — ABNORMAL LOW (ref >90)
Phosphorus: 5.8 mg/dL — ABNORMAL HIGH (ref 2.3–4.6)
Potassium: 3.8 mEq/L (ref 3.5–5.1)

## 2012-09-26 LAB — CK: Total CK: 57 U/L (ref 7–177)

## 2012-09-26 MED ORDER — SODIUM CHLORIDE 0.9 % IV SOLN
INTRAVENOUS | Status: DC
  Administered 2012-09-26 – 2012-09-27 (×3): via INTRAVENOUS
  Administered 2012-09-28: 1000 mL via INTRAVENOUS

## 2012-09-26 MED ORDER — SODIUM CHLORIDE 0.9 % IV BOLUS (SEPSIS)
500.0000 mL | Freq: Once | INTRAVENOUS | Status: AC
  Administered 2012-09-26: 500 mL via INTRAVENOUS

## 2012-09-26 MED ORDER — CLONIDINE HCL 0.1 MG PO TABS
0.1000 mg | ORAL_TABLET | Freq: Two times a day (BID) | ORAL | Status: DC
  Administered 2012-09-26 – 2012-09-28 (×6): 0.1 mg via ORAL
  Filled 2012-09-26 (×8): qty 1

## 2012-09-26 NOTE — Progress Notes (Addendum)
[facility]             Progress Note   Subjective: No complaints. UOP 775 last 24 hours.  Objective: Vital signs in last 24 hours: Temp:  [97.3 F (36.3 C)-98.8 F (37.1 C)] 98.7 F (37.1 C) (02/01 0642) Pulse Rate:  [88-98] 90  (02/01 0919) Resp:  [18-20] 20  (02/01 0919) BP: (139-163)/(76-84) 163/84 mmHg (02/01 0919) SpO2:  [92 %-96 %] 94 % (02/01 0919) Weight change:  Last BM Date: 09/25/12  Intake/Output from previous day: 01/31 0701 - 02/01 0700 In: 1908.3 [P.O.:580; I.V.:1228.3; IV Piggyback:100] Out: 775 [Urine:775] Total I/O In: 1623.7 [P.O.:237; I.V.:1386.7] Out: 300 [Urine:300]   Physical Exam: General: Alert, awake, oriented x3, in no acute distress. HEENT: No bruits, no goiter. Heart: Regular rate and rhythm, without murmurs, rubs, gallops. Lungs: Clear to auscultation bilaterally. Abdomen: Soft, nontender, nondistended, positive bowel sounds. Extremities: No clubbing cyanosis or edema with positive pedal pulses. Neuro: Grossly intact, nonfocal.    Lab Results: Basic Metabolic Panel:  Basename 09/26/12 0625 09/25/12 0620  NA 140 138  K 3.8 3.6  CL 108 107  CO2 19 19  GLUCOSE 130* 123*  BUN 65* 58*  CREATININE 6.20* 5.17*  CALCIUM 9.5 9.4  MG -- --  PHOS 5.8* --   Liver Function Tests:  Basename 09/26/12 0625  AST --  ALT --  ALKPHOS --  BILITOT --  PROT --  ALBUMIN 2.5*   CBC:  Basename 09/25/12 0620 09/24/12 0655  WBC 10.2 12.0*  NEUTROABS -- --  HGB 10.1* 9.9*  HCT 30.6* 29.3*  MCV 96.2 96.7  PLT 130* 105*     Recent Results (from the past 240 hour(s))  URINE CULTURE     Status: Normal   Collection Time   09/23/12 10:03 AM      Component Value Range Status Comment   Specimen Description URINE, CLEAN CATCH   Final    Special Requests NONE   Final    Culture  Setup Time 09/23/2012 16:20   Final    Colony Count >=100,000 COLONIES/ML   Final    Culture ESCHERICHIA COLI   Final    Report Status 09/25/2012  FINAL   Final    Organism ID, Bacteria ESCHERICHIA COLI   Final   CULTURE, BLOOD (ROUTINE X 2)     Status: Normal (Preliminary result)   Collection Time   09/23/12  5:35 PM      Component Value Range Status Comment   Specimen Description BLOOD RIGHT ARM   Final    Special Requests BOTTLES DRAWN AEROBIC AND ANAEROBIC 10CC   Final    Culture  Setup Time 09/23/2012 22:17   Final    Culture     Final    Value:        BLOOD CULTURE RECEIVED NO GROWTH TO DATE CULTURE WILL BE HELD FOR 5 DAYS BEFORE ISSUING A FINAL NEGATIVE REPORT   Report Status PENDING   Incomplete   CULTURE, BLOOD (ROUTINE X 2)     Status: Normal (Preliminary result)   Collection Time   09/23/12  5:45 PM      Component Value Range Status Comment   Specimen Description BLOOD RIGHT HAND   Final    Special Requests BOTTLES DRAWN AEROBIC AND ANAEROBIC 10CC   Final    Culture  Setup Time 09/23/2012 22:17   Final    Culture     Final    Value:  BLOOD CULTURE RECEIVED NO GROWTH TO DATE CULTURE WILL BE HELD FOR 5 DAYS BEFORE ISSUING A FINAL NEGATIVE REPORT   Report Status PENDING   Incomplete     Studies/Results: US Renal  09/24/2012  *RADIOLOGY REPORT*  Clinical Data: Worsening acute renal failure.  Question hydronephrosis.  RENAL/URINARY TRACT ULTRASOUND COMPLETE  Comparison:  09/23/2012 CT.  06/05/2010 abdominal sonogram.  Findings:  Right Kidney:  11.6 cm.  Mild increased echogenicity. No hydronephrosis or renal mass.  Left Kidney:  10.9 cm.  Parapelvic cysts suspected in addition to mild left renal collecting system fullness.  Bladder:  Post void and decompressed.  IMPRESSION: Left-sided parapelvic cysts suspected in addition to mild left renal collecting system fullness.   Original Report Authenticated By: Lacy Duverney, M.D.     Medications: Scheduled Meds:    . atorvastatin  20 mg Oral q1800  . cefTRIAXone (ROCEPHIN)  IV  1 g Intravenous Q24H  . clopidogrel  75 mg Oral Daily  . feeding supplement  1 Container Oral  TID BM  . sodium chloride  3 mL Intravenous Q12H   Continuous Infusions:   PRN Meds:.acetaminophen, acetaminophen, ondansetron (ZOFRAN) IV, ondansetron, oxyCODONE, senna-docusate  Assessment/Plan:  Principal Problem:  *Pyelonephritis due to Escherichia coli Active Problems:  HTN (hypertension)  Hematuria  Hydronephrosis, left  ARF (acute renal failure)  CKD (chronic kidney disease) stage 3, GFR 30-59 ml/min   E Coli Pyelonephritis -Pansensitive. -Continue Rocephin. -May transition to cipro at time of DC to complete 7-10 days of treatment.  Acute on CKD Stage III -Cr continues to worsen. -6.20 today from 5.17 yesterday. -Non-oliguric: UOP 775 cc. -Has been receiving NS @ 100 cc/hr since admission. Will discontinue as it does not seem to be having any effect on her renal function. -Still with no acute HD indication. -Will continue to follow and hope that it plateaus soon. -Renal following.  Fever -2/2 pyelo. -Flu PCR negative. -CXR without acute disease.  Disposition -Home pending resolution of ARF.   Time spent coordinating care: 25 minutes.   LOS: 3 days   HERNANDEZ ACOSTA,ESTELA [facility] Pager: [phone] 09/26/2012, 10:54 AM

## 2012-09-26 NOTE — Progress Notes (Signed)
Subjective: reports some nausea, no vomiting, no jerking or confusion. Creat up 6.25, UOP 775 cc yesterday and 300cc today so far  Objective Vital signs in last 24 hours: Filed Vitals:   09/25/12 1417 09/25/12 2112 09/26/12 0642 09/26/12 0919  BP: 153/76 145/81 139/81 163/84  Pulse: 88 92 98 90  Temp: 97.3 F (36.3 C) 98.8 F (37.1 C) 98.7 F (37.1 C)   TempSrc: Oral Oral Oral   Resp: 20 18 20 20   Height:      Weight:      SpO2: 96% 95% 92% 94%   Weight change:   Intake/Output Summary (Last 24 hours) at 09/26/12 1113 Last data filed at 09/26/12 0900  Gross per 24 hour  Intake   2962 ml  Output   1075 ml  Net   1887 ml   Labs: Basic Metabolic Panel:  Lab 09/26/12 4098 09/25/12 0620 09/24/12 0655 09/23/12 0840  NA 140 138 139 140  K 3.8 3.6 3.5 3.6  CL 108 107 103 102  CO2 19 19 22 26   GLUCOSE 130* 123* 157* 176*  BUN 65* 58* 49* 34*  CREATININE 6.20* 5.17* 4.06* 2.78*  ALB -- -- -- --  CALCIUM 9.5 9.4 9.7 10.3  PHOS 5.8* -- -- --   Liver Function Tests:  Lab 09/26/12 0625 09/23/12 0840  AST -- 23  ALT -- 15  ALKPHOS -- 96  BILITOT -- 0.7  PROT -- 6.8  ALBUMIN 2.5* 3.1*    Lab 09/23/12 0840  LIPASE 18  AMYLASE --   No results found for this basename: AMMONIA:3 in the last 168 hours CBC:  Lab 09/25/12 0620 09/24/12 0655 09/23/12 0840  WBC 10.2 12.0* 12.8*  NEUTROABS -- -- 10.6*  HGB 10.1* 9.9* 10.8*  HCT 30.6* 29.3* 32.7*  MCV 96.2 96.7 97.6  PLT 130* 105* 102*   PT/INR: @labrcntip (inr:5) Cardiac Enzymes:  Lab 09/26/12 0625  CKTOTAL 57  CKMB --  CKMBINDEX --  TROPONINI --   CBG: No results found for this basename: GLUCAP:5 in the last 168 hours  Iron Studies: No results found for this basename: IRON:30,TIBC:30,TRANSFERRIN:30,FERRITIN:30 in the last 168 hours  Scheduled Meds    . atorvastatin  20 mg Oral q1800  . cefTRIAXone (ROCEPHIN)  IV  1 g Intravenous Q24H  . clopidogrel  75 mg Oral Daily  . feeding supplement  1 Container Oral  TID BM  . sodium chloride  3 mL Intravenous Q12H    Physical Exam:  Blood pressure 163/84, pulse 90, temperature 98.7 F (37.1 C), temperature source Oral, resp. rate 20, height 5\' 4"  (1.626 m), weight 67 kg (147 lb 11.3 oz), SpO2 94.00%.  Gen- alert elderly AAF no distress Neck- no jvd or bruits Chest- clear bilat, no rales or wheezing Cor- no rub or S3 Abd- soft, nontender, nondistended Ext- no LE edema or effusions Neuro- nonfocal, Ox3, no asterixis  Impression/Recommendations 1.  AKI- due to combination of dehydration/NSAID's/ACEI, UTI and possible recent L stone passage. CPK was normal. Creat still rising, mild uremic symptoms, no signs of volume excess.  Will add IVF"s.  If renal function not improving soon will need dialysis acutely. 2.  UTI EColi 3.  HTN- not getting home meds now (cardedilol, clonidine, lisinopril).  Will restart clonidine since BP starting to rise.  Vinson Moselle  MD [facility] [phone] pgr    [phone] cell 09/26/2012, 11:13 AM

## 2012-09-27 LAB — RENAL FUNCTION PANEL
CO2: 16 mEq/L — ABNORMAL LOW (ref 19–32)
Calcium: 9.3 mg/dL (ref 8.4–10.5)
Creatinine, Ser: 6.72 mg/dL — ABNORMAL HIGH (ref 0.50–1.10)
Glucose, Bld: 107 mg/dL — ABNORMAL HIGH (ref 70–99)
Phosphorus: 6.7 mg/dL — ABNORMAL HIGH (ref 2.3–4.6)

## 2012-09-27 NOTE — Progress Notes (Signed)
Subjective: Feels much better today and UOP sig better on IVF's. No nausea   Objective Vital signs in last 24 hours: Filed Vitals:   09/26/12 1207 09/26/12 1523 09/26/12 2056 09/27/12 0433  BP: 170/91 117/65 165/89 133/77  Pulse:  89 91 86  Temp:  97.9 F (36.6 C) 98.2 F (36.8 C) 97.3 F (36.3 C)  TempSrc:  Oral Oral Oral  Resp:  20 18 18   Height:      Weight:    75.3 kg (166 lb 0.1 oz)  SpO2:  96% 96% 95%   Weight change:   Intake/Output Summary (Last 24 hours) at 09/27/12 1413 Last data filed at 09/27/12 0856  Gross per 24 hour  Intake   1464 ml  Output   1750 ml  Net   -286 ml   Labs: Basic Metabolic Panel:  Lab 09/27/12 1610 09/26/12 0625 09/25/12 0620 09/24/12 0655 09/23/12 0840  NA 140 140 138 139 140  K 3.9 3.8 3.6 3.5 3.6  CL 109 108 107 103 102  CO2 16* 19 19 22 26   GLUCOSE 107* 130* 123* 157* 176*  BUN 69* 65* 58* 49* 34*  CREATININE 6.72* 6.20* 5.17* 4.06* 2.78*  ALB -- -- -- -- --  CALCIUM 9.3 9.5 9.4 9.7 10.3  PHOS 6.7* 5.8* -- -- --   Liver Function Tests:  Lab 09/27/12 0654 09/26/12 0625 09/23/12 0840  AST -- -- 23  ALT -- -- 15  ALKPHOS -- -- 96  BILITOT -- -- 0.7  PROT -- -- 6.8  ALBUMIN 2.3* 2.5* 3.1*    Lab 09/23/12 0840  LIPASE 18  AMYLASE --   No results found for this basename: AMMONIA:3 in the last 168 hours CBC:  Lab 09/25/12 0620 09/24/12 0655 09/23/12 0840  WBC 10.2 12.0* 12.8*  NEUTROABS -- -- 10.6*  HGB 10.1* 9.9* 10.8*  HCT 30.6* 29.3* 32.7*  MCV 96.2 96.7 97.6  PLT 130* 105* 102*   PT/INR: @labrcntip (inr:5) Cardiac Enzymes:  Lab 09/26/12 0625  CKTOTAL 57  CKMB --  CKMBINDEX --  TROPONINI --   CBG: No results found for this basename: GLUCAP:5 in the last 168 hours  Iron Studies: No results found for this basename: IRON:30,TIBC:30,TRANSFERRIN:30,FERRITIN:30 in the last 168 hours  Scheduled Meds    . atorvastatin  20 mg Oral q1800  . cefTRIAXone (ROCEPHIN)  IV  1 g Intravenous Q24H  . cloNIDine  0.1 mg  Oral BID  . clopidogrel  75 mg Oral Daily  . feeding supplement  1 Container Oral TID BM  . sodium chloride  3 mL Intravenous Q12H    Physical Exam:  Blood pressure 133/77, pulse 86, temperature 97.3 F (36.3 C), temperature source Oral, resp. rate 18, height 5\' 4"  (1.626 m), weight 75.3 kg (166 lb 0.1 oz), SpO2 95.00%.  Gen- alert elderly AAF no distress Neck- no jvd or bruits Chest- clear bilat, no rales or wheezing Cor- no rub or S3 Abd- soft, nontender, nondistended Ext- no LE edema or effusions Neuro- nonfocal, Ox3, no asterixis  Impression/Recommendations 1.  AKI- due to combination of dehydration/NSAID's/ACEI, UTI and possible recent L stone passage- looks and feels better with IVF"s, prob was dehydrated.  Will cont fluids for now, UOP better.  No need for HD. Check labs qam. 2.  UTI EColi 3.  HTN- not getting home meds now (cardedilol, clonidine, lisinopril).  Restarted clonidine since BP starting to rise.  Vinson Moselle  MD [employer] [phone] pgr  [phone] cell 09/27/2012, 2:13 PM

## 2012-09-27 NOTE — Progress Notes (Signed)
[facility]             Progress Note   Subjective: No complaints. UOP 850 last 24 hours, plus 400 so far today.  Objective: Vital signs in last 24 hours: Temp:  [97.3 F (36.3 C)-98.2 F (36.8 C)] 97.3 F (36.3 C) (02/02 0433) Pulse Rate:  [86-91] 86  (02/02 0433) Resp:  [18-20] 18  (02/02 0433) BP: (117-170)/(65-91) 133/77 mmHg (02/02 0433) SpO2:  [95 %-96 %] 95 % (02/02 0433) Weight:  [74.1 kg (163 lb 5.8 oz)-75.3 kg (166 lb 0.1 oz)] 75.3 kg (166 lb 0.1 oz) (02/02 0433) Weight change:  Last BM Date: 09/26/12  Intake/Output from previous day: 02/01 0701 - 02/02 0700 In: 3150.7 [P.O.:1314; I.V.:1786.7; IV Piggyback:50] Out: 1650 [Urine:1650] Total I/O In: 347 [P.O.:222; I.V.:125] Out: 400 [Urine:400]   Physical Exam: General: Alert, awake, oriented x3, in no acute distress. HEENT: No bruits, no goiter. Heart: Regular rate and rhythm, without murmurs, rubs, gallops. Lungs: Clear to auscultation bilaterally. Abdomen: Soft, nontender, nondistended, positive bowel sounds. Extremities: No clubbing cyanosis or edema with positive pedal pulses. Neuro: Grossly intact, nonfocal.    Lab Results: Basic Metabolic Panel:  Basename 09/27/12 0654 09/26/12 0625  NA 140 140  K 3.9 3.8  CL 109 108  CO2 16* 19  GLUCOSE 107* 130*  BUN 69* 65*  CREATININE 6.72* 6.20*  CALCIUM 9.3 9.5  MG -- --  PHOS 6.7* 5.8*   Liver Function Tests:  Basename 09/27/12 0654 09/26/12 0625  AST -- --  ALT -- --  ALKPHOS -- --  BILITOT -- --  PROT -- --  ALBUMIN 2.3* 2.5*   CBC:  Basename 09/25/12 0620  WBC 10.2  NEUTROABS --  HGB 10.1*  HCT 30.6*  MCV 96.2  PLT 130*     Recent Results (from the past 240 hour(s))  URINE CULTURE     Status: Normal   Collection Time   09/23/12 10:03 AM      Component Value Range Status Comment   Specimen Description URINE, CLEAN CATCH   Final    Special Requests NONE   Final    Culture  Setup Time 09/23/2012 16:20   Final     Colony Count >=100,000 COLONIES/ML   Final    Culture ESCHERICHIA COLI   Final    Report Status 09/25/2012 FINAL   Final    Organism ID, Bacteria ESCHERICHIA COLI   Final   CULTURE, BLOOD (ROUTINE X 2)     Status: Normal (Preliminary result)   Collection Time   09/23/12  5:35 PM      Component Value Range Status Comment   Specimen Description BLOOD RIGHT ARM   Final    Special Requests BOTTLES DRAWN AEROBIC AND ANAEROBIC 10CC   Final    Culture  Setup Time 09/23/2012 22:17   Final    Culture     Final    Value:        BLOOD CULTURE RECEIVED NO GROWTH TO DATE CULTURE WILL BE HELD FOR 5 DAYS BEFORE ISSUING A FINAL NEGATIVE REPORT   Report Status PENDING   Incomplete   CULTURE, BLOOD (ROUTINE X 2)     Status: Normal (Preliminary result)   Collection Time   09/23/12  5:45 PM      Component Value Range Status Comment   Specimen Description BLOOD RIGHT HAND   Final    Special Requests BOTTLES DRAWN AEROBIC AND ANAEROBIC 10CC   Final    Culture  Setup Time 09/23/2012 22:17   Final    Culture     Final    Value:        BLOOD CULTURE RECEIVED NO GROWTH TO DATE CULTURE WILL BE HELD FOR 5 DAYS BEFORE ISSUING A FINAL NEGATIVE REPORT   Report Status PENDING   Incomplete     Studies/Results: No results found.  Medications: Scheduled Meds:    . atorvastatin  20 mg Oral q1800  . cefTRIAXone (ROCEPHIN)  IV  1 g Intravenous Q24H  . cloNIDine  0.1 mg Oral BID  . clopidogrel  75 mg Oral Daily  . feeding supplement  1 Container Oral TID BM  . sodium chloride  3 mL Intravenous Q12H   Continuous Infusions:    . sodium chloride 100 mL/hr at 09/27/12 1009   PRN Meds:.acetaminophen, acetaminophen, ondansetron (ZOFRAN) IV, ondansetron, oxyCODONE, senna-docusate  Assessment/Plan:  Principal Problem:  *Pyelonephritis due to Escherichia coli Active Problems:  HTN (hypertension)  Hematuria  Hydronephrosis, left  ARF (acute renal failure)  CKD (chronic kidney disease) stage 3, GFR 30-59  ml/min   E Coli Pyelonephritis -Pansensitive. -Continue Rocephin. -May transition to cipro at time of DC to complete 7-10 days of treatment.  Acute on CKD Stage III -Cr continues to worsen. -6.72 today from 6.20 yesterday. -Non-oliguric: UOP 850 cc. -Still with no acute HD indication. -Will continue to follow and hope that it plateaus soon. (?may be is already plateau'ing....less steeper incline today) -Renal following.  Fever -2/2 pyelo. -Flu PCR negative. -CXR without acute disease.  Disposition -Home pending resolution of ARF.   Time spent coordinating care: 25 minutes.   LOS: 4 days   [facility] Pager: [phone] 09/27/2012, 11:56 AM

## 2012-09-28 LAB — RENAL FUNCTION PANEL
BUN: 71 mg/dL — ABNORMAL HIGH (ref 6–23)
CO2: 15 mEq/L — ABNORMAL LOW (ref 19–32)
Chloride: 113 mEq/L — ABNORMAL HIGH (ref 96–112)
Creatinine, Ser: 6.56 mg/dL — ABNORMAL HIGH (ref 0.50–1.10)

## 2012-09-28 MED ORDER — STERILE WATER FOR INJECTION IV SOLN: 3.0000 | INTRAVENOUS | Status: DC

## 2012-09-28 MED ORDER — STERILE WATER FOR INJECTION IV SOLN
INTRAVENOUS | Status: DC
  Administered 2012-09-28 – 2012-09-29 (×3): via INTRAVENOUS
  Filled 2012-09-28 (×8): qty 850

## 2012-09-28 MED ORDER — HYDRALAZINE HCL 20 MG/ML IJ SOLN
5.0000 mg | Freq: Once | INTRAMUSCULAR | Status: AC
  Administered 2012-09-28: 5 mg via INTRAVENOUS
  Filled 2012-09-28: qty 0.25

## 2012-09-28 NOTE — Progress Notes (Signed)
[facility]             Progress Note   Subjective: No complaints. UOP 400 cc yesterday.  Objective: Vital signs in last 24 hours: Temp:  [98.1 F (36.7 C)-98.7 F (37.1 C)] 98.7 F (37.1 C) (02/03 0439) Pulse Rate:  [83-97] 97  (02/03 0439) Resp:  [18] 18  (02/03 0439) BP: (122-173)/(71-86) 156/81 mmHg (02/03 0935) SpO2:  [91 %-97 %] 91 % (02/03 0439) Weight:  [75.2 kg (165 lb 12.6 oz)] 75.2 kg (165 lb 12.6 oz) (02/03 0439) Weight change: 1.1 kg (2 lb 6.8 oz) Last BM Date: 09/28/12  Intake/Output from previous day: 02/02 0701 - 02/03 0700 In: 3652.3 [P.O.:952; I.V.:2068.3; IV Piggyback:50] Out: 400 [Urine:400] Total I/O In: 240 [P.O.:240] Out: 100 [Urine:100]   Physical Exam: General: Alert, awake, oriented x3, in no acute distress. HEENT: No bruits, no goiter. Heart: Regular rate and rhythm, without murmurs, rubs, gallops. Lungs: Clear to auscultation bilaterally. Abdomen: Soft, nontender, nondistended, positive bowel sounds. Extremities: No clubbing cyanosis or edema with positive pedal pulses. Neuro: Grossly intact, nonfocal.    Lab Results: Basic Metabolic Panel:  Basename 09/28/12 0545 09/27/12 0654  NA 142 140  K 3.7 3.9  CL 113* 109  CO2 15* 16*  GLUCOSE 111* 107*  BUN 71* 69*  CREATININE 6.56* 6.72*  CALCIUM 9.2 9.3  MG -- --  PHOS 6.5* 6.7*   Liver Function Tests:  Basename 09/28/12 0545 09/27/12 0654  AST -- --  ALT -- --  ALKPHOS -- --  BILITOT -- --  PROT -- --  ALBUMIN 2.2* 2.3*   CBC: No results found for this basename: WBC:2,NEUTROABS:2,HGB:2,HCT:2,MCV:2,PLT:2 in the last 72 hours   Recent Results (from the past 240 hour(s))  URINE CULTURE     Status: Normal   Collection Time   09/23/12 10:03 AM      Component Value Range Status Comment   Specimen Description URINE, CLEAN CATCH   Final    Special Requests NONE   Final    Culture  Setup Time 09/23/2012 16:20   Final    Colony Count >=100,000 COLONIES/ML   Final     Culture ESCHERICHIA COLI   Final    Report Status 09/25/2012 FINAL   Final    Organism ID, Bacteria ESCHERICHIA COLI   Final   CULTURE, BLOOD (ROUTINE X 2)     Status: Normal (Preliminary result)   Collection Time   09/23/12  5:35 PM      Component Value Range Status Comment   Specimen Description BLOOD RIGHT ARM   Final    Special Requests BOTTLES DRAWN AEROBIC AND ANAEROBIC 10CC   Final    Culture  Setup Time 09/23/2012 22:17   Final    Culture     Final    Value:        BLOOD CULTURE RECEIVED NO GROWTH TO DATE CULTURE WILL BE HELD FOR 5 DAYS BEFORE ISSUING A FINAL NEGATIVE REPORT   Report Status PENDING   Incomplete   CULTURE, BLOOD (ROUTINE X 2)     Status: Normal (Preliminary result)   Collection Time   09/23/12  5:45 PM      Component Value Range Status Comment   Specimen Description BLOOD RIGHT HAND   Final    Special Requests BOTTLES DRAWN AEROBIC AND ANAEROBIC 10CC   Final    Culture  Setup Time 09/23/2012 22:17   Final    Culture     Final  Value:        BLOOD CULTURE RECEIVED NO GROWTH TO DATE CULTURE WILL BE HELD FOR 5 DAYS BEFORE ISSUING A FINAL NEGATIVE REPORT   Report Status PENDING   Incomplete     Studies/Results: No results found.  Medications: Scheduled Meds:    . atorvastatin  20 mg Oral q1800  . cefTRIAXone (ROCEPHIN)  IV  1 g Intravenous Q24H  . cloNIDine  0.1 mg Oral BID  . clopidogrel  75 mg Oral Daily  . feeding supplement  1 Container Oral TID BM  . sodium chloride  3 mL Intravenous Q12H   Continuous Infusions:    . sodium chloride 1,000 mL (09/28/12 0943)   PRN Meds:.acetaminophen, acetaminophen, ondansetron (ZOFRAN) IV, ondansetron, oxyCODONE, senna-docusate  Assessment/Plan:  Principal Problem:  *Pyelonephritis due to Escherichia coli Active Problems:  HTN (hypertension)  Hematuria  Hydronephrosis, left  ARF (acute renal failure)  CKD (chronic kidney disease) stage 3, GFR 30-59 ml/min   E Coli  Pyelonephritis -Pansensitive. -Continue Rocephin. -May transition to cipro at time of DC to complete 7-10 days of treatment.  Acute on CKD Stage III -Cr with slight improvement: 6.72-->6.56 -No acute HD indication. -Will continue to follow. -Renal following.  Fever -2/2 pyelo. -Flu PCR negative. -CXR without acute disease.  Disposition -Home pending resolution of ARF.   Time spent coordinating care: 25 minutes.   LOS: 5 days   [facility] Pager: [phone] 09/28/2012, 1:09 PM

## 2012-09-28 NOTE — Progress Notes (Signed)
Pt BP 207/93. On call MD notified, hydralazine 5mg   Given.

## 2012-09-28 NOTE — Progress Notes (Signed)
VSS - Blood pressure 185/85, pulse 86, temperature 97.9 F (36.6 C), temperature source Oral, resp. rate 18, height 5\' 4"  (1.626 m), weight 75.2 kg (165 lb 12.6 oz), SpO2 97.00%., R/A.  Dr. Clearence Ped texted paged.  Will await for return call.  Forbes Cellar, RN

## 2012-09-28 NOTE — Progress Notes (Addendum)
Subjective:  No complaints UOP I don't think all recorded but 700 cc in the foley Getting IVF at 100/hour Objective Vital signs in last 24 hours: Filed Vitals:   09/27/12 1456 09/27/12 2146 09/28/12 0439 09/28/12 0935  BP: 122/71 154/86 173/80 156/81  Pulse: 83 85 97   Temp: 98.7 F (37.1 C) 98.1 F (36.7 C) 98.7 F (37.1 C)   TempSrc: Oral Axillary Oral   Resp: 18 18 18    Height:      Weight:   75.2 kg (165 lb 12.6 oz)   SpO2: 97% 96% 91%    Weight change: 1.1 kg (2 lb 6.8 oz)  Intake/Output Summary (Last 24 hours) at 09/28/12 1347 Last data filed at 09/28/12 1035  Gross per 24 hour  Intake 3315.33 ml  Output    100 ml  Net 3215.33 ml  700 cc in the foley Physical Exam:   BP 156/81  Pulse 97  Temp 98.7 F (37.1 C) (Oral)  Resp 18  Ht 5\' 4"  (1.626 m)  Wt 75.2 kg (165 lb 12.6 oz)  BMI 28.46 kg/m2  SpO2 91%Gen- alert elderly AAF no distress Perky Talking on her cell phone Neck- no jvd or bruits Chest- clear bilat, no rales or wheezing Cor- no rub or S3 Abd- soft, nontender, nondistended Ext- no LE edema or effusions Neuro  Ox3, no asterixis  Labs: Basic Metabolic Panel:  Lab 09/28/12 4098 09/27/12 0654 09/26/12 0625 09/25/12 0620 09/24/12 0655 09/23/12 0840  NA 142 140 140 138 139 140  K 3.7 3.9 3.8 3.6 3.5 3.6  CL 113* 109 108 107 103 102  CO2 15* 16* 19 19 22 26   GLUCOSE 111* 107* 130* 123* 157* 176*  BUN 71* 69* 65* 58* 49* 34*  CREATININE 6.56* 6.72* 6.20* 5.17* 4.06* 2.78*  ALB -- -- -- -- -- --  CALCIUM 9.2 9.3 9.5 9.4 9.7 10.3  PHOS 6.5* 6.7* 5.8* -- -- --   Liver Function Tests:  Lab 09/28/12 0545 09/27/12 0654 09/26/12 0625 09/23/12 0840  AST -- -- -- 23  ALT -- -- -- 15  ALKPHOS -- -- -- 96  BILITOT -- -- -- 0.7  PROT -- -- -- 6.8  ALBUMIN 2.2* 2.3* 2.5* --    Lab 09/23/12 0840  LIPASE 18  AMYLASE --   No results found for this basename: AMMONIA:3 in the last 168 hours CBC:  Lab 09/25/12 0620 09/24/12 0655 09/23/12 0840  WBC  10.2 12.0* 12.8*  NEUTROABS -- -- 10.6*  HGB 10.1* 9.9* 10.8*  HCT 30.6* 29.3* 32.7*  MCV 96.2 96.7 97.6  PLT 130* 105* 102*    Scheduled Meds    . atorvastatin  20 mg Oral q1800  . cefTRIAXone (ROCEPHIN)  IV  1 g Intravenous Q24H  . cloNIDine  0.1 mg Oral BID  . clopidogrel  75 mg Oral Daily  . feeding supplement  1 Container Oral TID BM  . sodium chloride  3 mL Intravenous Q12H      . sodium chloride 1,000 mL (09/28/12 0943)   Impression/Recommendations  1.  AKI Due to combination of dehydration/NSAID's/ACEI, UTI and possible recent L stone passage Clinically improving Creatinine ? Plateau - a bit better today Looks and feels better with IVF"s, prob was volume depleted to some degree   Will cont fluids for now, UOP better.  With dropping bicarb, change to isotonic bicarb at same rate No HD indications  2.  UTI EColi 3.  HTN- per primary service  Sadie Haber, MD

## 2012-09-29 ENCOUNTER — Encounter: Payer: Self-pay | Admitting: Neurosurgery

## 2012-09-29 LAB — RENAL FUNCTION PANEL
BUN: 67 mg/dL — ABNORMAL HIGH (ref 6–23)
CO2: 19 mEq/L (ref 19–32)
Chloride: 112 mEq/L (ref 96–112)
Glucose, Bld: 123 mg/dL — ABNORMAL HIGH (ref 70–99)
Potassium: 3.6 mEq/L (ref 3.5–5.1)

## 2012-09-29 LAB — CULTURE, BLOOD (ROUTINE X 2)
Culture: NO GROWTH
Culture: NO GROWTH

## 2012-09-29 MED ORDER — CLONIDINE HCL 0.2 MG PO TABS
0.2000 mg | ORAL_TABLET | Freq: Two times a day (BID) | ORAL | Status: DC
  Administered 2012-09-29 – 2012-09-30 (×4): 0.2 mg via ORAL
  Filled 2012-09-29 (×6): qty 1
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
## 2012-09-29 NOTE — Progress Notes (Signed)
  Subjective: Patient reports No pain or discomfort. Feels better  Objective: Vital signs in last 24 hours: Temp:  [97.9 F (36.6 C)-98.5 F (36.9 C)] 98.5 F (36.9 C) (02/04 0505) Pulse Rate:  [86-100] 90  (02/04 0505) Resp:  [18-19] 19  (02/04 0505) BP: (155-207)/(80-93) 155/83 mmHg (02/04 0505) SpO2:  [97 %] 97 % (02/04 0505) Weight:  [171 lb 4.8 oz (77.7 kg)] 171 lb 4.8 oz (77.7 kg) (02/04 0505)  Intake/Output from previous day: 02/03 0701 - 02/04 0700 In: 1663.7 [P.O.:480; I.V.:946.7] Out: 4175 [Urine:4175] Intake/Output this shift:    Physical Exam:  General: Alert and oriented Tolerates diet well. Lungs - Normal respiratory effort, chest expands symmetrically.  Abdomen - Soft, non-tender & non-distended Foley draining well.  Urine clear.  Urinary output: 4175 ml yesterday. Creatinine trending down. 5.88 today; was 6.56 yesterday Lab Results: No results found for this basename: HGB:3,HCT:3 in the last 72 hours BMET  Basename 09/29/12 0700 09/28/12 0545  NA 145 142  K 3.6 3.7  CL 112 113*  CO2 19 15*  GLUCOSE 123* 111*  BUN 67* 71*  CREATININE 5.88* 6.56*  CALCIUM 9.1 9.2   No results found for this basename: LABPT:3,INR:3 in the last 72 hours No results found for this basename: LABURIN:1 in the last 72 hours Results for orders placed during the hospital encounter of 09/23/12  URINE CULTURE     Status: Normal   Collection Time   09/23/12 10:03 AM      Component Value Range Status Comment   Specimen Description URINE, CLEAN CATCH   Final    Special Requests NONE   Final    Culture  Setup Time 09/23/2012 16:20   Final    Colony Count >=100,000 COLONIES/ML   Final    Culture ESCHERICHIA COLI   Final    Report Status 09/25/2012 FINAL   Final    Organism ID, Bacteria ESCHERICHIA COLI   Final   CULTURE, BLOOD (ROUTINE X 2)     Status: Normal   Collection Time   09/23/12  5:35 PM      Component Value Range Status Comment   Specimen Description BLOOD RIGHT  ARM   Final    Special Requests BOTTLES DRAWN AEROBIC AND ANAEROBIC 10CC   Final    Culture  Setup Time 09/23/2012 22:17   Final    Culture NO GROWTH 5 DAYS   Final    Report Status 09/29/2012 FINAL   Final   CULTURE, BLOOD (ROUTINE X 2)     Status: Normal   Collection Time   09/23/12  5:45 PM      Component Value Range Status Comment   Specimen Description BLOOD RIGHT HAND   Final    Special Requests BOTTLES DRAWN AEROBIC AND ANAEROBIC 10CC   Final    Culture  Setup Time 09/23/2012 22:17   Final    Culture NO GROWTH 5 DAYS   Final    Report Status 09/29/2012 FINAL   Final     Studies/Results: No results found.  Assessment/Plan:  Renal insufficiency.  Mild right hydronephrosis.  Will see again at your request   LOS: 6 days   Nareh Matzke-HENRY 09/29/2012, 8:38 AM

## 2012-09-29 NOTE — Progress Notes (Signed)
[facility]             Progress Note   Subjective: No complaints. UOP 4175 cc yesterday.  Objective: Vital signs in last 24 hours: Temp:  [97.9 F (36.6 C)-98.5 F (36.9 C)] 98.5 F (36.9 C) (02/04 0505) Pulse Rate:  [84-100] 84  (02/04 0826) Resp:  [18-19] 19  (02/04 0505) BP: (155-207)/(80-93) 172/80 mmHg (02/04 0826) SpO2:  [97 %-99 %] 99 % (02/04 0826) Weight:  [77.7 kg (171 lb 4.8 oz)] 77.7 kg (171 lb 4.8 oz) (02/04 0505) Weight change: 2.5 kg (5 lb 8.2 oz) Last BM Date: 09/28/12  Intake/Output from previous day: 02/03 0701 - 02/04 0700 In: 1663.7 [P.O.:480; I.V.:946.7] Out: 4175 [Urine:4175] Total I/O In: 220 [P.O.:220] Out: 1100 [Urine:1100]   Physical Exam: General: Alert, awake, oriented x3, in no acute distress. HEENT: No bruits, no goiter. Heart: Regular rate and rhythm, without murmurs, rubs, gallops. Lungs: Clear to auscultation bilaterally. Abdomen: Soft, nontender, nondistended, positive bowel sounds. Extremities: No clubbing cyanosis or edema with positive pedal pulses. Neuro: Grossly intact, nonfocal.    Lab Results: Basic Metabolic Panel:  Basename 09/29/12 0700 09/28/12 0545  NA 145 142  K 3.6 3.7  CL 112 113*  CO2 19 15*  GLUCOSE 123* 111*  BUN 67* 71*  CREATININE 5.88* 6.56*  CALCIUM 9.1 9.2  MG -- --  PHOS 5.9* 6.5*   Liver Function Tests:  Basename 09/29/12 0700 09/28/12 0545  AST -- --  ALT -- --  ALKPHOS -- --  BILITOT -- --  PROT -- --  ALBUMIN 2.2* 2.2*     Recent Results (from the past 240 hour(s))  URINE CULTURE     Status: Normal   Collection Time   09/23/12 10:03 AM      Component Value Range Status Comment   Specimen Description URINE, CLEAN CATCH   Final    Special Requests NONE   Final    Culture  Setup Time 09/23/2012 16:20   Final    Colony Count >=100,000 COLONIES/ML   Final    Culture ESCHERICHIA COLI   Final    Report Status 09/25/2012 FINAL   Final    Organism ID, Bacteria ESCHERICHIA  COLI   Final   CULTURE, BLOOD (ROUTINE X 2)     Status: Normal   Collection Time   09/23/12  5:35 PM      Component Value Range Status Comment   Specimen Description BLOOD RIGHT ARM   Final    Special Requests BOTTLES DRAWN AEROBIC AND ANAEROBIC 10CC   Final    Culture  Setup Time 09/23/2012 22:17   Final    Culture NO GROWTH 5 DAYS   Final    Report Status 09/29/2012 FINAL   Final   CULTURE, BLOOD (ROUTINE X 2)     Status: Normal   Collection Time   09/23/12  5:45 PM      Component Value Range Status Comment   Specimen Description BLOOD RIGHT HAND   Final    Special Requests BOTTLES DRAWN AEROBIC AND ANAEROBIC 10CC   Final    Culture  Setup Time 09/23/2012 22:17   Final    Culture NO GROWTH 5 DAYS   Final    Report Status 09/29/2012 FINAL   Final     Studies/Results: No results found.  Medications: Scheduled Meds:    . atorvastatin  20 mg Oral q1800  . cefTRIAXone (ROCEPHIN)  IV  1 g Intravenous Q24H  . cloNIDine  0.2 mg Oral BID  . clopidogrel  75 mg Oral Daily  . feeding supplement  1 Container Oral TID BM  . sodium chloride  3 mL Intravenous Q12H   Continuous Infusions:    .  sodium bicarbonate 150 mEq in sterile water 1000 mL infusion 100 mL/hr at 09/28/12 2032   PRN Meds:.acetaminophen, acetaminophen, ondansetron (ZOFRAN) IV, ondansetron, oxyCODONE, senna-docusate  Assessment/Plan:  Principal Problem:  *Pyelonephritis due to Escherichia coli Active Problems:  HTN (hypertension)  Hematuria  Hydronephrosis, left  ARF (acute renal failure)  CKD (chronic kidney disease) stage 3, GFR 30-59 ml/min   E Coli Pyelonephritis -Pansensitive. -Continue Rocephin. -May transition to cipro at time of DC to complete 7-10 days of treatment.  Acute on CKD Stage III -Cr has started to improve. -Had good UOP yesterday >4L). -No acute HD indication. -Will continue to follow. -Renal following.  Fever -2/2 pyelo. -Flu PCR negative. -CXR without acute  disease.  Disposition -Home pending resolution of ARF.   Time spent coordinating care: 25 minutes.   LOS: 6 days   [facility] Pager: [phone] 09/29/2012, 1:58 PM

## 2012-09-30 ENCOUNTER — Other Ambulatory Visit: Payer: Medicare Other

## 2012-09-30 ENCOUNTER — Ambulatory Visit: Payer: Medicare Other | Admitting: Neurosurgery

## 2012-09-30 LAB — RENAL FUNCTION PANEL
CO2: 25 mEq/L (ref 19–32)
Chloride: 107 mEq/L (ref 96–112)
GFR calc Af Amer: 8 mL/min — ABNORMAL LOW (ref >90)
Glucose, Bld: 135 mg/dL — ABNORMAL HIGH (ref 70–99)
Phosphorus: 5.3 mg/dL — ABNORMAL HIGH (ref 2.3–4.6)
Potassium: 3 mEq/L — ABNORMAL LOW (ref 3.5–5.1)
Sodium: 147 mEq/L — ABNORMAL HIGH (ref 135–145)

## 2012-09-30 MED ORDER — POTASSIUM CHLORIDE CRYS ER 20 MEQ PO TBCR
30.0000 meq | EXTENDED_RELEASE_TABLET | Freq: Two times a day (BID) | ORAL | Status: DC
  Administered 2012-09-30: 30 meq via ORAL
  Filled 2012-09-30 (×2): qty 1

## 2012-09-30 MED ORDER — CIPROFLOXACIN HCL 500 MG PO TABS
500.0000 mg | ORAL_TABLET | ORAL | Status: DC
  Administered 2012-09-30 – 2012-10-01 (×2): 500 mg via ORAL
  Filled 2012-09-30 (×3): qty 1

## 2012-09-30 MED ORDER — POTASSIUM CHLORIDE CRYS ER 20 MEQ PO TBCR
30.0000 meq | EXTENDED_RELEASE_TABLET | Freq: Two times a day (BID) | ORAL | Status: AC
  Administered 2012-09-30 – 2012-10-02 (×4): 30 meq via ORAL
  Filled 2012-09-30 (×4): qty 1

## 2012-09-30 MED ORDER — PANTOPRAZOLE SODIUM 40 MG PO TBEC
40.0000 mg | DELAYED_RELEASE_TABLET | Freq: Every day | ORAL | Status: DC
  Administered 2012-09-30 – 2012-10-02 (×3): 40 mg via ORAL
  Filled 2012-09-30 (×3): qty 1

## 2012-09-30 MED ORDER — CARVEDILOL 25 MG PO TABS
25.0000 mg | ORAL_TABLET | Freq: Two times a day (BID) | ORAL | Status: DC
  Administered 2012-09-30 – 2012-10-02 (×5): 25 mg via ORAL
  Filled 2012-09-30 (×7): qty 1

## 2012-09-30 MED ORDER — HYDRALAZINE HCL 20 MG/ML IJ SOLN
10.0000 mg | Freq: Once | INTRAMUSCULAR | Status: AC
  Administered 2012-09-30: 10 mg via INTRAVENOUS
  Filled 2012-09-30: qty 0.5

## 2012-09-30 NOTE — Progress Notes (Signed)
[facility]             Progress Note   Subjective: Complains of chest / epigastric tightness that is keeping her from eating much.  Objective: Vital signs in last 24 hours: Temp:  [98 F (36.7 C)-99.1 F (37.3 C)] 98 F (36.7 C) (02/05 1400) Pulse Rate:  [75-84] 77  (02/05 1400) Resp:  [18-20] 18  (02/05 1400) BP: (131-199)/(64-93) 131/66 mmHg (02/05 1400) SpO2:  [93 %-99 %] 99 % (02/05 1400) Weight:  [75.2 kg (165 lb 12.6 oz)] 75.2 kg (165 lb 12.6 oz) (02/05 0601) Weight change: -2.5 kg (-5 lb 8.2 oz) Last BM Date: 09/29/12  Intake/Output from previous day: 02/04 0701 - 02/05 0700 In: 2377.5 [P.O.:700; I.V.:1677.5] Out: 4101 [Urine:4100; Stool:1] Total I/O In: 1314 [P.O.:840; Other:474] Out: 925 [Urine:925]   Physical Exam: General: Alert, awake, sitting in chair, in no acute distress. Heart: Regular rate and rhythm, without murmurs, rubs, gallops. No lower extremity edema noted. Lungs: Clear to auscultation bilaterally. No accessory muscle use. Abdomen: Soft, nontender,  Slightly distended, positive bowel sounds, no obvious masses Extremities: No clubbing cyanosis or edema with positive pedal pulses. Neuro: Grossly intact, nonfocal.  Lab Results: Basic Metabolic Panel:  Basename 09/30/12 0500 09/29/12 0700  NA 147* 145  K 3.0* 3.6  CL 107 112  CO2 25 19  GLUCOSE 135* 123*  BUN 61* 67*  CREATININE 5.29* 5.88*  CALCIUM 9.3 9.1  MG -- --  PHOS 5.3* 5.9*   Liver Function Tests:  Basename 09/30/12 0500 09/29/12 0700  AST -- --  ALT -- --  ALKPHOS -- --  BILITOT -- --  PROT -- --  ALBUMIN 2.5* 2.2*     Recent Results (from the past 240 hour(s))  URINE CULTURE     Status: Normal   Collection Time   09/23/12 10:03 AM      Component Value Range Status Comment   Specimen Description URINE, CLEAN CATCH   Final    Special Requests NONE   Final    Culture  Setup Time 09/23/2012 16:20   Final    Colony Count >=100,000 COLONIES/ML   Final    Culture ESCHERICHIA COLI   Final    Report Status 09/25/2012 FINAL   Final    Organism ID, Bacteria ESCHERICHIA COLI   Final   CULTURE, BLOOD (ROUTINE X 2)     Status: Normal   Collection Time   09/23/12  5:35 PM      Component Value Range Status Comment   Specimen Description BLOOD RIGHT ARM   Final    Special Requests BOTTLES DRAWN AEROBIC AND ANAEROBIC 10CC   Final    Culture  Setup Time 09/23/2012 22:17   Final    Culture NO GROWTH 5 DAYS   Final    Report Status 09/29/2012 FINAL   Final   CULTURE, BLOOD (ROUTINE X 2)     Status: Normal   Collection Time   09/23/12  5:45 PM      Component Value Range Status Comment   Specimen Description BLOOD RIGHT HAND   Final    Special Requests BOTTLES DRAWN AEROBIC AND ANAEROBIC 10CC   Final    Culture  Setup Time 09/23/2012 22:17   Final    Culture NO GROWTH 5 DAYS   Final    Report Status 09/29/2012 FINAL   Final     Studies/Results: No results found.  Medications: Scheduled Meds:    . atorvastatin  20 mg  Oral q1800  . carvedilol  25 mg Oral BID WC  . cefTRIAXone (ROCEPHIN)  IV  1 g Intravenous Q24H  . cloNIDine  0.2 mg Oral BID  . clopidogrel  75 mg Oral Daily  . feeding supplement  1 Container Oral TID BM  . potassium chloride  30 mEq Oral BID  . sodium chloride  3 mL Intravenous Q12H   Continuous Infusions:   PRN Meds:.acetaminophen, acetaminophen, ondansetron (ZOFRAN) IV, ondansetron, oxyCODONE, senna-docusate  Assessment/Plan:  Principal Problem:  *Pyelonephritis due to Escherichia coli Active Problems:  HTN (hypertension)  Hematuria  Hydronephrosis, left  ARF (acute renal failure)  CKD (chronic kidney disease) stage 3, GFR 30-59 ml/min   E Coli Pyelonephritis -Pansensitive. -Progress from Rocephin to oral Cipro -Complete 7-10 days of treatment.  Acute on CKD Stage III -Cr has started to improve. -Had good UOP yesterday >4L). -No acute HD indication. -Renal d/c'd IVF, and as patient is now slightly  hypernatremic they are requesting we push PO fluids and recheck electrolytes in the AM to ensure she can maintain on her own without IVF.  Fever -2/2 pyelo. -Flu PCR negative. -CXR without acute disease.  Hypokalemia -Replete and check am BMET  Hypertension -restarted home dose of Coreg & monitor.  Disposition -Home likely tomorrow if electrolytes are normal and creatinine continues to decrease.   Time spent coordinating care: 30 minutes.   LOS: 7 days   Conley [place] [facility] Pager: [phone] 09/30/2012, 3:22 PM

## 2012-09-30 NOTE — Progress Notes (Signed)
Physical Therapy Evaluation Patient Details Name: [name] MRN: [id number] DOB: 09/30/1923 Today's Date: 09/30/2012 Time: 5621-3086 PT Time Calculation (min): 19 min  PT Assessment / Plan / Recommendation Clinical Impression  Pt is 79 yo female with UTI, pyelonephritis, ARF, who presents with generalized weakness and exhaustion. Pt very unsteady with gait, needing min HHA. Recommend RW and HHPT for d/c. Educated pt on use of RW and will practice next session. Request OT consult. Pt to ambulate 3-5x/ day with assistance until d/c, PT will follow acutely to help pt return to independent level with mobility    PT Assessment  Patient needs continued PT services    Follow Up Recommendations  Home health PT;Supervision for mobility/OOB    Does the patient have the potential to tolerate intense rehabilitation      Barriers to Discharge None pt reports that family can help as much as needed    Equipment Recommendations  Rolling walker with 5" wheels    Recommendations for Other Services OT consult   Frequency Min 3X/week    Precautions / Restrictions Precautions Precautions: Fall Precaution Comments: pt unsteady Restrictions Weight Bearing Restrictions: No   Pertinent Vitals/Pain No c/o pain but extreme fatigue with all activity      Mobility  Bed Mobility Bed Mobility: Sit to Supine Sit to Supine: 6: Modified independent (Device/Increase time) Transfers Transfers: Sit to Stand;Stand to Sit Sit to Stand: 4: Min assist;From chair/3-in-1;With upper extremity assist Stand to Sit: 4: Min assist;To bed;With upper extremity assist Details for Transfer Assistance: min A to steady with transfers Ambulation/Gait Ambulation/Gait Assistance: 4: Min assist Ambulation Distance (Feet): 65 Feet Assistive device: 1 person hand held assist Ambulation/Gait Assistance Details: pt ambulated with HHA because she has never needed an AD, however, she was very unsteady, grabbing to wall on  other side and LOB x3 with min A to correct. RW brought into room and its use demonstrated. pt verbalized understanding. Left pt with the "homework" of ambulating 3-5x/ day with assistance, until discharge Gait Pattern: Step-through pattern Gait velocity: pt tended to rush, vc's to slow down Stairs: No Wheelchair Mobility Wheelchair Mobility: No    Exercises     PT Diagnosis: Abnormality of gait;Generalized weakness  PT Problem List: Decreased strength;Decreased activity tolerance;Decreased balance;Decreased mobility;Decreased cognition;Decreased knowledge of use of DME;Decreased knowledge of precautions PT Treatment Interventions: DME instruction;Gait training;Functional mobility training;Therapeutic activities;Therapeutic exercise;Balance training;Patient/family education   PT Goals Acute Rehab PT Goals PT Goal Formulation: With patient Time For Goal Achievement: 10/07/12 Potential to Achieve Goals: Good Pt will go Sit to Stand: with modified independence PT Goal: Sit to Stand - Progress: Goal set today Pt will go Stand to Sit: with modified independence PT Goal: Stand to Sit - Progress: Goal set today Pt will Ambulate: >150 feet;with least restrictive assistive device;with modified independence PT Goal: Ambulate - Progress: Goal set today  Visit Information  Last PT Received On: 09/30/12 Assistance Needed: +1    Subjective Data  Subjective: I have never felt this bad in my life, even after having children and cancer Patient Stated Goal: return home to care for 73 yo granddaughter   Prior Functioning  Home Living Lives With: Other (Comment) (63 yo granddaughter) Available Help at Discharge: Family;Available PRN/intermittently Type of Home: House Home Access: Level entry Home Layout: One level Home Adaptive Equipment: None Additional Comments: pt reports that sond and daughters can stay with her as needed Prior Function Level of Independence: Independent Able to Take  Stairs?: Yes Driving:  Yes Vocation: Retired Comments: pt kept children in her home but reports that she has retired, she does have custody of her 33 yo granddaughter Musician: No difficulties    Copywriter, advertising Overall Cognitive Status: Impaired Area of Impairment: Memory Arousal/Alertness: Lethargic Orientation Level: Time;Disoriented to Behavior During Session: WFL for tasks performed Memory Deficits: pt reports that she is getting confused, but appropriate on eval    Extremity/Trunk Assessment Right Upper Extremity Assessment RUE ROM/Strength/Tone: WFL for tasks assessed RUE Sensation: WFL - Light Touch RUE Coordination: WFL - gross motor Left Upper Extremity Assessment LUE ROM/Strength/Tone: WFL for tasks assessed LUE Sensation: WFL - Light Touch LUE Coordination: WFL - gross motor Right Lower Extremity Assessment RLE ROM/Strength/Tone: Deficits RLE ROM/Strength/Tone Deficits: generalized weakness noted bilateral LE's, partly due to immobility of the last week, grossly 3+/5 throughout RLE Sensation: WFL - Light Touch;WFL - Proprioception RLE Coordination: WFL - gross motor Left Lower Extremity Assessment LLE ROM/Strength/Tone: Deficits LLE ROM/Strength/Tone Deficits: same as RLE LLE Sensation: WFL - Light Touch;WFL - Proprioception LLE Coordination: WFL - gross motor Trunk Assessment Trunk Assessment: Normal   Balance Balance Balance Assessed: Yes Dynamic Standing Balance Dynamic Standing - Balance Support: No upper extremity supported;During functional activity Dynamic Standing - Level of Assistance: 4: Min assist  End of Session PT - End of Session Equipment Utilized During Treatment: Gait belt Activity Tolerance: Patient limited by fatigue Patient left: in bed;with call bell/phone within reach Nurse Communication: Mobility status  GP   Lyanne Co, PT  Acute Rehab Services  [phone]   Lyanne Co 09/30/2012, 1:36 PM

## 2012-09-30 NOTE — Progress Notes (Signed)
Subjective:  Getting IVF at 50/hour isotonic bicarb Continued excellent UOP Family says she is not eating well Need to be sure she can take in adequate po and ambulate  Objective  Vital signs in last 24 hours: Filed Vitals:   09/29/12 1900 09/29/12 2036 09/30/12 0601 09/30/12 1028  BP: 153/64 175/71 199/93 172/83  Pulse: 75 75 75 84  Temp:  99.1 F (37.3 C) 98.2 F (36.8 C)   TempSrc:  Oral Oral   Resp: 20 18 18    Height:      Weight:   75.2 kg (165 lb 12.6 oz)   SpO2:  98% 93%    Weight change: -2.5 kg (-5 lb 8.2 oz)  Intake/Output Summary (Last 24 hours) at 09/30/12 1051 Last data filed at 09/30/12 0820  Gross per 24 hour  Intake 2157.5 ml  Output   4576 ml  Net -2418.5 ml  Physical Exam:    BP 172/83  Pulse 84  Temp 98.2 F (36.8 C) (Oral)  Resp 18  Ht 5\' 4"  (1.626 m)  Wt 75.2 kg (165 lb 12.6 oz)  BMI 28.46 kg/m2  SpO2 93%Gen- alert elderly AAF no distress Alert and oriented/animated sitting up in the chair Neck- no jvd or bruits Chest- clear bilat, no rales or wheezing Cor- no rub or S3 Abd- soft, nontender, nondistended Ext- no LE edema or effusions Neuro  Ox3, no asterixis  Labs: Basic Metabolic Panel:  Lab 09/30/12 2130 09/29/12 0700 09/28/12 0545 09/27/12 0654 09/26/12 0625 09/25/12 0620 09/24/12 0655  NA 147* 145 142 140 140 138 139  K 3.0* 3.6 3.7 3.9 3.8 3.6 3.5  CL 107 112 113* 109 108 107 103  CO2 25 19 15* 16* 19 19 22   GLUCOSE 135* 123* 111* 107* 130* 123* 157*  BUN 61* 67* 71* 69* 65* 58* 49*  CREATININE 5.29* 5.88* 6.56* 6.72* 6.20* 5.17* 4.06*  ALB -- -- -- -- -- -- --  CALCIUM 9.3 9.1 9.2 9.3 9.5 9.4 9.7  PHOS 5.3* 5.9* 6.5* 6.7* 5.8* -- --   Liver Function Tests:  Lab 09/30/12 0500 09/29/12 0700 09/28/12 0545  AST -- -- --  ALT -- -- --  ALKPHOS -- -- --  BILITOT -- -- --  PROT -- -- --  ALBUMIN 2.5* 2.2* 2.2*   No results found for this basename: LIPASE:3,AMYLASE:3 in the last 168 hours No results found for this basename:  AMMONIA:3 in the last 168 hours CBC:  Lab 09/25/12 0620 09/24/12 0655  WBC 10.2 12.0*  NEUTROABS -- --  HGB 10.1* 9.9*  HCT 30.6* 29.3*  MCV 96.2 96.7  PLT 130* 105*    Scheduled Meds    . atorvastatin  20 mg Oral q1800  . carvedilol  25 mg Oral BID WC  . cefTRIAXone (ROCEPHIN)  IV  1 g Intravenous Q24H  . cloNIDine  0.2 mg Oral BID  . clopidogrel  75 mg Oral Daily  . feeding supplement  1 Container Oral TID BM  . sodium chloride  3 mL Intravenous Q12H      .  sodium bicarbonate 150 mEq in sterile water 1000 mL infusion 50 mL/hr at 09/29/12 2032   Impression/Recommendations  1.  AKI on CKD (creatinine 1.53 January 2013) Due to combination of dehydration/NSAID's/ACEI, UTI and possible recent L stone passage Clinically improving Creatinine clearly falling now Looks and feels better with IVF"s, prob was volume depleted to some degree Currently IVF at 50 Could d/c the isotonic bicarb and see  if she can keep up po Ad lib po fluids Continue to follow creatinine Hope will return to baseline (around 1.5) Needs K replacement - ordered  30 X 2 Recheck labs in the AM  2.  UTI EColi On rocephin 3.  HTN- per primary service   Summary Recs: At this point can d/c IVF, continue to follow i and O and renal function Make sure she can keep up po as she may be having osmotic or post ATN diuresis and if she got dry could delay recovery so no fluid restriction/push po fluids Potassium replacement ordered today and she will require close followup of this Have discussed my recommendations with Adalberto Ill, [facility] Will sign of at this time Please call me if further issues I can help with  Sadie Haber, MD

## 2012-09-30 NOTE — Progress Notes (Signed)
-   Agree with above , have read and analyzed data the note as above. - continue oral Cipro. - Good urine output continues to improve. Appreciate renal assistance.

## 2012-10-01 LAB — RENAL FUNCTION PANEL
Albumin: 2.4 g/dL — ABNORMAL LOW (ref 3.5–5.2)
CO2: 27 mEq/L (ref 19–32)
Calcium: 9.3 mg/dL (ref 8.4–10.5)
Chloride: 108 mEq/L (ref 96–112)
Creatinine, Ser: 4.49 mg/dL — ABNORMAL HIGH (ref 0.50–1.10)
GFR calc Af Amer: 9 mL/min — ABNORMAL LOW (ref >90)
GFR calc non Af Amer: 8 mL/min — ABNORMAL LOW (ref >90)
Sodium: 148 mEq/L — ABNORMAL HIGH (ref 135–145)

## 2012-10-01 LAB — BASIC METABOLIC PANEL
BUN: 53 mg/dL — ABNORMAL HIGH (ref 6–23)
Chloride: 106 mEq/L (ref 96–112)
Creatinine, Ser: 4.27 mg/dL — ABNORMAL HIGH (ref 0.50–1.10)
GFR calc Af Amer: 10 mL/min — ABNORMAL LOW (ref >90)
GFR calc non Af Amer: 9 mL/min — ABNORMAL LOW (ref >90)
Potassium: 3.5 mEq/L (ref 3.5–5.1)

## 2012-10-01 MED ORDER — CLONIDINE HCL 0.3 MG PO TABS
0.3000 mg | ORAL_TABLET | Freq: Two times a day (BID) | ORAL | Status: DC
  Filled 2012-10-01 (×2): qty 1

## 2012-10-01 MED ORDER — DEXTROSE 5 % IV BOLUS
500.0000 mL | Freq: Once | INTRAVENOUS | Status: AC
  Administered 2012-10-01: 500 mL via INTRAVENOUS

## 2012-10-01 MED ORDER — DILTIAZEM HCL ER 60 MG PO CP12
60.0000 mg | ORAL_CAPSULE | Freq: Every day | ORAL | Status: DC
  Administered 2012-10-01 – 2012-10-02 (×2): 60 mg via ORAL
  Filled 2012-10-01 (×2): qty 1

## 2012-10-01 MED ORDER — CIPROFLOXACIN HCL 500 MG PO TABS: 500.0000 mg | ORAL_TABLET | ORAL | Status: DC

## 2012-10-01 MED ORDER — DEXTROSE 5 % IV SOLN
INTRAVENOUS | Status: DC
  Administered 2012-10-01 – 2012-10-02 (×2): via INTRAVENOUS

## 2012-10-01 MED ORDER — CLONIDINE HCL 0.1 MG PO TABS
0.1000 mg | ORAL_TABLET | Freq: Two times a day (BID) | ORAL | Status: DC
  Administered 2012-10-01 – 2012-10-02 (×3): 0.1 mg via ORAL
  Filled 2012-10-01 (×4): qty 1

## 2012-10-01 NOTE — Progress Notes (Signed)
Per md on call Schorr give pt's scheduled 25mg  of coreg early for elevated bp of 171/76. RN to continue to monitor and will either have tech recheck bp or pass on to dayshift staff in report.

## 2012-10-01 NOTE — Progress Notes (Signed)
-   Agree with the, I also agree with IV fluids. And continue D5W. - Check a basic metabolic panel in the morning. Agree with repleting potassium. - Possibly orthostatic. Recheck orthostatic after hydration.

## 2012-10-01 NOTE — Progress Notes (Signed)
Faxed FMLA papers to [phone] for pt. Granddaughter and left her copy at pt. Bedside.  Forbes Cellar, RN

## 2012-10-01 NOTE — Progress Notes (Signed)
NUTRITION FOLLOW UP  Intervention:   1. D/c [employer], pt not drinking 2. Encouraged Po intake, ask for snacks if needed  Nutrition Dx:   Inadequate oral intake related to nausea, vomiting and pain 2/2 pyelonephritis as evidenced by no food or beverage intake for 5 days. improving  Goal:   Pt to meet >/=90% estimated nutrition needs. Likely unmet, but progressing  Monitor:   PO intake, weight trends, I/O's  Assessment:   Pt was planned for d/c but had episode of low blood pressure.  Pt with poor oral intake PTA. Pt reports improved PO intake but has not returned to normal yet. Was ordered for [employer] but has not been drinking in, does not like the taste. States she didn't eat her lunch because she thought it was too salty.   RD provided pt with a snack and encouraged pt to ask RN for snacks if meals are not adequate. Pt agreeable.   Height: Ht Readings from Last 1 Encounters:  09/23/12 5\' 4"  (1.626 m)    Weight Status:   Wt Readings from Last 1 Encounters:  10/01/12 157 lb 6.5 oz (71.4 kg)  trending down, admission weight of 147 lbs   Re-estimated needs:  Kcal: 1650 -1850  Protein: 65 - 80 grams daily  Fluid: 500 ml + urine output    Skin: intact   Diet Order: Cardiac   Intake/Output Summary (Last 24 hours) at 10/01/12 1437 Last data filed at 10/01/12 1100  Gross per 24 hour  Intake   1377 ml  Output   3300 ml  Net  -1923 ml    Last BM: 2/3   Labs:   Lab 10/01/12 1227 10/01/12 0636 09/30/12 0500 09/29/12 0700  NA 145 148* 147* --  K 3.5 3.4* 3.0* --  CL 106 108 107 --  CO2 26 27 25  --  BUN 53* 56* 61* --  CREATININE 4.27* 4.49* 5.29* --  CALCIUM 9.5 9.3 9.3 --  MG -- -- -- --  PHOS -- 5.1* 5.3* 5.9*  GLUCOSE 259* 102* 135* --    CBG (last 3)  No results found for this basename: GLUCAP:3 in the last 72 hours  Scheduled Meds:   . atorvastatin  20 mg Oral q1800  . carvedilol  25 mg Oral BID WC  . ciprofloxacin  500 mg Oral Q24H   . cloNIDine  0.1 mg Oral BID  . clopidogrel  75 mg Oral Daily  . diltiazem  60 mg Oral Daily  . feeding supplement  1 Container Oral TID BM  . pantoprazole  40 mg Oral Daily  . potassium chloride  30 mEq Oral BID  . sodium chloride  3 mL Intravenous Q12H    Continuous Infusions:   . dextrose 500 mL (10/01/12 1202)    Clarene Duke RD, LDN Pager [phone] After Hours pager [phone]

## 2012-10-01 NOTE — Discharge Summary (Signed)
Physician Discharge Summary  [name]:[id number] DOB: 10-Jan-1924 DOA: 09/23/2012  PCP: Rebecca Herter, MD  Admit date: 09/23/2012 Discharge date: 10/01/2012  Time spent: 40 minutes  Recommendations for Outpatient Follow-up:   BMET on Monday 10/05/12.  Send results to Dr. Susann Givens.  Patient has experienced low potassium (requiring supplementation) and elevated sodium (requiring her to drink more fluids) during her hospital stay.  Monitor blood pressure.  BP medications changed.   Lisinopril discontinued (due to ARF),  Coreg and diltiazem maintained.    Discharge Diagnoses:  Principal Problem:  *Pyelonephritis due to Escherichia coli Active Problems:  HTN (hypertension)  Hematuria  Hydronephrosis, left  ARF (acute renal failure)  CKD (chronic kidney disease) stage 3, GFR 30-59 ml/min   Discharge Condition: stable, much improved.  Diet recommendation: renal diet  Filed Weights   09/29/12 0505 09/30/12 0601 10/01/12 0551  Weight: 77.7 kg (171 lb 4.8 oz) 75.2 kg (165 lb 12.6 oz) 71.4 kg (157 lb 6.5 oz)    History of present illness at the time of admission:  Pleasant 79 y/o woman with a h/o HTN, who states 6 days ago had severe left plank pain, dysuria and blood in urine. The pain and dysuria improved but the hematuria never resolved. Today she again had pain, this time with n/v. She has not had anything to eat or drink in 5 days. Positive chills, but has not taken her temp. She is found to have pyelo, as well as ARF and dehydration, and we have been asked to admit her for further evaluation and management. Of note the CT scan showed  mild hydronephrosis but no stones visualized.  Hospital Course:  E Coli Pyelonephritis  -Pansensitive.  -Progress from Rocephin to oral Cipro  -Complete 7-10 days of treatment.   Acute on CKD Stage III  Nephrology was consulted and monitored the patient thru out her stay.  Nephrotoxic medications were held (lisinopril).  The patient  was gently hydrated with IVF.  Her creatinine slow trended down. Unfortunately her sodium trended up and the patient required additional fluids before discharge.  At this point she is stable.  She has been encouraged to drink increased amounts of fluids / water.  She will have a follow up BMET on Monday 2/10.  Fever  Resolved with antibiotic therapy.  Secondary to Pyelonephritis from E-coli infection. Flu PCR was negative.  Cxr was without acute disease.    Hypokalemia  Repleted with oral supplementation.   Hypertension  Slightly elevated (patient complaining of right hand pain from previous carpel tunnel surgery).  On Clonidine, Coreg, and Diltiazem.  Lisinopril d/c'd due to kidney failure.   Consultations:  nephrology  Discharge Exam: Filed Vitals:   09/30/12 1742 09/30/12 2036 09/30/12 2147 10/01/12 0551  BP: 167/73 155/70 155/75 171/76  Pulse: 72 70  66  Temp:  98.3 F (36.8 C)  98.2 F (36.8 C)  TempSrc:  Oral  Oral  Resp:  20  22  Height:      Weight:    71.4 kg (157 lb 6.5 oz)  SpO2:  96%  98%    General: A&O, Lying in bed, appears comfortable Cardiovascular: rrr no m/r/g Respiratory: cta no w/c/r, no accessory muscle use Abdomen:  Soft, Nt, ND, +BS, No masses Extremities:  No edema.  Discharge Instructions     Medication List     As of 10/01/2012  8:29 AM    ASK your doctor about these medications  carvedilol 25 MG tablet   Commonly known as: COREG   Take 25 mg by mouth 2 (two) times daily with a meal.      cloNIDine 0.1 MG tablet   Commonly known as: CATAPRES   Take 0.1 mg by mouth at bedtime.      clopidogrel 75 MG tablet   Commonly known as: PLAVIX   Take 75 mg by mouth daily.      diltiazem 60 MG 12 hr capsule   Commonly known as: CARDIZEM SR   Take 60 mg by mouth daily.      ibuprofen 800 MG tablet   Commonly known as: ADVIL,MOTRIN   Take 1 tablet (800 mg total) by mouth as needed. For pain.      lisinopril 40 MG tablet   Commonly  known as: PRINIVIL,ZESTRIL   Take 40 mg by mouth daily.      simvastatin 40 MG tablet   Commonly known as: ZOCOR   Take 40 mg by mouth daily.          The results of significant diagnostics from this hospitalization (including imaging, microbiology, ancillary and laboratory) are listed below for reference.    Significant Diagnostic Studies: Ct Abdomen Pelvis Wo Contrast  09/23/2012  *RADIOLOGY REPORT*  Clinical Data: Generalized abdominal pain, vomiting, hematuria.  CT ABDOMEN AND PELVIS WITHOUT CONTRAST  Technique:  Multidetector CT imaging of the abdomen and pelvis was performed following the standard protocol without intravenous contrast.  Comparison: 05/27/2006  Findings: Small to moderate hiatal hernia, stable.  Dependent atelectasis in the lung bases.  No effusions.  Heart is mildly enlarged.  Gallstones fill the gallbladder, stable.  Liver, stomach, spleen, pancreas, adrenals have an unremarkable unenhanced appearance.  Mild left hydronephrosis and perinephric stranding.  The left ureter is decompressed.  No definite visualized ureteral stone. Multiple calcifications in the anatomic pelvis felt represent phleboliths.  Urinary bladder is grossly unremarkable. Areas of scarring in the right kidney without hydronephrosis.  Prior hysterectomy.  Scattered sigmoid diverticula.  No active diverticulitis.  Small bowel is decompressed.  No free fluid, free air or adenopathy.  Aorta and iliac vessels are heavily calcified, non-aneurysmal.  The degenerative changes in the lumbar spine.  No acute bony abnormality.  IMPRESSION: Mild left hydronephrosis and perinephric stranding.  The left ureter is decompressed, and I see no definite ureteral stones. Question recent passage of stone.  Cholelithiasis.  Small to moderate hiatal hernia.  Sigmoid diverticulosis.   Original Report Authenticated By: Charlett Nose, M.D.    US Renal  09/24/2012  *RADIOLOGY REPORT*  Clinical Data: Worsening acute renal failure.   Question hydronephrosis.  RENAL/URINARY TRACT ULTRASOUND COMPLETE  Comparison:  09/23/2012 CT.  06/05/2010 abdominal sonogram.  Findings:  Right Kidney:  11.6 cm.  Mild increased echogenicity. No hydronephrosis or renal mass.  Left Kidney:  10.9 cm.  Parapelvic cysts suspected in addition to mild left renal collecting system fullness.  Bladder:  Post void and decompressed.  IMPRESSION: Left-sided parapelvic cysts suspected in addition to mild left renal collecting system fullness.   Original Report Authenticated By: Lacy Duverney, M.D.    Dg Chest Port 1 View  09/23/2012  *RADIOLOGY REPORT*  Clinical Data: Cough.  Fever.  PORTABLE CHEST - 1 VIEW  Comparison: 06/27/2011  Findings: Tortuous thoracic aorta noted.  Mild cardiomegaly noted with cardiothoracic index 60% on this AP radiograph.  Lungs appear clear.  Thoracic spondylosis noted.  No blunting of the lateral costophrenic angles.  IMPRESSION:  1.  Tortuous  thoracic aorta with atherosclerotic calcification. 2.  Mild cardiomegaly, without edema. 3.  Thoracic spondylosis.   Original Report Authenticated By: Gaylyn Rong, M.D.     Microbiology: Recent Results (from the past 240 hour(s))  URINE CULTURE     Status: Normal   Collection Time   09/23/12 10:03 AM      Component Value Range Status Comment   Specimen Description URINE, CLEAN CATCH   Final    Special Requests NONE   Final    Culture  Setup Time 09/23/2012 16:20   Final    Colony Count >=100,000 COLONIES/ML   Final    Culture ESCHERICHIA COLI   Final    Report Status 09/25/2012 FINAL   Final    Organism ID, Bacteria ESCHERICHIA COLI   Final   CULTURE, BLOOD (ROUTINE X 2)     Status: Normal   Collection Time   09/23/12  5:35 PM      Component Value Range Status Comment   Specimen Description BLOOD RIGHT ARM   Final    Special Requests BOTTLES DRAWN AEROBIC AND ANAEROBIC 10CC   Final    Culture  Setup Time 09/23/2012 22:17   Final    Culture NO GROWTH 5 DAYS   Final    Report Status  09/29/2012 FINAL   Final   CULTURE, BLOOD (ROUTINE X 2)     Status: Normal   Collection Time   09/23/12  5:45 PM      Component Value Range Status Comment   Specimen Description BLOOD RIGHT HAND   Final    Special Requests BOTTLES DRAWN AEROBIC AND ANAEROBIC 10CC   Final    Culture  Setup Time 09/23/2012 22:17   Final    Culture NO GROWTH 5 DAYS   Final    Report Status 09/29/2012 FINAL   Final      Labs: Basic Metabolic Panel:  Lab 10/01/12 1610 09/30/12 0500 09/29/12 0700 09/28/12 0545 09/27/12 0654  NA 148* 147* 145 142 140  K 3.4* 3.0* 3.6 3.7 3.9  CL 108 107 112 113* 109  CO2 27 25 19  15* 16*  GLUCOSE 102* 135* 123* 111* 107*  BUN 56* 61* 67* 71* 69*  CREATININE 4.49* 5.29* 5.88* 6.56* 6.72*  CALCIUM 9.3 9.3 9.1 9.2 9.3  MG -- -- -- -- --  PHOS 5.1* 5.3* 5.9* 6.5* 6.7*   Liver Function Tests:  Lab 10/01/12 0636 09/30/12 0500 09/29/12 0700 09/28/12 0545 09/27/12 0654  AST -- -- -- -- --  ALT -- -- -- -- --  ALKPHOS -- -- -- -- --  BILITOT -- -- -- -- --  PROT -- -- -- -- --  ALBUMIN 2.4* 2.5* 2.2* 2.2* 2.3*   CBC:  Lab 09/25/12 0620  WBC 10.2  NEUTROABS --  HGB 10.1*  HCT 30.6*  MCV 96.2  PLT 130*   Cardiac Enzymes:  Lab 09/26/12 0625  CKTOTAL 57  CKMB --  CKMBINDEX --  TROPONINI --     SignedConley Canal [phone]  [facility] 10/01/2012, 8:29 AM

## 2012-10-01 NOTE — Progress Notes (Signed)
While taking orthostatic BP and given pt. Am meds, she vomited about 300 ml of yellowish emesis mixed with peaches she ate for breakfast.  Will notify Dr. Radonna Ricker and continue to monitor.  Forbes Cellar, RN

## 2012-10-01 NOTE — Progress Notes (Signed)
  Subjective:  Patient reports No pain.  Eating better  Objective: Vital signs in last 24 hours: Temp:  [97.9 F (36.6 C)-98.3 F (36.8 C)] 97.9 F (36.6 C) (02/06 1505) Pulse Rate:  [63-73] 67  (02/06 1727) Resp:  [20-22] 20  (02/06 1505) BP: (133-202)/(42-82) 133/42 mmHg (02/06 1727) SpO2:  [96 %-98 %] 96 % (02/06 1505) Weight:  [157 lb 6.5 oz (71.4 kg)] 157 lb 6.5 oz (71.4 kg) (02/06 0551)  Intake/Output from previous day: 02/05 0701 - 02/06 0700 In: 2214 [P.O.:1740] Out: 3225 [Urine:3225] Intake/Output this shift:    Physical Exam:  Alert and oriented Lungs - Normal respiratory effort, chest expands symmetrically.  Abdomen - Soft, non-tender & non-distended Creatinine: 4.27 Urinary output: 3225 ml Lab Results: No results found for this basename: HGB:3,HCT:3 in the last 72 hours BMET  Basename 10/01/12 1227 10/01/12 0636  NA 145 148*  K 3.5 3.4*  CL 106 108  CO2 26 27  GLUCOSE 259* 102*  BUN 53* 56*  CREATININE 4.27* 4.49*  CALCIUM 9.5 9.3   No results found for this basename: LABPT:3,INR:3 in the last 72 hours No results found for this basename: LABURIN:1 in the last 72 hours Results for orders placed during the hospital encounter of 09/23/12  URINE CULTURE     Status: Normal   Collection Time   09/23/12 10:03 AM      Component Value Range Status Comment   Specimen Description URINE, CLEAN CATCH   Final    Special Requests NONE   Final    Culture  Setup Time 09/23/2012 16:20   Final    Colony Count >=100,000 COLONIES/ML   Final    Culture ESCHERICHIA COLI   Final    Report Status 09/25/2012 FINAL   Final    Organism ID, Bacteria ESCHERICHIA COLI   Final   CULTURE, BLOOD (ROUTINE X 2)     Status: Normal   Collection Time   09/23/12  5:35 PM      Component Value Range Status Comment   Specimen Description BLOOD RIGHT ARM   Final    Special Requests BOTTLES DRAWN AEROBIC AND ANAEROBIC 10CC   Final    Culture  Setup Time 09/23/2012 22:17   Final    Culture  NO GROWTH 5 DAYS   Final    Report Status 09/29/2012 FINAL   Final   CULTURE, BLOOD (ROUTINE X 2)     Status: Normal   Collection Time   09/23/12  5:45 PM      Component Value Range Status Comment   Specimen Description BLOOD RIGHT HAND   Final    Special Requests BOTTLES DRAWN AEROBIC AND ANAEROBIC 10CC   Final    Culture  Setup Time 09/23/2012 22:17   Final    Culture NO GROWTH 5 DAYS   Final    Report Status 09/29/2012 FINAL   Final     Studies/Results: No results found.  Assessment/Plan:  Renal insufficiency  No need for urologic follow-up at this time. Please call if we are needed   LOS: 8 days   [name] 10/01/2012, 7:13 PM

## 2012-10-01 NOTE — Progress Notes (Signed)
PT Cancellation Note  Patient Details Name: [name] MRN: [id number] DOB: 1923/12/12   Cancelled Treatment:      Treatment cancelled today due to high BP and nausea/vommiting. Will follow-up tomorrow.  Greggory Stallion 10/01/2012, 12:05 PM

## 2012-10-01 NOTE — Progress Notes (Signed)
Dr. Radonna Ricker informed of repeat orthostatic VS, lying 199/68, 71, sitting 202/82,  65 and standing 183/76, 68.  Ordered to give Cardizem 60 mg now.  Will continue to monitor and carry out orders.  Forbes Cellar, RN

## 2012-10-01 NOTE — Progress Notes (Signed)
Dr. Radonna Ricker ordered to re-check orthostatic again in 1 hour and not to give cardizem now.  Will continue to monitor and carry out orders.  Forbes Cellar, RN

## 2012-10-01 NOTE — Progress Notes (Signed)
Occupational Therapy Evaluation Patient Details Name: [name] MRN: [id number] DOB: June 10, 1924 Today's Date: 10/01/2012 Time: 8657-8469 OT Time Calculation (min): 27 min  OT Assessment / Plan / Recommendation Clinical Impression  79 yo admitted for pyelo, ARF and dehydration. 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 On eval, pt c/o dizziness and nausea. Returned to sitting. BP 201/82. Nsg aware. Pt not appropriate for D/C today due to unsteady gait and mobilty due to high BP. Pt will need HHOT at D/C. Pt will also need RW.Pt will benefit from skilled OT services to max independence with ADL and functinal mobility for ADL to faciliatate safe D/C home with intermittent S of friends/family.    OT Assessment  Patient needs continued OT Services    Follow Up Recommendations  Home health OT;Supervision - Intermittent (home health aide)    Barriers to Discharge Decreased caregiver support unsure of family support. Pt spoke of daughter who can assist, but according to pt this daughter actively drinks/does drugs.  Equipment Recommendations  Other (comment) (RW)    Recommendations for Other Services    Frequency  Min 2X/week    Precautions / Restrictions Restrictions Weight Bearing Restrictions: No   Pertinent Vitals/Pain 6. Stomach. BP 201/82 sitting. nsg notified    ADL  Eating/Feeding: Independent Where Assessed - Eating/Feeding: Chair Grooming: Min guard;Set up Where Assessed - Grooming: Supported standing Upper Body Bathing: Set up;Supervision/safety Where Assessed - Upper Body Bathing: Supported sitting Lower Body Bathing: Minimal assistance Where Assessed - Lower Body Bathing: Supported sit to stand Upper Body Dressing: Supervision/safety;Set up Where Assessed - Upper Body Dressing: Supported sitting Lower Body Dressing: Minimal assistance Where Assessed - Lower Body Dressing: Supported sit to stand Toilet Transfer: Minimal  Dentist Method: Sit to Barista: Bedside commode Toileting - Architect and Hygiene: Min guard Where Assessed - Engineer, mining and Hygiene: Sit to stand from 3-in-1 or toilet Tub/Shower Transfer: Minimal assistance Tub/Shower Transfer Method: Science writer: Walk in shower Equipment Used: Gait belt;Rolling walker Transfers/Ambulation Related to ADLs: min A ADL Comments: Limited by dizziness. Pt returned to chair. see BP    OT Diagnosis: Generalized weakness;Acute pain  OT Problem List: Decreased strength;Decreased activity tolerance;Impaired balance (sitting and/or standing);Decreased knowledge of use of DME or AE;Cardiopulmonary status limiting activity;Obesity;Pain OT Treatment Interventions: Self-care/ADL training;Therapeutic exercise;Energy conservation;DME and/or AE instruction;Therapeutic activities;Patient/family education   OT Goals Acute Rehab OT Goals OT Goal Formulation: With patient Time For Goal Achievement: 10/15/12 Potential to Achieve Goals: Good ADL Goals Pt Will Perform Grooming: with modified independence;Standing at sink;Unsupported ADL Goal: Grooming - Progress: Goal set today Pt Will Perform Lower Body Bathing: with set-up;Sit to stand from chair;Unsupported ADL Goal: Lower Body Bathing - Progress: Goal set today Pt Will Perform Lower Body Dressing: Sit to stand from chair;with set-up;Unsupported ADL Goal: Lower Body Dressing - Progress: Goal set today Pt Will Transfer to Toilet: with modified independence;3-in-1;Ambulation;with DME ADL Goal: Toilet Transfer - Progress: Goal set today Pt Will Perform Toileting - Clothing Manipulation: with modified independence;Standing ADL Goal: Toileting - Clothing Manipulation - Progress: Goal set today Pt Will Perform Toileting - Hygiene: with modified independence;Sit to stand from 3-in-1/toilet;Standing at 3-in-1/toilet ADL  Goal: Toileting - Hygiene - Progress: Goal set today Additional ADL Goal #1: Verbalize 2 energy conservations techniques for ADL ADL Goal: Additional Goal #1 - Progress: Goal set today  Visit Information  Last OT Received On: 10/01/12 Assistance Needed: +1  Subjective Data  Subjective: My 75 yo grand daughter lives with me   Prior Functioning     Home Living Lives With: Family (lives with 77 yo) Available Help at Discharge: Available PRN/intermittently Type of Home: House Home Access: Level entry Home Layout: One level Bathroom Shower/Tub: Health visitor: Handicapped height Bathroom Accessibility: Yes How Accessible: Accessible via walker Home Adaptive Equipment: None Additional Comments: Can find somebody to stay at night Prior Function Level of Independence: Independent Able to Take Stairs?: Yes Driving: Yes Vocation: Retired Musician: No difficulties Dominant Hand: Right         Vision/Perception Vision - History Baseline Vision: Wears glasses only for reading   Cognition  Cognition Overall Cognitive Status: Appears within functional limits for tasks assessed/performed Arousal/Alertness: Awake/alert Orientation Level: Appears intact for tasks assessed Behavior During Session: [facility] for tasks performed    Extremity/Trunk Assessment Right Upper Extremity Assessment RUE ROM/Strength/Tone: Within functional levels RUE Sensation: WFL - Light Touch;WFL - Proprioception RUE Coordination: WFL - gross/fine motor Left Upper Extremity Assessment LUE ROM/Strength/Tone: WFL for tasks assessed LUE Sensation: WFL - Light Touch;WFL - Proprioception LUE Coordination: WFL - gross/fine motor Right Lower Extremity Assessment RLE ROM/Strength/Tone: WFL for tasks assessed RLE Sensation: WFL - Light Touch;WFL - Proprioception RLE Coordination: WFL - gross/fine motor Left Lower Extremity Assessment LLE ROM/Strength/Tone: WFL for tasks  assessed LLE Sensation: WFL - Light Touch;WFL - Proprioception LLE Coordination: WFL - gross/fine motor Trunk Assessment Trunk Assessment: Normal     Mobility Bed Mobility Bed Mobility: Not assessed Transfers Transfers: Sit to Stand;Stand to Sit Sit to Stand: 4: Min guard;With upper extremity assist;From chair/3-in-1 Stand to Sit: 4: Min assist;With upper extremity assist;To chair/3-in-1 Details for Transfer Assistance: uncontrolled descent     Exercise     Balance Balance Balance Assessed: Yes Static Standing Balance Static Standing - Balance Support: Bilateral upper extremity supported Static Standing - Level of Assistance: 4: Min assist (unsteady due to c/o dizzinerss) Static Standing - Comment/# of Minutes: 8 Dynamic Standing Balance Dynamic Standing - Balance Support: Bilateral upper extremity supported Dynamic Standing - Level of Assistance: 4: Min assist   End of Session OT - End of Session Equipment Utilized During Treatment: Gait belt Activity Tolerance: Treatment limited secondary to medical complications (Comment) (BP) Patient left: in chair;with call bell/phone within reach Nurse Communication: Mobility status;Other (comment);Patient requests pain meds (BP)  GO     Lowell Mcgurk,HILLARY 10/01/2012, 9:41 AM [facility], OTR/L  [phone] 10/01/2012

## 2012-10-01 NOTE — Progress Notes (Signed)
Texted paged Dr. Radonna Ricker pt. Orthostatic VS, lying 195/76, 63, sitting 199/76, 64 and standing 178/75, 73.  Will continue to monitor and await for call back or new orders.  Forbes Cellar, RN

## 2012-10-01 NOTE — Progress Notes (Signed)
Pt had an elevated bp of 171/76. RN paged np on call awaiting further orders.

## 2012-10-01 NOTE — Progress Notes (Signed)
[facility]             Progress Note   Subjective: Patient with pain in her right hand (5 months out from carpel tunnel release) and some dizziness when standing.  Objective: Vital signs in last 24 hours: Temp:  [98 F (36.7 C)-98.3 F (36.8 C)] 98.2 F (36.8 C) (02/06 0551) Pulse Rate:  [66-84] 68  (02/06 0835) Resp:  [18-22] 22  (02/06 0551) BP: (131-201)/(66-83) 201/82 mmHg (02/06 0900) SpO2:  [96 %-99 %] 96 % (02/06 0900) Weight:  [71.4 kg (157 lb 6.5 oz)] 71.4 kg (157 lb 6.5 oz) (02/06 0551) Weight change: -3.8 kg (-8 lb 6 oz) Last BM Date: 10/01/12  Intake/Output from previous day: 02/05 0701 - 02/06 0700 In: 2214 [P.O.:1740] Out: 3225 [Urine:3225]     Physical Exam: General: A&O, Lying in bed, appears comfortable  Cardiovascular: rrr no m/r/g  Respiratory: cta no w/c/r, no accessory muscle use  Abdomen: Soft, Nt, ND, +BS, No masses  Extremities: No edema.   Lab Results: Basic Metabolic Panel:  Basename 10/01/12 0636 09/30/12 0500  NA 148* 147*  K 3.4* 3.0*  CL 108 107  CO2 27 25  GLUCOSE 102* 135*  BUN 56* 61*  CREATININE 4.49* 5.29*  CALCIUM 9.3 9.3  MG -- --  PHOS 5.1* 5.3*   Liver Function Tests:  Basename 10/01/12 0636 09/30/12 0500  AST -- --  ALT -- --  ALKPHOS -- --  BILITOT -- --  PROT -- --  ALBUMIN 2.4* 2.5*     Recent Results (from the past 240 hour(s))  URINE CULTURE     Status: Normal   Collection Time   09/23/12 10:03 AM      Component Value Range Status Comment   Specimen Description URINE, CLEAN CATCH   Final    Special Requests NONE   Final    Culture  Setup Time 09/23/2012 16:20   Final    Colony Count >=100,000 COLONIES/ML   Final    Culture ESCHERICHIA COLI   Final    Report Status 09/25/2012 FINAL   Final    Organism ID, Bacteria ESCHERICHIA COLI   Final   CULTURE, BLOOD (ROUTINE X 2)     Status: Normal   Collection Time   09/23/12  5:35 PM      Component Value Range Status Comment   Specimen  Description BLOOD RIGHT ARM   Final    Special Requests BOTTLES DRAWN AEROBIC AND ANAEROBIC 10CC   Final    Culture  Setup Time 09/23/2012 22:17   Final    Culture NO GROWTH 5 DAYS   Final    Report Status 09/29/2012 FINAL   Final   CULTURE, BLOOD (ROUTINE X 2)     Status: Normal   Collection Time   09/23/12  5:45 PM      Component Value Range Status Comment   Specimen Description BLOOD RIGHT HAND   Final    Special Requests BOTTLES DRAWN AEROBIC AND ANAEROBIC 10CC   Final    Culture  Setup Time 09/23/2012 22:17   Final    Culture NO GROWTH 5 DAYS   Final    Report Status 09/29/2012 FINAL   Final     Studies/Results: No results found.  Medications: Scheduled Meds:    . atorvastatin  20 mg Oral q1800  . carvedilol  25 mg Oral BID WC  . ciprofloxacin  500 mg Oral Q24H  . cloNIDine  0.1 mg Oral BID  .  clopidogrel  75 mg Oral Daily  . dextrose  500 mL Intravenous Once  . diltiazem  60 mg Oral Daily  . feeding supplement  1 Container Oral TID BM  . pantoprazole  40 mg Oral Daily  . potassium chloride  30 mEq Oral BID  . sodium chloride  3 mL Intravenous Q12H   Continuous Infusions:   PRN Meds:.acetaminophen, acetaminophen, ondansetron (ZOFRAN) IV, ondansetron, oxyCODONE, senna-docusate  Assessment/Plan:  Principal Problem:  *Pyelonephritis due to Escherichia coli Active Problems:  HTN (hypertension)  Hematuria  Hydronephrosis, left  ARF (acute renal failure)  CKD (chronic kidney disease) stage 3, GFR 30-59 ml/min   E Coli Pyelonephritis -Pansensitive. -Progress from Rocephin now on cipro 500 mg bid -Complete 10 days of treatment.  Acute on CKD Stage III -Creatinine continues to improve - has good urine output (? Intravascular depletion given patient's complaint of dizziness) - resumed IVF (d5w bolus) as patient's sodium continues to rise.  If we can re-hydrate her, perhaps she will be stable until her kidneys recover. (re-check bmet at  noon)  Fever -Resolved. -2/2 pyelo. -Flu PCR negative. -CXR without acute disease.  Hypokalemia -Continuing with oral supplementation  Hypertension -restarted diltiazem & Coreg in addition to clonidine.  Continue to monitor. -Check Orthostatics  Disposition Home with home health when appropriate.  Time spent coordinating care: 30 minutes.   LOS: 8 days   Conley Canal [facility] Pager: [phone] 10/01/2012, 9:44 AM

## 2012-10-02 LAB — RENAL FUNCTION PANEL
Albumin: 2.4 g/dL — ABNORMAL LOW (ref 3.5–5.2)
CO2: 26 mEq/L (ref 19–32)
Calcium: 9.2 mg/dL (ref 8.4–10.5)
Creatinine, Ser: 3.81 mg/dL — ABNORMAL HIGH (ref 0.50–1.10)
GFR calc Af Amer: 11 mL/min — ABNORMAL LOW (ref >90)
GFR calc non Af Amer: 10 mL/min — ABNORMAL LOW (ref >90)
Sodium: 140 mEq/L (ref 135–145)

## 2012-10-02 MED ORDER — ATORVASTATIN CALCIUM 10 MG PO TABS: 10.0000 mg | ORAL_TABLET | Freq: Every day | ORAL | Status: DC

## 2012-10-02 MED ORDER — CLONIDINE HCL 0.1 MG PO TABS: 0.2000 mg | ORAL_TABLET | Freq: Every day | ORAL | Status: DC

## 2012-10-02 MED ORDER — CLONIDINE HCL 0.2 MG PO TABS: 0.2000 mg | ORAL_TABLET | Freq: Two times a day (BID) | ORAL | Status: DC

## 2012-10-02 NOTE — Progress Notes (Signed)
[name] to be D/C'd Home per MD order.  Discussed with the patient and all questions fully answered.   Audreena, Sachdeva  Home Medication Instructions HQI:[id number]   Printed on:10/02/12 1319  Medication Information                    carvedilol (COREG) 25 MG tablet Take 25 mg by mouth 2 (two) times daily with a meal.            diltiazem (CARDIZEM SR) 60 MG 12 hr capsule Take 60 mg by mouth daily.           clopidogrel (PLAVIX) 75 MG tablet Take 75 mg by mouth daily.           atorvastatin (LIPITOR) 10 MG tablet Take 1 tablet (10 mg total) by mouth daily at 6 PM.           cloNIDine (CATAPRES) 0.2 MG tablet Take 1 tablet (0.2 mg total) by mouth 2 (two) times daily.             VVS, Skin clean, dry and intact without evidence of skin break down, no evidence of skin tears noted. IV catheter discontinued intact. Site without signs and symptoms of complications. Dressing and pressure applied.  An After Visit Summary was printed and given to the patient. Patient escorted via WC, and D/C home via private auto.  VIANN, NIELSON D 10/02/2012 1:19 PM

## 2012-10-02 NOTE — Progress Notes (Signed)
Physical Therapy Treatment Patient Details Name: [name] MRN: [id number] DOB: Apr 09, 1924 Today's Date: 10/02/2012 Time: 4098-1191 PT Time Calculation (min): 13 min  PT Assessment / Plan / Recommendation Comments on Treatment Session  Pt adm with pyelonephritis, ARF, and weakness.  Pt did well using rolling walker.     Follow Up Recommendations  Home health PT;Supervision - Intermittent     Does the patient have the potential to tolerate intense rehabilitation     Barriers to Discharge        Equipment Recommendations  Rolling walker with 5" wheels    Recommendations for Other Services    Frequency Min 3X/week   Plan Discharge plan remains appropriate;Frequency remains appropriate    Precautions / Restrictions Precautions Precautions: Fall Restrictions Weight Bearing Restrictions: No   Pertinent Vitals/Pain N/A    Mobility  Transfers Sit to Stand: 6: Modified independent (Device/Increase time);With upper extremity assist;With armrests;From chair/3-in-1 Stand to Sit: 6: Modified independent (Device/Increase time);With upper extremity assist;With armrests;To chair/3-in-1 Ambulation/Gait Ambulation/Gait Assistance: 5: Supervision Ambulation Distance (Feet): 200 Feet Assistive device: Rolling walker Ambulation/Gait Assistance Details: Steady with rolling walker. Gait Pattern: Step-through pattern;Decreased stride length    Exercises     PT Diagnosis:    PT Problem List:   PT Treatment Interventions:     PT Goals Acute Rehab PT Goals PT Goal: Sit to Stand - Progress: Met PT Goal: Stand to Sit - Progress: Met PT Goal: Ambulate - Progress: Progressing toward goal  Visit Information  Last PT Received On: 10/02/12 Assistance Needed: +1    Subjective Data  Subjective: "I hope so," pt stated about possibly going home today.   Cognition  Cognition Overall Cognitive Status: Appears within functional limits for tasks assessed/performed Arousal/Alertness:  Awake/alert Orientation Level: Appears intact for tasks assessed Behavior During Session: [facility] for tasks performed    Balance  Static Standing Balance Static Standing - Balance Support: Bilateral upper extremity supported Static Standing - Level of Assistance: 6: Modified independent (Device/Increase time)  End of Session PT - End of Session Activity Tolerance: Patient tolerated treatment well Patient left: in chair;with call bell/phone within reach Nurse Communication: Mobility status   GP     [facility] 10/02/2012, 10:29 AM  Skip Mayer PT [phone]

## 2012-10-02 NOTE — Discharge Summary (Signed)
Physician Discharge Summary  [name] OZH:[id number] DOB: 1924-08-13 DOA: 09/23/2012  PCP: Carollee Herter, MD  Admit date: 09/23/2012 Discharge date: 10/02/2012  Time spent: 45  minutes  Recommendations for Outpatient Follow-up:  BMET on Monday 10/05/12. Send results to Dr. Susann Givens.   Patient has experienced low potassium (requiring supplementation) and elevated sodium (requiring her to drink more fluids) during her hospital stay.   Monitor blood pressure. BP medications changed. Lisinopril discontinued (due to ARF), Coreg and diltiazem maintained.   Discharge Diagnoses:  Principal Problem:  *Pyelonephritis due to Escherichia coli Active Problems:  HTN (hypertension)  Hematuria  Hydronephrosis, left  ARF (acute renal failure)  CKD (chronic kidney disease) stage 3, GFR 30-59 ml/min   Discharge Condition: Stable, improved  Diet recommendation: Low fat heart healthy  Filed Weights   09/30/12 0601 10/01/12 0551 10/02/12 0429  Weight: 75.2 kg (165 lb 12.6 oz) 71.4 kg (157 lb 6.5 oz) 71.3 kg (157 lb 3 oz)    History of present illness:  This and for a whole and pleasant 79 year old female with a history of hypertension, who had severe left flank pain dysuria and blood in her urine starting 6 days prior to admission. At the time of admission she reported she had had nothing to eat or drink in 5 days. She was suffering with chills. In the emergency department she was found to have pyelonephritis as well as acute renal failure and dehydration.  Hospital Course:   E Coli Pyelonephritis  [name] urine culture grew Escherichia coli that was pansensitive.  She was treated with IV Rocephin and then was progressed to oral Cipro.  She completed 10 days of antibiotic therapy in the hospital  Acute on CKD Stage III  Nephrology was consulted and monitored the patient thru out her stay. Nephrotoxic medications were held (lisinopril). The patient was gently hydrated with IVF.  Her creatinine slow trended down. Unfortunately her sodium trended up and the patient required additional fluids before discharge. At this point she is stable. She has been encouraged to drink increased amounts of fluids / water. She will have a follow up BMET on Monday 2/10.   Fever  Resolved with antibiotic therapy. Secondary to Pyelonephritis from E-coli infection. Flu PCR was negative. Cxr was without acute disease.   Hypokalemia  Repleted with oral supplementation.   Hypertension  Slightly elevated.  On Clonidine (increased to 0.2 bid), Coreg, and Diltiazem. Lisinopril d/c'd due to kidney failure.  On 10/01/12 [name] was being considered for discharge.  Unfortunately she became dizzy on standing and her serum sodium was noted to be rising.  She was kept in the hospital for one more day to receive IV fluids and build her reserves so that she would remain stable as an outpatient until her kidneys normalize.  Consultations:  Nephrology  Discharge Exam: Filed Vitals:   10/01/12 2118 10/02/12 0429 10/02/12 0802 10/02/12 0900  BP: 130/68 155/62 165/69 160/61  Pulse: 59 65 66 66  Temp: 98.4 F (36.9 C) 98.5 F (36.9 C)    TempSrc: Oral Oral    Resp: 20 18    Height:      Weight:  71.3 kg (157 lb 3 oz)    SpO2: 98% 96%     General: Well-developed African American female, lying comfortably in bed Neck: Supple, No lymphadenopathy, no thyromegaly Cardiovascular: Regular rate and rhythm with soft systolic murmur, no rubs or gallops, no lower extremity edema Respiratory: No wheezes crackles or rales, no accessory muscle  use Abdomen: Soft, nontender, nondistended, positive bowel sounds, no masses Extremities: No clubbing cyanosis or edema.   Discharge Instructions  Discharge Orders    Future Appointments: Provider: Department: Dept Phone: Center:   10/08/2012 10:30 AM Ronnald Nian, MD [facility] [phone] [facility]     Future Orders Please Complete By Expires    Diet - low sodium heart healthy      Increase activity slowly          Medication List     As of 10/02/2012  2:28 PM    STOP taking these medications         ibuprofen 800 MG tablet   Commonly known as: ADVIL,MOTRIN      lisinopril 40 MG tablet   Commonly known as: PRINIVIL,ZESTRIL      simvastatin 40 MG tablet   Commonly known as: ZOCOR      TAKE these medications         atorvastatin 10 MG tablet   Commonly known as: LIPITOR   Take 1 tablet (10 mg total) by mouth daily at 6 PM.      carvedilol 25 MG tablet   Commonly known as: COREG   Take 25 mg by mouth 2 (two) times daily with a meal.      cloNIDine 0.2 MG tablet   Commonly known as: CATAPRES   Take 1 tablet (0.2 mg total) by mouth 2 (two) times daily.      clopidogrel 75 MG tablet   Commonly known as: PLAVIX   Take 75 mg by mouth daily.      diltiazem 60 MG 12 hr capsule   Commonly known as: CARDIZEM SR   Take 60 mg by mouth daily.           Follow-up Information    Follow up with Carollee Herter, MD. Schedule an appointment as soon as possible for a visit in 1 week.   Contact information:   [address] [place] [address] [phone]           The results of significant diagnostics from this hospitalization (including imaging, microbiology, ancillary and laboratory) are listed below for reference.    Significant Diagnostic Studies: Ct Abdomen Pelvis Wo Contrast  09/23/2012  *RADIOLOGY REPORT*  Clinical Data: Generalized abdominal pain, vomiting, hematuria.  CT ABDOMEN AND PELVIS WITHOUT CONTRAST  Technique:  Multidetector CT imaging of the abdomen and pelvis was performed following the standard protocol without intravenous contrast.  Comparison: 05/27/2006  Findings: Small to moderate hiatal hernia, stable.  Dependent atelectasis in the lung bases.  No effusions.  Heart is mildly enlarged.  Gallstones fill the gallbladder, stable.  Liver, stomach, spleen, pancreas, adrenals have an  unremarkable unenhanced appearance.  Mild left hydronephrosis and perinephric stranding.  The left ureter is decompressed.  No definite visualized ureteral stone. Multiple calcifications in the anatomic pelvis felt represent phleboliths.  Urinary bladder is grossly unremarkable. Areas of scarring in the right kidney without hydronephrosis.  Prior hysterectomy.  Scattered sigmoid diverticula.  No active diverticulitis.  Small bowel is decompressed.  No free fluid, free air or adenopathy.  Aorta and iliac vessels are heavily calcified, non-aneurysmal.  The degenerative changes in the lumbar spine.  No acute bony abnormality.  IMPRESSION: Mild left hydronephrosis and perinephric stranding.  The left ureter is decompressed, and I see no definite ureteral stones. Question recent passage of stone.  Cholelithiasis.  Small to moderate hiatal hernia.  Sigmoid diverticulosis.   Original Report Authenticated By:  Charlett Nose, M.D.    US Renal  09/24/2012  *RADIOLOGY REPORT*  Clinical Data: Worsening acute renal failure.  Question hydronephrosis.  RENAL/URINARY TRACT ULTRASOUND COMPLETE  Comparison:  09/23/2012 CT.  06/05/2010 abdominal sonogram.  Findings:  Right Kidney:  11.6 cm.  Mild increased echogenicity. No hydronephrosis or renal mass.  Left Kidney:  10.9 cm.  Parapelvic cysts suspected in addition to mild left renal collecting system fullness.  Bladder:  Post void and decompressed.  IMPRESSION: Left-sided parapelvic cysts suspected in addition to mild left renal collecting system fullness.   Original Report Authenticated By: Lacy Duverney, M.D.    Dg Chest Port 1 View  09/23/2012  *RADIOLOGY REPORT*  Clinical Data: Cough.  Fever.  PORTABLE CHEST - 1 VIEW  Comparison: 06/27/2011  Findings: Tortuous thoracic aorta noted.  Mild cardiomegaly noted with cardiothoracic index 60% on this AP radiograph.  Lungs appear clear.  Thoracic spondylosis noted.  No blunting of the lateral costophrenic angles.  IMPRESSION:  1.   Tortuous thoracic aorta with atherosclerotic calcification. 2.  Mild cardiomegaly, without edema. 3.  Thoracic spondylosis.   Original Report Authenticated By: Gaylyn Rong, M.D.     Microbiology: Recent Results (from the past 240 hour(s))  URINE CULTURE     Status: Normal   Collection Time   09/23/12 10:03 AM      Component Value Range Status Comment   Specimen Description URINE, CLEAN CATCH   Final    Special Requests NONE   Final    Culture  Setup Time 09/23/2012 16:20   Final    Colony Count >=100,000 COLONIES/ML   Final    Culture ESCHERICHIA COLI   Final    Report Status 09/25/2012 FINAL   Final    Organism ID, Bacteria ESCHERICHIA COLI   Final   CULTURE, BLOOD (ROUTINE X 2)     Status: Normal   Collection Time   09/23/12  5:35 PM      Component Value Range Status Comment   Specimen Description BLOOD RIGHT ARM   Final    Special Requests BOTTLES DRAWN AEROBIC AND ANAEROBIC 10CC   Final    Culture  Setup Time 09/23/2012 22:17   Final    Culture NO GROWTH 5 DAYS   Final    Report Status 09/29/2012 FINAL   Final   CULTURE, BLOOD (ROUTINE X 2)     Status: Normal   Collection Time   09/23/12  5:45 PM      Component Value Range Status Comment   Specimen Description BLOOD RIGHT HAND   Final    Special Requests BOTTLES DRAWN AEROBIC AND ANAEROBIC 10CC   Final    Culture  Setup Time 09/23/2012 22:17   Final    Culture NO GROWTH 5 DAYS   Final    Report Status 09/29/2012 FINAL   Final      Labs: Basic Metabolic Panel:  Lab 10/02/12 0981 10/01/12 1227 10/01/12 0636 09/30/12 0500 09/29/12 0700 09/28/12 0545  NA 140 145 148* 147* 145 --  K 3.7 3.5 3.4* 3.0* 3.6 --  CL 103 106 108 107 112 --  CO2 26 26 27 25 19  --  GLUCOSE 139* 259* 102* 135* 123* --  BUN 48* 53* 56* 61* 67* --  CREATININE 3.81* 4.27* 4.49* 5.29* 5.88* --  CALCIUM 9.2 9.5 9.3 9.3 9.1 --  MG -- -- -- -- -- --  PHOS 4.4 -- 5.1* 5.3* 5.9* 6.5*   Liver Function Tests:  Lab 10/02/12 1914  10/01/12 0636  09/30/12 0500 09/29/12 0700 09/28/12 0545  AST -- -- -- -- --  ALT -- -- -- -- --  ALKPHOS -- -- -- -- --  BILITOT -- -- -- -- --  PROT -- -- -- -- --  ALBUMIN 2.4* 2.4* 2.5* 2.2* 2.2*  Cardiac Enzymes:  Lab 09/26/12 0625  CKTOTAL 57  CKMB --  CKMBINDEX --  TROPONINI --       SignedConley Canal [phone]  [facility] 10/02/2012, 2:28 PM

## 2012-10-02 NOTE — Discharge Summary (Signed)
-   Continue antibiotics for a total of 10 days. - Creatinine trending down nicely we'll continue to hold diuretics and ARB another week we'll resume as an outpatient.

## 2012-10-06 NOTE — Discharge Summary (Signed)
-   agree with above, I have review the data above. - continue antibiotics for 7 days.

## 2012-10-07 NOTE — Consult Note (Signed)
I agree with the evaluation and treatment plan

## 2012-10-08 ENCOUNTER — Inpatient Hospital Stay: Payer: Medicare Other | Admitting: Family Medicine

## 2012-10-15 ENCOUNTER — Encounter: Payer: Self-pay | Admitting: Family Medicine

## 2012-10-21 ENCOUNTER — Encounter: Payer: Self-pay | Admitting: Family Medicine

## 2012-10-21 ENCOUNTER — Ambulatory Visit (INDEPENDENT_AMBULATORY_CARE_PROVIDER_SITE_OTHER): Payer: Medicare Other | Admitting: Family Medicine

## 2012-10-21 VITALS — BP 120/80 | HR 60 | Temp 97.7°F | Wt 147.0 lb

## 2012-10-21 DIAGNOSIS — Z87440 Personal history of urinary (tract) infections: Secondary | ICD-10-CM

## 2012-10-21 DIAGNOSIS — R319 Hematuria, unspecified: Secondary | ICD-10-CM

## 2012-10-21 LAB — POCT URINALYSIS DIPSTICK
Blood, UA: 250
Nitrite, UA: NEGATIVE
Protein, UA: 100
Spec Grav, UA: 1.02
Urobilinogen, UA: NEGATIVE
pH, UA: 5

## 2012-10-21 NOTE — Progress Notes (Signed)
  Subjective:    Patient ID: [name], female    DOB: 10-21-1923, 79 y.o.   MRN: [id number]  HPI She is here for followup visit after recent hospitalization. She did have pyelonephritis and also had elevated creatinine is likely secondary to dehydration. At this time she has had no fever, chills, abdominal or flank pain. No dysuria or frequency.   Review of Systems     Objective:   Physical Exam Alert and in no distress. Cardiac exam shows regular rhythm without murmurs or gallops. Lungs are clear to auscultation. Urine microscopic did show scattered red and white cells as well as renal tubular cells. The hospital record was reviewed      Assessment & Plan:  Blood in urine - Plan: POCT Urinalysis Dipstick, Urine culture  History of acute pyelonephritis  CKD (chronic kidney disease), stage III - Plan: Comprehensive metabolic panel I will await the culture results before potentially treated since she is really asymptomatic at this time and the urine microscopic was equivocal in terms of infection.

## 2012-10-22 ENCOUNTER — Other Ambulatory Visit: Payer: Self-pay

## 2012-10-22 LAB — COMPREHENSIVE METABOLIC PANEL
ALT: <8 U/L (ref 0–35)
BUN: 29 mg/dL — ABNORMAL HIGH (ref 6–23)
CO2: 24 mEq/L (ref 19–32)
Chloride: 105 mEq/L (ref 96–112)
Creat: 2.19 mg/dL — ABNORMAL HIGH (ref 0.50–1.10)
Glucose, Bld: 105 mg/dL — ABNORMAL HIGH (ref 70–99)
Total Bilirubin: 0.4 mg/dL (ref 0.3–1.2)

## 2012-10-22 NOTE — Progress Notes (Signed)
Quick Note:  CALLED PT INFORMED HER WORD FOR WORD   Kidney function is improving. Continue drinking fluids. Recheck in another 2 weeks  SHE VERBALIZED UNDERSTANDING ______

## 2012-11-04 ENCOUNTER — Other Ambulatory Visit: Payer: Medicare Other

## 2012-11-06 ENCOUNTER — Other Ambulatory Visit: Payer: Medicare Other

## 2012-11-06 DIAGNOSIS — N289 Disorder of kidney and ureter, unspecified: Secondary | ICD-10-CM

## 2012-11-07 LAB — COMPREHENSIVE METABOLIC PANEL
AST: 10 U/L (ref 0–37)
Alkaline Phosphatase: 108 U/L (ref 39–117)
BUN: 22 mg/dL (ref 6–23)
Calcium: 9.7 mg/dL (ref 8.4–10.5)
Creat: 1.79 mg/dL — ABNORMAL HIGH (ref 0.50–1.10)
Glucose, Bld: 125 mg/dL — ABNORMAL HIGH (ref 70–99)

## 2012-11-11 ENCOUNTER — Telehealth: Payer: Self-pay

## 2012-11-11 NOTE — Telephone Encounter (Signed)
I look at the labs but they got to leave before I could leave a message. Her potassium is slightly low but kidney function is improving. She can add a little low salt to her diet but not much. Probably should recheck this in several months

## 2012-11-11 NOTE — Telephone Encounter (Signed)
DR.LALONDE PT CALLED AND WANTED LAB RESULTS I LOOKED AT THEM IN HER CHART AND IT LOOKS LIKE YOU HAVE SEEN THEM EITHER PLEASE ADVISE

## 2012-11-11 NOTE — Telephone Encounter (Signed)
PT CALLED INFORMED OF LABS VERBALIZED UNDERSTANDING

## 2012-12-04 ENCOUNTER — Ambulatory Visit (INDEPENDENT_AMBULATORY_CARE_PROVIDER_SITE_OTHER): Payer: Medicare Other | Admitting: Family Medicine

## 2012-12-04 VITALS — BP 136/84 | HR 72 | Wt 149.0 lb

## 2012-12-04 DIAGNOSIS — R300 Dysuria: Secondary | ICD-10-CM

## 2012-12-04 DIAGNOSIS — N390 Urinary tract infection, site not specified: Secondary | ICD-10-CM

## 2012-12-04 LAB — POCT URINALYSIS DIPSTICK
Bilirubin, UA: NEGATIVE
Glucose, UA: NEGATIVE
Nitrite, UA: NEGATIVE

## 2012-12-04 MED ORDER — CIPROFLOXACIN HCL 500 MG PO TABS: 500.0000 mg | ORAL_TABLET | Freq: Two times a day (BID) | ORAL | Status: DC

## 2012-12-04 NOTE — Progress Notes (Signed)
  Subjective:    Patient ID: [name], female    DOB: 1923-11-11, 79 y.o.   MRN: [id number]  HPI She complains of a two-week history of dysuria, urgency, frequency but no fever, chills, back pain, nausea or vomiting. She has a previous history of pyelonephritis with treatment for Escherichia coli. She is drinking plenty of fluids.   Review of Systems     Objective:   Physical Exam Alert and in no distress. Abdominal exam shows no masses or tenderness. No CVA tenderness. Urine dipstick and microscopic were reviewed. The microscopic did show scattered white cells and rare red cells       Assessment & Plan:  Dysuria - Plan: Urinalysis Dipstick, ciprofloxacin (CIPRO) 500 MG tablet  UTI (lower urinary tract infection) - Plan: ciprofloxacin (CIPRO) 500 MG tablet, CULTURE, URINE COMPREHENSIVE she is to return here in 2 weeks for a recheck. If continued difficulty, urologic evaluation will be needed.

## 2012-12-06 LAB — CULTURE, URINE COMPREHENSIVE
Colony Count: NO GROWTH
Organism ID, Bacteria: NO GROWTH

## 2012-12-15 ENCOUNTER — Other Ambulatory Visit (HOSPITAL_COMMUNITY): Payer: Self-pay | Admitting: Cardiology

## 2012-12-15 DIAGNOSIS — I350 Nonrheumatic aortic (valve) stenosis: Secondary | ICD-10-CM

## 2012-12-18 ENCOUNTER — Encounter: Payer: Self-pay | Admitting: Family Medicine

## 2012-12-18 ENCOUNTER — Ambulatory Visit (INDEPENDENT_AMBULATORY_CARE_PROVIDER_SITE_OTHER): Payer: Medicare Other | Admitting: Family Medicine

## 2012-12-18 VITALS — BP 150/90 | HR 84 | Wt 150.0 lb

## 2012-12-18 DIAGNOSIS — N390 Urinary tract infection, site not specified: Secondary | ICD-10-CM

## 2012-12-18 MED ORDER — SULFAMETHOXAZOLE-TMP DS 800-160 MG PO TABS: 1.0000 | ORAL_TABLET | Freq: Two times a day (BID) | ORAL | Status: DC

## 2012-12-18 NOTE — Progress Notes (Signed)
  Subjective:    Patient ID: [name], female    DOB: 1924-07-04, 79 y.o.   MRN: [id number]  HPI She is here for recheck. She still complains of dysuria, urgency and lower abdominal pain. No fever, chills, nausea or vomiting. Review of her previous urinalysis and culture showed no growth.   Review of Systems     Objective:   Physical Exam Alert and in no distress. Abdominal exam shows no masses or tenderness. Urine microscopic showed contamination.       Assessment & Plan:  UTI (urinary tract infection) - Plan: sulfamethoxazole-trimethoprim (BACTRIM DS) 800-160 MG per tablet if she continues to have difficulty, she is to call me back for referral to urology.

## 2012-12-18 NOTE — Patient Instructions (Signed)
Take all the antibiotic and if you're still having any symptoms when you've finish, give me a call and I will refer you to urology

## 2012-12-22 ENCOUNTER — Ambulatory Visit: Payer: Medicare Other | Admitting: Neurosurgery

## 2012-12-22 ENCOUNTER — Other Ambulatory Visit (INDEPENDENT_AMBULATORY_CARE_PROVIDER_SITE_OTHER): Payer: Medicare Other | Admitting: *Deleted

## 2012-12-22 DIAGNOSIS — I6529 Occlusion and stenosis of unspecified carotid artery: Secondary | ICD-10-CM

## 2012-12-28 ENCOUNTER — Other Ambulatory Visit: Payer: Self-pay

## 2012-12-29 ENCOUNTER — Encounter: Payer: Self-pay | Admitting: Vascular Surgery

## 2012-12-30 ENCOUNTER — Telehealth: Payer: Self-pay | Admitting: Family Medicine

## 2012-12-30 ENCOUNTER — Other Ambulatory Visit: Payer: Self-pay

## 2012-12-30 DIAGNOSIS — R3989 Other symptoms and signs involving the genitourinary system: Secondary | ICD-10-CM

## 2012-12-30 NOTE — Telephone Encounter (Signed)
SENT IN EPIC TO UROLOGY

## 2012-12-30 NOTE — Telephone Encounter (Signed)
Faxed referral request over to urology

## 2012-12-30 NOTE — Telephone Encounter (Signed)
Give her a urology appointment

## 2013-01-08 ENCOUNTER — Ambulatory Visit (HOSPITAL_COMMUNITY): Payer: Medicare Other

## 2013-01-12 ENCOUNTER — Telehealth: Payer: Self-pay | Admitting: *Deleted

## 2013-01-12 MED ORDER — SIMVASTATIN 40 MG PO TABS: 40.0000 mg | ORAL_TABLET | Freq: Every day | ORAL | Status: DC

## 2013-01-12 NOTE — Telephone Encounter (Signed)
I am ok with her only taking clonidine at bedtime.  We can reassess her BP regimen when I see her in clinic.  Marykay Lex, MD

## 2013-01-12 NOTE — Telephone Encounter (Signed)
She can take 1/2 dose of simvastatin since she is on Diltiazem.  Marykay Lex, MD

## 2013-01-12 NOTE — Telephone Encounter (Signed)
Spoke to patient concerning labs- CMP,Lipids  from 12/22/12. Per Dr Herbie Baltimore lipids are elevated he would like for her to restart Simvastatin 40 mg at bedtime. Patient voiced understanding. Prescription will be sent to [facility]   Patient states she is only taking Diltiazem ER 60 mg daily,Clonidine 0.2 mg twice a day but state she only takes Clonidine in the evening because it makes her feel bad in the morning. She has not taken her blood pressure to check. She wanted Dr Herbie Baltimore to know

## 2013-01-14 ENCOUNTER — Other Ambulatory Visit: Payer: Self-pay | Admitting: *Deleted

## 2013-01-19 ENCOUNTER — Ambulatory Visit (HOSPITAL_COMMUNITY): Payer: Medicare Other

## 2013-01-19 ENCOUNTER — Telehealth: Payer: Self-pay | Admitting: Family Medicine

## 2013-01-20 ENCOUNTER — Encounter: Payer: Self-pay | Admitting: Cardiology

## 2013-01-20 ENCOUNTER — Ambulatory Visit (HOSPITAL_COMMUNITY)
Admission: RE | Admit: 2013-01-20 | Discharge: 2013-01-20 | Disposition: A | Discharge status: Home / Self Care | Payer: Medicare Other | Source: Ambulatory Visit | Attending: Cardiology | Admitting: Cardiology

## 2013-01-20 ENCOUNTER — Encounter: Payer: Self-pay | Admitting: Vascular Surgery

## 2013-01-20 DIAGNOSIS — I079 Rheumatic tricuspid valve disease, unspecified: Secondary | ICD-10-CM | POA: Insufficient documentation

## 2013-01-20 DIAGNOSIS — I379 Nonrheumatic pulmonary valve disorder, unspecified: Secondary | ICD-10-CM | POA: Insufficient documentation

## 2013-01-20 DIAGNOSIS — I350 Nonrheumatic aortic (valve) stenosis: Secondary | ICD-10-CM

## 2013-01-20 DIAGNOSIS — I059 Rheumatic mitral valve disease, unspecified: Secondary | ICD-10-CM | POA: Insufficient documentation

## 2013-01-20 DIAGNOSIS — I359 Nonrheumatic aortic valve disorder, unspecified: Secondary | ICD-10-CM | POA: Insufficient documentation

## 2013-01-20 DIAGNOSIS — E785 Hyperlipidemia, unspecified: Secondary | ICD-10-CM | POA: Insufficient documentation

## 2013-01-20 DIAGNOSIS — I517 Cardiomegaly: Secondary | ICD-10-CM | POA: Insufficient documentation

## 2013-01-20 DIAGNOSIS — I10 Essential (primary) hypertension: Secondary | ICD-10-CM | POA: Insufficient documentation

## 2013-01-20 DIAGNOSIS — I2510 Atherosclerotic heart disease of native coronary artery without angina pectoris: Secondary | ICD-10-CM | POA: Insufficient documentation

## 2013-01-20 NOTE — Telephone Encounter (Signed)
PT HAS APPT. June 13 AT 10:45 AND ARRIVE AT 10:30  PT IS AWARE

## 2013-01-20 NOTE — Progress Notes (Signed)
2D Echo Performed 01/20/2013    Stanford Strauch, RCS  

## 2013-01-20 NOTE — Telephone Encounter (Signed)
Spoke with patient. She is aware that Dr. Herbie Baltimore know about her use of Clonidine

## 2013-01-21 ENCOUNTER — Other Ambulatory Visit (HOSPITAL_COMMUNITY): Payer: Self-pay | Admitting: *Deleted

## 2013-01-28 ENCOUNTER — Encounter (HOSPITAL_COMMUNITY): Payer: Self-pay | Admitting: Emergency Medicine

## 2013-01-28 ENCOUNTER — Emergency Department (HOSPITAL_COMMUNITY)
Admission: EM | Admit: 2013-01-28 | Discharge: 2013-01-28 | Disposition: A | Discharge status: Home / Self Care | Payer: Medicare Other | Attending: Emergency Medicine | Admitting: Emergency Medicine

## 2013-01-28 DIAGNOSIS — Z862 Personal history of diseases of the blood and blood-forming organs and certain disorders involving the immune mechanism: Secondary | ICD-10-CM | POA: Insufficient documentation

## 2013-01-28 DIAGNOSIS — R809 Proteinuria, unspecified: Secondary | ICD-10-CM | POA: Insufficient documentation

## 2013-01-28 DIAGNOSIS — Z86718 Personal history of other venous thrombosis and embolism: Secondary | ICD-10-CM | POA: Insufficient documentation

## 2013-01-28 DIAGNOSIS — Z8673 Personal history of transient ischemic attack (TIA), and cerebral infarction without residual deficits: Secondary | ICD-10-CM | POA: Insufficient documentation

## 2013-01-28 DIAGNOSIS — I252 Old myocardial infarction: Secondary | ICD-10-CM | POA: Insufficient documentation

## 2013-01-28 DIAGNOSIS — N289 Disorder of kidney and ureter, unspecified: Secondary | ICD-10-CM

## 2013-01-28 DIAGNOSIS — M545 Low back pain, unspecified: Secondary | ICD-10-CM | POA: Insufficient documentation

## 2013-01-28 DIAGNOSIS — Z8739 Personal history of other diseases of the musculoskeletal system and connective tissue: Secondary | ICD-10-CM | POA: Insufficient documentation

## 2013-01-28 DIAGNOSIS — I2510 Atherosclerotic heart disease of native coronary artery without angina pectoris: Secondary | ICD-10-CM | POA: Insufficient documentation

## 2013-01-28 DIAGNOSIS — N189 Chronic kidney disease, unspecified: Secondary | ICD-10-CM | POA: Insufficient documentation

## 2013-01-28 DIAGNOSIS — Z8543 Personal history of malignant neoplasm of ovary: Secondary | ICD-10-CM | POA: Insufficient documentation

## 2013-01-28 DIAGNOSIS — I739 Peripheral vascular disease, unspecified: Secondary | ICD-10-CM | POA: Insufficient documentation

## 2013-01-28 DIAGNOSIS — R1032 Left lower quadrant pain: Secondary | ICD-10-CM | POA: Insufficient documentation

## 2013-01-28 DIAGNOSIS — Z7902 Long term (current) use of antithrombotics/antiplatelets: Secondary | ICD-10-CM | POA: Insufficient documentation

## 2013-01-28 DIAGNOSIS — I10 Essential (primary) hypertension: Secondary | ICD-10-CM | POA: Insufficient documentation

## 2013-01-28 DIAGNOSIS — Z8679 Personal history of other diseases of the circulatory system: Secondary | ICD-10-CM | POA: Insufficient documentation

## 2013-01-28 DIAGNOSIS — Z79899 Other long term (current) drug therapy: Secondary | ICD-10-CM | POA: Insufficient documentation

## 2013-01-28 DIAGNOSIS — R109 Unspecified abdominal pain: Secondary | ICD-10-CM

## 2013-01-28 DIAGNOSIS — R350 Frequency of micturition: Secondary | ICD-10-CM | POA: Insufficient documentation

## 2013-01-28 LAB — URINALYSIS, ROUTINE W REFLEX MICROSCOPIC
Bilirubin Urine: NEGATIVE
Ketones, ur: NEGATIVE mg/dL
Nitrite: NEGATIVE
Protein, ur: 100 mg/dL — AB
Urobilinogen, UA: 1 mg/dL (ref 0.0–1.0)
pH: 6 (ref 5.0–8.0)

## 2013-01-28 LAB — BASIC METABOLIC PANEL
BUN: 25 mg/dL — ABNORMAL HIGH (ref 6–23)
Chloride: 107 mEq/L (ref 96–112)
Creatinine, Ser: 1.54 mg/dL — ABNORMAL HIGH (ref 0.50–1.10)
GFR calc non Af Amer: 29 mL/min — ABNORMAL LOW (ref >90)
Glucose, Bld: 143 mg/dL — ABNORMAL HIGH (ref 70–99)
Potassium: 3.4 mEq/L — ABNORMAL LOW (ref 3.5–5.1)

## 2013-01-28 LAB — CBC WITH DIFFERENTIAL/PLATELET
Basophils Absolute: 0 10*3/uL (ref 0.0–0.1)
Basophils Relative: 0 % (ref 0–1)
Eosinophils Absolute: 0.1 10*3/uL (ref 0.0–0.7)
HCT: 34.2 % — ABNORMAL LOW (ref 36.0–46.0)
Hemoglobin: 11.4 g/dL — ABNORMAL LOW (ref 12.0–15.0)
MCH: 32.8 pg (ref 26.0–34.0)
MCHC: 33.3 g/dL (ref 30.0–36.0)
Monocytes Absolute: 0.6 10*3/uL (ref 0.1–1.0)
Monocytes Relative: 11 % (ref 3–12)
RDW: 13.2 % (ref 11.5–15.5)

## 2013-01-28 MED ORDER — FAMOTIDINE 20 MG PO TABS: 20.0000 mg | ORAL_TABLET | Freq: Two times a day (BID) | ORAL | Status: DC

## 2013-01-28 NOTE — ED Provider Notes (Addendum)
History     CSN: [id number]  Arrival date & time 01/28/13  0815   First MD Initiated Contact with Patient 01/28/13 [phone]      Chief Complaint  Patient presents with  . Abdominal Pain    (Consider location/radiation/quality/duration/timing/severity/associated sxs/prior treatment) HPI Comments: 79 year old female with a history of gynecologic cancer in the 1960s, chronic kidney disease with a poor creatinine and GFR, coronary disease. The patient was admitted to the hospital in January of this year with pyelonephritis and hydronephrosis of unknown etiology, was noted to have worsening renal failure, was treated with antibiotics, improved and was discharged home. Over the last 2 months she states that she has been having increased burning sensation which is worse at night, intermittent, not associated with fevers chills nausea or vomiting. She does have some urinary frequency. She has been scheduled for the urologist for 8 days from today. The symptoms have gradually worsened, are associated with lower back pain.  Review of the medical record shows that the patient had a CT scan in January of this year showing no signs of aneurysm of the abdominal aorta though it was heavily calcified. It also showed some hydronephrosis without definite etiology.  Patient is a 79 y.o. female presenting with abdominal pain. The history is provided by the patient.  Abdominal Pain Associated symptoms include abdominal pain.    Past Medical History  Diagnosis Date  . Peripheral vascular disease   . Coronary artery disease   . Heart murmur   . Chronic kidney disease 1964    renal artery left kidney stent  . Hypertension   . Stroke   . Myocardial infarction   . DVT (deep venous thrombosis)   . Aneurysm   . Anemia   . Cancer 1963    ovarian cancer  . Arthritis     Past Surgical History  Procedure Laterality Date  . Abdominal hysterectomy    . Bladder surgery      bladder tack  . Kidney surgery      right renal artery stent  . Coronary angioplasty with stent placement    . Leg stents    . Breast lumpectomy      left  . Appendectomy      Family History  Problem Relation Age of Onset  . Irritable bowel syndrome Daughter   . Colon cancer Neg Hx     History  Substance Use Topics  . Smoking status: Never Smoker   . Smokeless tobacco: Never Used  . Alcohol Use: No     Comment: social drinker  years ago-no longer    OB History   Grav Para Term Preterm Abortions TAB SAB Ect Mult Living                  Review of Systems  Gastrointestinal: Positive for abdominal pain.  All other systems reviewed and are negative.    Allergies  Codeine  Home Medications   Current Outpatient Rx  Name  Route  Sig  Dispense  Refill  . cloNIDine (CATAPRES) 0.2 MG tablet   Oral   Take 0.2 mg by mouth at bedtime.         . clopidogrel (PLAVIX) 75 MG tablet   Oral   Take 75 mg by mouth daily as needed (for blood thinner).          . hydroxypropyl methylcellulose (ISOPTO TEARS) 2.5 % ophthalmic solution   Both Eyes   Place 2 drops into both eyes 4 (four)  times daily as needed (for dry eyes).         . famotidine (PEPCID) 20 MG tablet   Oral   Take 1 tablet (20 mg total) by mouth 2 (two) times daily.   30 tablet   0     BP 157/103  Pulse 83  Temp(Src) 97.9 F (36.6 C) (Oral)  Resp 16  SpO2 100%  Physical Exam  Nursing note and vitals reviewed. Constitutional: She appears well-developed and well-nourished. No distress.  HENT:  Head: Normocephalic and atraumatic.  Mouth/Throat: Oropharynx is clear and moist. No oropharyngeal exudate.  Eyes: Conjunctivae and EOM are normal. Pupils are equal, round, and reactive to light. Right eye exhibits no discharge. Left eye exhibits no discharge. No scleral icterus.  Neck: Normal range of motion. Neck supple. No JVD present. No thyromegaly present.  Cardiovascular: Normal rate, regular rhythm, normal heart sounds and intact  distal pulses.  Exam reveals no gallop and no friction rub.   No murmur heard. Pulmonary/Chest: Effort normal and breath sounds normal. No respiratory distress. She has no wheezes. She has no rales.  Abdominal: Soft. Bowel sounds are normal. She exhibits no distension and no mass. There is tenderness ( Scant suprapubic and left lower quadrant tenderness, no guarding).  No pain at the right lower quadrant, right upper quadrant . No peritoneal signs.  Normal bowel sounds, no tympanitic sounds to percussion.  Musculoskeletal: Normal range of motion. She exhibits no edema and no tenderness.  No lower back tenderness over the lumbar spine or paraspinal muscles  Lymphadenopathy:    She has no cervical adenopathy.  Neurological: She is alert. Coordination normal.  Skin: Skin is warm and dry. No rash noted. No erythema.  Psychiatric: She has a normal mood and affect. Her behavior is normal.    ED Course  Procedures (including critical care time)  Labs Reviewed  URINALYSIS, ROUTINE W REFLEX MICROSCOPIC - Abnormal; Notable for the following:    Hgb urine dipstick SMALL (*)    Protein, ur 100 (*)    Leukocytes, UA SMALL (*)    All other components within normal limits  BASIC METABOLIC PANEL - Abnormal; Notable for the following:    Potassium 3.4 (*)    Glucose, Bld 143 (*)    BUN 25 (*)    Creatinine, Ser 1.54 (*)    GFR calc non Af Amer 29 (*)    GFR calc Af Amer 34 (*)    All other components within normal limits  CBC WITH DIFFERENTIAL - Abnormal; Notable for the following:    RBC 3.48 (*)    Hemoglobin 11.4 (*)    HCT 34.2 (*)    All other components within normal limits  URINE MICROSCOPIC-ADD ON - Abnormal; Notable for the following:    Casts GRANULAR CAST (*)    All other components within normal limits   No results found.   1. Abdominal pain   2. Proteinuria   3. Renal insufficiency       MDM  The patient is able to straight leg raise bilaterally, her back pain is  unlikely to be related to spinal cord problem. Her abdominal pain, described as burning and worse at night may be related to urinary source but would also consider GERD or gastritis. She does not take any medications for acid reflux, she has been taken off lisinopril and currently only takes clonidine and Plavix. Vital signs at this time appear normal, she has a nonsurgical abdomen at this time,  labs ordered to evaluate for urinary infection. She has no pain over McBurney's point or the right upper quadrant, no jaundice.  Pt has minimal c/o at this time, has no significantly abnormal VS and has no leukocytosis, mild CRI and mild proteinuria and normal UA - pt will f/u with PMD.  Start Pepcid, doubt other more significant cause.  Pt has been ambulatory without any difficulty during ED stay.  Meds given in ED:  Medications - No data to display  New Prescriptions   FAMOTIDINE (PEPCID) 20 MG TABLET    Take 1 tablet (20 mg total) by mouth 2 (two) times daily.          Vida Roller, MD 01/28/13 1610  Vida Roller, MD 01/28/13 [phone]

## 2013-01-28 NOTE — ED Notes (Signed)
MD at bedside. 

## 2013-01-28 NOTE — ED Notes (Signed)
"  Burning" abdominal pain for 8 weeks.  + lower abdominal pain.  Pain worse at night.

## 2013-01-29 ENCOUNTER — Encounter: Payer: Self-pay | Admitting: Cardiology

## 2013-01-29 ENCOUNTER — Ambulatory Visit (INDEPENDENT_AMBULATORY_CARE_PROVIDER_SITE_OTHER): Payer: Medicare Other | Admitting: Cardiology

## 2013-01-29 VITALS — BP 150/80 | HR 88 | Ht 63.0 in | Wt 147.0 lb

## 2013-01-29 DIAGNOSIS — E785 Hyperlipidemia, unspecified: Secondary | ICD-10-CM

## 2013-01-29 DIAGNOSIS — I359 Nonrheumatic aortic valve disorder, unspecified: Secondary | ICD-10-CM

## 2013-01-29 DIAGNOSIS — I10 Essential (primary) hypertension: Secondary | ICD-10-CM

## 2013-01-29 DIAGNOSIS — I350 Nonrheumatic aortic (valve) stenosis: Secondary | ICD-10-CM

## 2013-01-29 DIAGNOSIS — I739 Peripheral vascular disease, unspecified: Secondary | ICD-10-CM

## 2013-01-29 DIAGNOSIS — I219 Acute myocardial infarction, unspecified: Secondary | ICD-10-CM

## 2013-01-29 DIAGNOSIS — I2510 Atherosclerotic heart disease of native coronary artery without angina pectoris: Secondary | ICD-10-CM

## 2013-01-29 DIAGNOSIS — I63239 Cerebral infarction due to unspecified occlusion or stenosis of unspecified carotid arteries: Secondary | ICD-10-CM

## 2013-01-29 MED ORDER — CARVEDILOL 3.125 MG PO TABS: 3.1250 mg | ORAL_TABLET | Freq: Two times a day (BID) | ORAL | Status: DC

## 2013-01-29 MED ORDER — ASPIRIN EC 81 MG PO TBEC: 81.0000 mg | DELAYED_RELEASE_TABLET | Freq: Every day | ORAL | Status: DC

## 2013-01-29 NOTE — Patient Instructions (Addendum)
Your physician wants you to follow-up in 6 months with extender and 12 months with Dr Herbie Baltimore.   You will receive a reminder letter in the mail two months in advance. If you don't receive a letter, please call our office to schedule the follow-up appointment.  Your physician has recommended you make the following change in your medication start Coreg 3.125 mg one tablet twice a day.  start Aspirin 81 mg  One tablet daily.   Your physician has requested that you have a lower extremity arterial doppler

## 2013-01-29 NOTE — Progress Notes (Signed)
Patient ID: [name], female   DOB: 01/27/1924, 79 y.o.   MRN: [id number]    Clinic Note: HPI: [name] is a 79 y.o. female with an extensive past medical history of coronary artery and peripheral vascular as well as carotid artery disease as outlined below.  She presents today for routine followup on her echocardiogram that demonstrated worsening aortic stenosis with actually some mild mitral stenosis as well. (Please see description below)   She was last seen in April of this year following a hospitalization for stomach pallor nephritis. Skin multiple medications changed on seeing her back as yet she is doing overall. She's only taking each bedtime Catapres and eyedrops. She stopped all her other medications during hospitalization.  Interval History: Since her last seen her she sees her doing pretty well overall. She is mostly concerned with a strained sensation of burning that she has with urination. She's been to the ER again all on external sensation. She is posted 1 CEA urologist or oncologist for this. When this flares up she feels a little unsteady in the woozy but otherwise denies any syncopal or near syncopal type symptoms. She is now starting it back active. She notes getting short of breath with exertion but is only mild. She has not really been doing that much activity over the last several months and she attributes this to being out of shape. She says if she her heart rate gets up with stress feels a little short of breath. She denies any chest pressure chest tightness was restoration. He denies any PND, orthopnea and only minute minimal edema. She denies any palpitations, syncope or near syncope symptoms. No recurrence of her stroke symptoms in her TIA/amaurosis fugax symptoms.   She does note mild claudication target held whether it's right-sided left-sided she does that her legs hurt her when she walks but she also notes her balance issue. This does not stimulus the  limiting at this time.  Despite her dysuria, she denies any hematuria. She also denies any melena or hematochezia.  Past Medical History  Diagnosis Date  . Peripheral vascular disease 2010    Known distal left SFA occlusion, known right posterior tibial artery occlusion as well as left posterior and anterior tibial artery occlusion.; Most recent Dopplers November 2012: Patent right SFA stent, left ABI 0.56  . Coronary artery disease - history of MI October 2006    Severe RCA lesion -- 2.5 Mm x 12 Mm Vision BMS; January 2007 -. Distal stent stenosis in RCA treated with 2.5 mm x y 12 mm Vision BMS stent distally;  circumflex/OM stenosis -- 2.5 mg 12 mm Mini Vision BMS ;  most recent cath October 11: 100% RCA occlusion with ISR. Left right collaterals. Patent to the extent, the 70% AV groove circumflex. Apical LAD stenosis 70-80 %, diffuse disease; EF 65-70%   . Aortic valve stenosis, senile calcific January 2011    Echo: Normal EF 60-65%, mild aortic stenosis - estimated valve area 1.1 cm -- recently followed up with new echocardiogram  . Chronic kidney disease 1964  . Hypertension     Previously on multiple medications now on only clonidine  . Stroke     Carotid artery Doppler show right internal carotid 60-80% stenosis. Previously followed by Dr. Alton Revere  . Non-ST elevation MI (NSTEMI) 2006, 2011  . DVT (deep venous thrombosis)   . Aneurysm   . Anemia   . Cancer 1963    ovarian cancer  . Arthritis   .  Bilateral renal artery stenosis 2006 Orson Slick)    - Right renal artery 60% stenosis March '13  . Dyslipidemia, goal LDL below 70      no longer on statin  . Cerebral aneurysm without rupture     3 mm; followed by Dr. Lovell Sheehan as well as vascular surgery    Most Recent Cardiac Evaluation:  Echocardiogram 01/20/2013:   Severe concentric hypertrophy. EF 65-70%. Grade 1 diastolic dysfunction but elevated but the left atrial pressures.  Aortic valve: Heavily calcified and restricted motion.  Moderate stenosis with trivial regurgitation.   Valve area estimated 1.33 cm.  Peak and mean gradients: 32 mmHg and 23 mmHg  Moderately calcified mitral valve annulus. Mild mitral stenosis.  Peak and mean pressure gradients: 13 mmHg and 4 mmHg  Normal left atrial size.  Peak PA pressures: 45 mmHg   Carotid Dopplers 12/22/12: Right internal carotid: 60-79% stenosis, left internal carotid < 40%  Nuclear stress test February 2011: No evidence of ischemia. EF 73%.   Past Surgical History  Procedure Laterality Date  . Abdominal hysterectomy    . Bladder surgery      bladder tack  . Coronary angioplasty with stent placement  2006    RCA - MiniVision BMS 2.5 mm x 12 mm  . Coronary angioplasty with stent placement Bilateral 2007    Circumflex-OM -- 2.5 mm x 12 mm Mini Vision BMS; RCA distal to previous stent 2.5 mm x 12 mm Mini Vision BMS  . Breast lumpectomy      left  . Appendectomy    . Femoral artery stent Right 2006, 2011, 2012    Atherectomy with PTA alone in 2006; April 2000: Right SFA - overlapping stents (6.0 mm x 170 mm x2 and 6.0 mm x 40 mm); [place] Balloon PTA of right SFA ISR -- patent by Dopplers and cath in 2012  . Renal artery stent Bilateral 2006    Dr. Orson Slick; patent as of 2012 cath  . Iliac artery stent Right 2006  . Popliteal artery angioplasty  November 2012    Done in the same setting as PTA of ISR in right SFA    Allergies  Allergen Reactions  . Codeine Rash    Current Outpatient Prescriptions  Medication Sig Dispense Refill  . cloNIDine (CATAPRES) 0.2 MG tablet Take 0.2 mg by mouth at bedtime.      . hydroxypropyl methylcellulose (ISOPTO TEARS) 2.5 % ophthalmic solution Place 2 drops into both eyes 4 (four) times daily as needed (for dry eyes).      Marland Kitchen aspirin EC 81 MG tablet Take 1 tablet (81 mg total) by mouth daily.  64 tablet  0  . carvedilol (COREG) 3.125 MG tablet Take 1 tablet (3.125 mg total) by mouth 2 (two) times daily.  60 tablet  11    No current facility-administered medications for this visit.  ** Coreg and aspirin restarted today not current medications. Was previously on 12.5 twice Coreg and 81 mg aspirin a day prior to her hospitalization.  Social History:  She is a widowed mother of 5. She has 32 grandchildren and great-grandchildren combined. She is very active. She reports that she has never smoked. She has never used smokeless tobacco. She reports that she does not drink alcohol or use illicit drugs.  She has not had a drink in over 30 years.  ROS: A comprehensive Review of Systems - Negative except Pertinent positives as above and other causes below her noncardiac Gastrointestinal ROS: positive for -  gas/bloating and heartburn negative for - hematemesis, nausea/vomiting or swallowing difficulty/pain Genito-Urinary ROS: positive for - dysuria, urinary frequency/urgency and vulvar/vaginal symptoms negative for - change in urinary stream, hematuria or pelvic pain Neurological ROS: no TIA or stroke symptoms positive for - gait disturbance and impaired coordination/balance  PHYSICAL EXAM BP 150/80  Pulse 88  Ht 5\' 3"  (1.6 m)  Wt 147 lb (66.679 kg)  BMI 26.05 kg/m2 General appearance: alert, cooperative, appears stated age, no distress and Very pleasant elderly African American woman. Answers questions appropriately. Well-nourished and well-groomed. Neck: no adenopathy, no JVD, supple, symmetrical, trachea midline, thyroid not enlarged, symmetric, no tenderness/mass/nodules and Faint right-sided carotid bruit Lungs: clear to auscultation bilaterally, normal percussion bilaterally and Normal effort, good air movement. Heart: regular rate and rhythm, S1, S2 normal, no S3 or S4, systolic murmur: systolic ejection 3/6, crescendo, decrescendo and harsh at 2nd right intercostal space, radiates to carotids and no rub Abdomen: soft, non-tender; bowel sounds normal; no masses,  no organomegaly and Soft bruit noted in the  midline , and more to the right; no femoral bruit noted Extremities: extremities normal, atraumatic, no cyanosis or edema, no edema, redness or tenderness in the calves or thighs and no ulcers, gangrene or trophic changes Pulses: Diminished pedal pulses mostly on the left barely palpable. Posterior tables of the right are bounding the week DP pulses in the right. Bounding radial pulses bilaterally. Neurologic: Mental status: Alert, oriented, thought content appropriate Cranial nerves: normal HEENT: Mild arcus senilis, [place]/AP, EOMI, MMM, anicteric sclera and noninjected conjunctiva.  AVW:[id number] today: No  ASSESSMENT: Relatively stable from a cardiac standpoint. Not symptomatic from her coronary disease or aortic valve disease, minimal claudication symptoms. Her blood pressures relatively elevated today. We'll need to up restart some of her previous medication.  PAD (peripheral artery disease) - Plan: Lower Extremity Arterial Duplex Bilateral  Peripheral vascular disease  Coronary artery disease  Aortic valve stenosis, senile calcific  Hypertension  Myocardial infarction  Dyslipidemia, goal LDL below 70  Occlusion and stenosis of carotid artery with history of cerebral infarction -- recently checked, followed by vascular surgery  PLAN: Per problem list. Orders Placed This Encounter  Procedures  . Lower Extremity Arterial Duplex Bilateral    Standing Status: Future     Number of Occurrences:      Standing Expiration Date: 01/29/2014    Order Specific Question:  Laterality    Answer:  Bilateral    Order Specific Question:  Where should this test be performed:    Answer:  [facility]   Meds ordered this encounter  Medications  . carvedilol (COREG) 3.125 MG tablet    Sig: Take 1 tablet (3.125 mg total) by mouth 2 (two) times daily.    Dispense:  60 tablet    Refill:  11  . aspirin EC 81 MG tablet    Sig: Take 1 tablet (81 mg total) by mouth daily.    Dispense:  64  tablet    Refill:  0    Followup:  PA/NP (APP) in 6 months, M.D. in 12 months  Domanik Rainville W, M.D., M.S. [facility] [address] [place], [place]  [address]  [phone] Pager # [phone] 01/30/2013 1:49 PM

## 2013-01-30 ENCOUNTER — Encounter: Payer: Self-pay | Admitting: Cardiology

## 2013-01-30 DIAGNOSIS — I252 Old myocardial infarction: Secondary | ICD-10-CM | POA: Insufficient documentation

## 2013-01-30 DIAGNOSIS — E785 Hyperlipidemia, unspecified: Secondary | ICD-10-CM | POA: Insufficient documentation

## 2013-01-30 DIAGNOSIS — I739 Peripheral vascular disease, unspecified: Secondary | ICD-10-CM | POA: Insufficient documentation

## 2013-01-30 NOTE — Assessment & Plan Note (Signed)
Elevated today. Will restart carvedilol at 3.25 mm twice a day. Anticipate titrating this up.

## 2013-01-30 NOTE — Assessment & Plan Note (Signed)
No active symptoms. We'll need to recheck prior to her 1 year followup visit. This can be ordered at the time of her 6 month followup with the PA/NP.

## 2013-01-30 NOTE — Assessment & Plan Note (Addendum)
Currently stable without any significant anginal symptoms. She does have mild dyspnea on exertion only. She states that is probably because she's out of shape.  She's off almost all her medications. I want to get her back on Coreg based people 25 twice a day and restart aspirin 81 mg daily. When she returns, we'll need to recheck her lipids in order to ensure that she is appropriately on a statin if need be.

## 2013-01-30 NOTE — Assessment & Plan Note (Addendum)
Not currently on a statin do to how sick she was in hospital. I would like to give her some time to recover before we recheck her lipids to be reevaluated when she is seen back in 6 month followup. She is a primary before then, the perhaps they could check the labs. Otherwise, I will actually contact her to have these labs drawn before she comes back in 6 months by the PA/NP.

## 2013-01-30 NOTE — Assessment & Plan Note (Signed)
Recently checked. Followed by vascular surgery.

## 2013-01-30 NOTE — Assessment & Plan Note (Addendum)
Mild symptoms of claudication, and she clearly has vascular disease. She is due for lower STEMI Dopplers. Without her having lysed the limiting claudication, however I would be reluctant to pursue any peripheral intervention unless there is high-grade lesions in the right that were new. She should also get renal artery Dopplers rechecked -- that I will add to the order. She has recently had her carotids checked with the vascular surgeons.

## 2013-01-31 ENCOUNTER — Encounter: Payer: Self-pay | Admitting: *Deleted

## 2013-03-25 ENCOUNTER — Telehealth (HOSPITAL_COMMUNITY): Payer: Self-pay | Admitting: Cardiology

## 2013-03-31 ENCOUNTER — Encounter (HOSPITAL_COMMUNITY): Payer: Medicare Other

## 2013-04-27 ENCOUNTER — Encounter (HOSPITAL_COMMUNITY): Payer: Medicare Other

## 2013-05-04 ENCOUNTER — Ambulatory Visit (INDEPENDENT_AMBULATORY_CARE_PROVIDER_SITE_OTHER): Payer: Medicare Other | Admitting: Family Medicine

## 2013-05-04 ENCOUNTER — Ambulatory Visit: Payer: Self-pay | Admitting: Family Medicine

## 2013-05-04 ENCOUNTER — Encounter: Payer: Self-pay | Admitting: Family Medicine

## 2013-05-04 VITALS — BP 150/90 | Wt 153.0 lb

## 2013-05-04 DIAGNOSIS — E785 Hyperlipidemia, unspecified: Secondary | ICD-10-CM

## 2013-05-04 DIAGNOSIS — I2510 Atherosclerotic heart disease of native coronary artery without angina pectoris: Secondary | ICD-10-CM

## 2013-05-04 DIAGNOSIS — R42 Dizziness and giddiness: Secondary | ICD-10-CM

## 2013-05-04 DIAGNOSIS — R51 Headache: Secondary | ICD-10-CM

## 2013-05-04 LAB — COMPREHENSIVE METABOLIC PANEL
ALT: 8 U/L (ref 0–35)
BUN: 21 mg/dL (ref 6–23)
CO2: 26 mEq/L (ref 19–32)
Calcium: 10.1 mg/dL (ref 8.4–10.5)
Chloride: 107 mEq/L (ref 96–112)
Creat: 1.47 mg/dL — ABNORMAL HIGH (ref 0.50–1.10)
Glucose, Bld: 101 mg/dL — ABNORMAL HIGH (ref 70–99)

## 2013-05-04 LAB — CBC WITH DIFFERENTIAL/PLATELET
Eosinophils Relative: 4 % (ref 0–5)
HCT: 34.4 % — ABNORMAL LOW (ref 36.0–46.0)
Hemoglobin: 11.4 g/dL — ABNORMAL LOW (ref 12.0–15.0)
Lymphocytes Relative: 26 % (ref 12–46)
Lymphs Abs: 1.5 10*3/uL (ref 0.7–4.0)
MCV: 97.2 fL (ref 78.0–100.0)
Monocytes Absolute: 0.8 10*3/uL (ref 0.1–1.0)
Monocytes Relative: 13 % — ABNORMAL HIGH (ref 3–12)
RBC: 3.54 MIL/uL — ABNORMAL LOW (ref 3.87–5.11)
WBC: 5.8 10*3/uL (ref 4.0–10.5)

## 2013-05-04 LAB — LIPID PANEL
Cholesterol: 257 mg/dL — ABNORMAL HIGH (ref 0–200)
Total CHOL/HDL Ratio: 5.6 Ratio

## 2013-05-04 NOTE — Progress Notes (Signed)
  Subjective:    Patient ID: [name], female    DOB: 1924-08-11, 79 y.o.   MRN: [id number]  HPI She is here for consultation concerning difficulty with intermittent dizziness for the last month. She can be lying in bed in note the dizziness as well as some slight blurring of her vision. This usually lasts a short period of time. She cannot relate this to any head position. She also is had increasing difficulty with left occipital headache. She also mentioned visual hallucinations thinking that she is saying things that really weren't there. She states that on her last admission she was told she had an aneurysm however review of the record shows no evidence of this. She does have a history of carotid stenosis as well as claudication and PVD. She is seen by cardiology.   Review of Systems     Objective:   Physical Exam alert and in no distress. EOMI. Other cranial nerves grossly intact. Cerebellar testing normal. DTRs normal. Normal strength. Tympanic membranes and canals are normal. Throat is clear. Tonsils are normal. Neck is supple without adenopathy or thyromegaly. Cardiac exam shows a regular sinus rhythm with a 2/6 SEM no gallops. Lungs are clear to auscultation.        Assessment & Plan:  Headache - Plan: CBC with Differential, Comprehensive metabolic panel, MR Brain Wo Contrast, CANCELED: MR Brain W Wo Contrast  Dizziness - Plan: CBC with Differential, Comprehensive metabolic panel, MR Brain Wo Contrast, CANCELED: MR Brain W Wo Contrast  Coronary artery disease - Plan: Lipid panel, MR Brain Wo Contrast  Dyslipidemia, goal LDL below 70 - Plan: Lipid panel  is very difficult to get a good history from her however the dizziness and the worsening left occipital headache I think require further evaluation.

## 2013-05-05 ENCOUNTER — Telehealth: Payer: Self-pay

## 2013-05-05 MED ORDER — ATORVASTATIN CALCIUM 10 MG PO TABS: 10.0000 mg | ORAL_TABLET | Freq: Every day | ORAL | Status: DC

## 2013-05-05 NOTE — Progress Notes (Signed)
Quick Note:  CALLED PT TO LET HER KNOW Blood work is essentially unchanged PT VERBALIZED UNDERSTANDING ______

## 2013-05-05 NOTE — Telephone Encounter (Signed)
PT INFORMED

## 2013-05-05 NOTE — Telephone Encounter (Signed)
Let her know that I called it in 

## 2013-05-05 NOTE — Telephone Encounter (Signed)
PATIENT WANTED TO KNOW IF YOU WOULD CALL IN REFILLS AND IF YOU WANTED HER BACK ON CHOLESTEROL MED

## 2013-05-05 NOTE — Telephone Encounter (Signed)
PATIENT WANTED TO KNOW IF YOU WOULD CALL HER IN REFILLS AND IF YOU WANTED HER TO GO BACK ON CHOLESTEROL MED SHE STATES SHE HAS BEEN OFF ALL HER MEDS FOR OVER A MONTH PLEASE ADVISE

## 2013-05-11 ENCOUNTER — Ambulatory Visit
Admission: RE | Admit: 2013-05-11 | Discharge: 2013-05-11 | Disposition: A | Discharge status: Home / Self Care | Payer: Medicare Other | Source: Ambulatory Visit | Attending: Family Medicine | Admitting: Family Medicine

## 2013-05-11 DIAGNOSIS — R519 Headache, unspecified: Secondary | ICD-10-CM

## 2013-05-11 DIAGNOSIS — R42 Dizziness and giddiness: Secondary | ICD-10-CM

## 2013-05-11 DIAGNOSIS — I2510 Atherosclerotic heart disease of native coronary artery without angina pectoris: Secondary | ICD-10-CM

## 2013-05-12 ENCOUNTER — Telehealth: Payer: Self-pay | Admitting: Internal Medicine

## 2013-05-12 NOTE — Telephone Encounter (Signed)
Pt has called stating that she could not get the MRI done yesterday due to having stents and needing info about the stents. Pt has stents in her leg,kidney and heart.  i have called Dr. Onalee Hua harding office at [facility] at [phone] and they are suppose to fax over info to [facility]. And pt states that a Dr. Pablo Ledger @ [phone] now works in [place] and he did her kidney and leg stents.

## 2013-05-12 NOTE — Telephone Encounter (Signed)
Pt gave me the wrong number to Dr. Guinevere Scarlet office. Doctor is retired and so i only have a few ov notes from him that i will fax over to [facility] on what i have, but Dr. Guinevere Scarlet records is unable to be located.  Pt was notified that i could not find any info and i will send what notes i have, and if she had any info on the manufacture of the stent and serial # to give them [facility]

## 2013-05-13 ENCOUNTER — Ambulatory Visit: Payer: Medicare Other | Admitting: Family Medicine

## 2013-05-16 ENCOUNTER — Ambulatory Visit
Admission: RE | Admit: 2013-05-16 | Discharge: 2013-05-16 | Disposition: A | Discharge status: Home / Self Care | Payer: Medicare Other | Source: Ambulatory Visit | Attending: Family Medicine | Admitting: Family Medicine

## 2013-05-17 ENCOUNTER — Other Ambulatory Visit: Payer: Medicare Other

## 2013-05-17 NOTE — Progress Notes (Signed)
Quick Note:  Pt advised and verbalized understand ______

## 2013-05-24 ENCOUNTER — Encounter: Payer: Self-pay | Admitting: Cardiology

## 2013-05-26 ENCOUNTER — Encounter (HOSPITAL_COMMUNITY): Payer: Medicare Other

## 2013-06-03 ENCOUNTER — Ambulatory Visit (HOSPITAL_COMMUNITY)
Admission: RE | Admit: 2013-06-03 | Discharge: 2013-06-03 | Disposition: A | Discharge status: Home / Self Care | Payer: Medicare Other | Source: Ambulatory Visit | Attending: Cardiovascular Disease | Admitting: Cardiovascular Disease

## 2013-06-03 DIAGNOSIS — I739 Peripheral vascular disease, unspecified: Secondary | ICD-10-CM

## 2013-06-03 DIAGNOSIS — I70219 Atherosclerosis of native arteries of extremities with intermittent claudication, unspecified extremity: Secondary | ICD-10-CM | POA: Insufficient documentation

## 2013-06-03 NOTE — Progress Notes (Signed)
Arterial Lower Ext. Duplex Completed. Thierry Dobosz, BS, RDMS, RVT  

## 2013-06-06 NOTE — Progress Notes (Signed)
Quick Note:  Note sent to [facility] to schedule. ______

## 2013-06-16 ENCOUNTER — Encounter: Payer: Self-pay | Admitting: Cardiovascular Disease

## 2013-06-16 ENCOUNTER — Ambulatory Visit (INDEPENDENT_AMBULATORY_CARE_PROVIDER_SITE_OTHER): Payer: Medicare Other | Admitting: Cardiovascular Disease

## 2013-06-16 VITALS — BP 150/92 | HR 72 | Ht 64.0 in | Wt 150.8 lb

## 2013-06-16 DIAGNOSIS — I739 Peripheral vascular disease, unspecified: Secondary | ICD-10-CM

## 2013-06-16 NOTE — Progress Notes (Signed)
06/16/2013 KORALEE WEDEKING   Nov 17, 1923  [id number]  Primary Physician Carollee Herter, MD Primary Cardiologist: Runell Gess MD Roseanne Reno   HPI:  Ms. Ruland is a 79 year old mildly overweight widowed African American  female, mother of 5 living children and grandmother of 15 grandchildren  who I saw in the office on June 27, 2011. She has a history of CAD  status post multiple interventions in the past. She has also had a  history of PVOD status post bilateral renal artery PTA and stenting in  2006 as well as right SFA intervention by Dr. Yates Decamp 2-1/2 years ago.  The patient has hypertension, hyperlipidemia as well. She denies chest  pain or shortness of breath. She also has mild aortic stenosis with  valve area of 1.1 cm squared 2 years ago. She has complained of right  lower extremity claudication in the left. Dopplers revealed right ABI  of 0.75, left of 0.6 with high-frequency signals in the distal right  SFA. Performed re\re intervention of her right SFA 09/02/10. Recent Doppler suggests in-stent restenosis.   Current Outpatient Prescriptions  Medication Sig Dispense Refill  . acetaminophen (TYLENOL) 500 MG tablet Take 500 mg by mouth as needed for pain.      Marland Kitchen aspirin 81 MG EC tablet Take 81 mg by mouth every other day. Swallow whole.      Marland Kitchen atorvastatin (LIPITOR) 10 MG tablet Take 10 mg by mouth every other day.      . carvedilol (COREG) 3.125 MG tablet Take 3.125 mg by mouth daily.      Marland Kitchen Propylene Glycol-Glycerin (SOOTHE) 0.6-0.6 % SOLN Apply 1 drop to eye daily.       No current facility-administered medications for this visit.    Allergies  Allergen Reactions  . Codeine Rash    History   Social History  . Marital Status: Widowed    Spouse Name: N/A    Number of Children: 6  . Years of Education: N/A   Occupational History  . Not on file.   Social History Main Topics  . Smoking status: Never Smoker   . Smokeless  tobacco: Never Used  . Alcohol Use: No     Comment: social drinker  years ago-no longer  . Drug Use: No  . Sexual Activity: Not on file   Other Topics Concern  . Not on file   Social History Narrative   She is a widowed mother of 5. She has 32 grandchildren and great-grandchildren   combined. She is very active. Never smoked. She has not had a drink in over 30 years.     Review of Systems: General: negative for chills, fever, night sweats or weight changes.  Cardiovascular: negative for chest pain, dyspnea on exertion, edema, orthopnea, palpitations, paroxysmal nocturnal dyspnea or shortness of breath Dermatological: negative for rash Respiratory: negative for cough or wheezing Urologic: negative for hematuria Abdominal: negative for nausea, vomiting, diarrhea, bright red blood per rectum, melena, or hematemesis Neurologic: negative for visual changes, syncope, or dizziness All other systems reviewed and are otherwise negative except as noted above.    Blood pressure 150/92, pulse 72, height 5\' 4"  (1.626 m), weight 150 lb 12.8 oz (68.402 kg).  General appearance: alert and no distress Neck: no adenopathy, no JVD, supple, symmetrical, trachea midline, thyroid not enlarged, symmetric, no tenderness/mass/nodules and ssoft bilateral carotid bruits Lungs: clear to auscultation bilaterally Heart: regular rate and rhythm, S1, S2 normal, no murmur, click, rub or  gallop Extremities: extremities normal, atraumatic, no cyanosis or edema  EKG not performed today  ASSESSMENT AND PLAN:   Peripheral vascular disease Ms. Dice has had bilateral renal artery stenting as well as right SFA stenting by Dr.Gangi  In 2010.because of "in-stent restenosis I study per 09/02/10 and perform intervention on her right SFA. She has a known occluded left SFA with one vessel runoff bilaterally. At that time she had patent renal artery stents. Recent lower extremity arterial Doppler studies performed 06/03/13  suggested "in-stent restenosis" within her previously placed stents versus another disease distal to that. 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 She really denies lifestyle limiting claudication. At this point I do not see an indication to perform intervention but rather with prefer to follow her noninvasively and clinically      Runell Gess MD [facility], [facility] 06/16/2013 12:06 PM

## 2013-06-16 NOTE — Patient Instructions (Signed)
Follow up with Dr Herbie Baltimore in December 2014.  Dr Allyson Sabal will see you back as needed.

## 2013-06-16 NOTE — Assessment & Plan Note (Signed)
Ms. Dugo has had bilateral renal artery stenting as well as right SFA stenting by Dr.Gangi  In 2010.because of "in-stent restenosis I study per 09/02/10 and perform intervention on her right SFA. She has a known occluded left SFA with one vessel runoff bilaterally. At that time she had patent renal artery stents. Recent lower extremity arterial Doppler studies performed 06/03/13 suggested "in-stent restenosis" within her previously placed stents versus another disease distal to that. 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 She really denies lifestyle limiting claudication. At this point I do not see an indication to perform intervention but rather with prefer to follow her noninvasively and clinically

## 2013-07-14 ENCOUNTER — Ambulatory Visit (INDEPENDENT_AMBULATORY_CARE_PROVIDER_SITE_OTHER): Payer: Medicare Other | Admitting: Family Medicine

## 2013-07-14 ENCOUNTER — Encounter: Payer: Self-pay | Admitting: Family Medicine

## 2013-07-14 VITALS — BP 138/88 | HR 80 | Temp 98.1°F | Ht 64.0 in | Wt 149.0 lb

## 2013-07-14 DIAGNOSIS — R51 Headache: Secondary | ICD-10-CM

## 2013-07-14 DIAGNOSIS — Z23 Encounter for immunization: Secondary | ICD-10-CM

## 2013-07-14 DIAGNOSIS — E7800 Pure hypercholesterolemia, unspecified: Secondary | ICD-10-CM

## 2013-07-14 NOTE — Patient Instructions (Signed)
We are doing bloodwork today looking for a serious cause for your scalp pain.  If normal, I suspect it is likely musculoskeletal, perhaps related to your sleep position.  Try to avoid using too many pillows.  You can try heat or cool compresses when you have the pain.  Feel free to use tylenol as needed for the headache.  Return if worsening headaches, fever, vision changes, or any other neurologic symptoms develop (numbness, tingling, weakness, dizziness, fainting spell)

## 2013-07-14 NOTE — Progress Notes (Signed)
Chief Complaint  Patient presents with  . Cough    coughing sometimes, has some drainage. Has been having HA's x 6 weeks. Also her scalp has been very sore.    She is here today "because the whole head hurts", and recently the scalp has become sore.  She has some allergies but was told she can't take anything for it.  Headaches come and go, worse at night.  She denies any pain currently.  She finds that her pain is worse at night, but better when she takes one of the pillows away. The headache and pain is mainly on the left side of her head, sometimes posteriorly.    Started with "butterflies" in her ears about 6-8 weeks ago.  She pulled out "dry fish scales"/wax and that sensation has resolved.  She sometimes has pain on the left side of her head (whole head) and scalp feels sore when she combs her hair on the left side.  Intermittently she has pain in her left>right cheek, sometimes shooting pain from the ear.  She coughs intermittently, sometimes gets up phlegm which is clear.  Denies fevers  Past Medical History  Diagnosis Date  . Peripheral vascular disease 2010    Known distal left SFA occlusion, known right posterior tibial artery occlusion as well as left posterior and anterior tibial artery occlusion.; Most recent Dopplers November 2012: Patent right SFA stent, left ABI 0.56  . Coronary artery disease - history of MI October 2006    Severe RCA lesion -- 2.5 Mm x 12 Mm Vision BMS; January 2007 -. Distal stent stenosis in RCA treated with 2.5 mm x y 12 mm Vision BMS stent distally;  circumflex/OM stenosis -- 2.5 mg 12 mm Mini Vision BMS ;  most recent cath October 11: 100% RCA occlusion with ISR. Left right collaterals. Patent to the extent, the 70% AV groove circumflex. Apical LAD stenosis 70-80 %, diffuse disease; EF 65-70%   . Aortic valve stenosis, senile calcific January 2011    Echo: Normal EF 60-65%, mild aortic stenosis - estimated valve area 1.1 cm -- recently followed up with new  echocardiogram  . Chronic kidney disease 1964  . Hypertension     Previously on multiple medications now on only clonidine  . Stroke     Carotid artery Doppler show right internal carotid 60-80% stenosis. Previously followed by Dr. Alton Revere  . Non-ST elevation MI (NSTEMI) 2006, 2011  . DVT (deep venous thrombosis)   . Aneurysm   . Anemia   . Cancer 1963    ovarian cancer  . Arthritis   . Bilateral renal artery stenosis 2006 Orson Slick)    - Right renal artery 60% stenosis March '13  . Dyslipidemia, goal LDL below 70      no longer on statin  . Cerebral aneurysm without rupture     3 mm; followed by Dr. Lovell Sheehan as well as vascular surgery   Past Surgical History  Procedure Laterality Date  . Abdominal hysterectomy    . Bladder surgery      bladder tack  . Breast lumpectomy      left  . Appendectomy    . Femoral artery stent Right 2006, 2011, 2012    Atherectomy with PTA alone in 2006; April 2000: Right SFA - overlapping stents (6.0 mm x 170 mm x2 and 6.0 mm x 40 mm); Cutting Balloon PTA of right SFA ISR -- patent by Dopplers and cath in 2012  . Renal artery stent Bilateral 2006  Dr. Orson Slick; patent as of 2012 cath  . Iliac artery stent Right 2006  . Popliteal artery angioplasty  July 04, 2011    Done in the same setting as PTA of ISR in right SFA  . Doppler echocardiography  09/25/2009    ef 60 to 65%,aotric valve mild stenosis 1.1cm^2(VTI)  . Nm myocar perf wall motion  10/13/2009    EF 73%  . Coronary angioplasty with stent placement  05/27/2005    RCA - MiniVision BMS 2.5 mm x 12 mm  . Coronary angioplasty with stent placement Bilateral 01/23/20072007    Circumflex-OM -- 2.5 mm x 12 mm Mini Vision BMS; RCA distal to previous stent 2.5 mm x 12 mm Mini Vision BMS  . Cardiac catheterization  05/28/2010    EF 65-70%,multi vessel CAD  . Carotid doppler  02/04/2012    Done at [facility]----right internal 60 to 79%,bilateral external patent,bilateral common carotid artery tortuosity  present  . Renal doppler  11/13/2011    bil.renal arterial stents patent w/less than 60%,right and left kidneys equal in size  . Lower arterial extrem. doppler  07/23/2011    ABI RIGHT .92 ,LEFT ABI .56 ,RGT PROX.SFA STENT 0-49%,RGT SFA  W/O EVIDENCE OF SIGNIFICANT DIAMETER REDUCTION,RGT POPLITEAL ARTERY 50% NARROWING    History   Social History  . Marital Status: Widowed    Spouse Name: N/A    Number of Children: 6  . Years of Education: N/A   Occupational History  . Not on file.   Social History Main Topics  . Smoking status: Never Smoker   . Smokeless tobacco: Never Used  . Alcohol Use: No     Comment: social drinker  years ago-no longer  . Drug Use: No  . Sexual Activity: Not on file   Other Topics Concern  . Not on file   Social History Narrative   She is a widowed mother of 5. She has 32 grandchildren and great-grandchildren   combined. She is very active. Never smoked. She has not had a drink in over 30 years.    Current outpatient prescriptions:aspirin 81 MG EC tablet, Take 81 mg by mouth every other day. Swallow whole., Disp: , Rfl: ;  atorvastatin (LIPITOR) 10 MG tablet, Take 10 mg by mouth every other day., Disp: , Rfl: ;  carvedilol (COREG) 3.125 MG tablet, Take 3.125 mg by mouth daily., Disp: , Rfl: ;  Propylene Glycol-Glycerin (SOOTHE) 0.6-0.6 % SOLN, Apply 1 drop to eye daily., Disp: , Rfl:  acetaminophen (TYLENOL) 500 MG tablet, Take 500 mg by mouth as needed for pain., Disp: , Rfl:   Allergies  Allergen Reactions  . Codeine Rash   ROS:  Denies fevers, nausea, vomiting, diarrhea, nausea, vomiting, skin rashes, bleeding/bruising.  Denies shortness of breath, chest pain.  Has some shortness of breath with walking/exercise, stable/unchanged.  Denies ear pain or other concerns except as per HPI.  PHYSICAL EXAM: BP 138/88  Pulse 80  Temp(Src) 98.1 F (36.7 C) (Oral)  Ht 5\' 4"  (1.626 m)  Wt 149 lb (67.586 kg)  BMI 25.56 kg/m2  Well developed, pleasant  elderly female in no distress.  Was coughing at early part of visit, but no coughing during remainder of visit. Speaking easily in full sentences.  Somewhat difficult to get history. HEENT:  PERRL, conjunctiva clear.  TM's and EAC's are normal, minimal hard cerumen in right ear, nonobstructive.  Nasal mucosa mild-mod edematous, pale, no purulence. OP--dentures. Moist mucus membranes, no erythema.  Sinuses nontender.  Temporal arteries and temporalis muscles nontender.  Area of scalp discomfort is entire left side--no skin lesions noted (scalp is somewhat dry throughout). She currently denies headache/pain. Neck: no lymphadenopathy or mass Heart: regular rate and rhythm with 3/6 SEM heard throughout Lungs: clear bilaterally Extremities: no edema Skin: no rashes Neuro: alert and oriented.  Cranial nerves intact.  Normal gait, strength.  ASSESSMENT/PLAN:  Headache(784.0) - suspect musculoskeletal component, possibly related to pillows.  may have intermittent sinus component but no e/o infection.  normal neuro exam - Plan: Sedimentation Rate  Need for prophylactic vaccination and inoculation against influenza - Plan: Flu vaccine HIGH DOSE PF (Fluzone Tri High dose)  Pure hypercholesterolemia  Headaches--none today.  More described as scalp tenderness.  No evidence of shingles, nor any h/o lesions to suggest PHN after a course of shingles.  Seems somewhat positional.  Discussed using just one pillow at night. Check ESR as eval for TA.   Use tylenol as needed for pain. No evidence of sinus infection or other bacterial infection.  Blood pressure is well controlled.  Last lipids were elevated, but she has been started on atorvastatin since last check.  F/u with Dr. Susann Givens on this

## 2013-08-05 ENCOUNTER — Ambulatory Visit: Payer: Medicare Other | Admitting: Cardiology

## 2013-08-13 ENCOUNTER — Ambulatory Visit: Payer: Medicare Other | Admitting: Family Medicine

## 2013-09-02 ENCOUNTER — Ambulatory Visit: Payer: Medicare Other | Admitting: Cardiology

## 2013-09-20 ENCOUNTER — Encounter: Payer: Self-pay | Admitting: Family

## 2013-09-21 ENCOUNTER — Encounter: Payer: Self-pay | Admitting: Family

## 2013-09-21 ENCOUNTER — Encounter (INDEPENDENT_AMBULATORY_CARE_PROVIDER_SITE_OTHER): Payer: Self-pay

## 2013-09-21 ENCOUNTER — Ambulatory Visit: Payer: Medicare Other | Admitting: Family

## 2013-09-21 ENCOUNTER — Ambulatory Visit (INDEPENDENT_AMBULATORY_CARE_PROVIDER_SITE_OTHER): Payer: Medicare Other | Admitting: Family

## 2013-09-21 ENCOUNTER — Other Ambulatory Visit (HOSPITAL_COMMUNITY): Payer: Medicare Other

## 2013-09-21 ENCOUNTER — Ambulatory Visit (HOSPITAL_COMMUNITY)
Admission: RE | Admit: 2013-09-21 | Discharge: 2013-09-21 | Disposition: A | Discharge status: Home / Self Care | Payer: Medicare Other | Source: Ambulatory Visit | Attending: Vascular Surgery | Admitting: Vascular Surgery

## 2013-09-21 VITALS — BP 195/101 | HR 83 | Resp 16 | Ht 64.0 in | Wt 136.0 lb

## 2013-09-21 DIAGNOSIS — I6529 Occlusion and stenosis of unspecified carotid artery: Secondary | ICD-10-CM

## 2013-09-21 DIAGNOSIS — I658 Occlusion and stenosis of other precerebral arteries: Secondary | ICD-10-CM | POA: Insufficient documentation

## 2013-09-21 DIAGNOSIS — R209 Unspecified disturbances of skin sensation: Secondary | ICD-10-CM

## 2013-09-21 DIAGNOSIS — Z48812 Encounter for surgical aftercare following surgery on the circulatory system: Secondary | ICD-10-CM | POA: Insufficient documentation

## 2013-09-21 DIAGNOSIS — M79609 Pain in unspecified limb: Secondary | ICD-10-CM

## 2013-09-21 DIAGNOSIS — R202 Paresthesia of skin: Secondary | ICD-10-CM | POA: Insufficient documentation

## 2013-09-21 NOTE — Progress Notes (Signed)
Established Carotid Patient   History of Present Illness  [name] is a 80 y.o. female patient of Dr. Kellie Simmering seen for known carotid stenosis right greater than left.  She returns today for follow up. She is very hypertensive now, states she took her medications this AM. She also has a history of PAD and is s/p FEMORAL ARTERY STENT Laterality: Right Date: 2006, 2011, 2012 Atherectomy with PTA alone in 2006; April 2000: Right SFA - overlapping stents (6.0 mm x 170 mm x2 and 6.0 mm x 40 mm); Cutting Balloon PTA of right SFA ISR -- patent by Dopplers and cath in 2012 RENAL ARTERY STENT Laterality: Bilateral Date: 2006 Dr. Deon Pilling; patent as of 2012 cath ILIAC ARTERY STENT Laterality: Right Date: 2006 POPLITEAL ARTERY ANGIOPLASTY Date: July 04, 2011 Done in the same setting as PTA of ISR in right SFA.  Patient has not had previous carotid artery intervention. She had a stroke about 10-12 years ago as manifested by left eye amaurosis fugax which last about 9 weeks, then resolved. She had an episode of dysphagia and right facial "twisting" about 2-3 weeks ago that lasted a few seconds, also has had several recent episodes of transient expressive aphasia. Pt denies any episodes of hemiparesis. Pt denies LE claudication symptoms, denies non-healing wounds.  Pt states Dr. Ellyn Hack is checking her LE stents, is scheduled for this soon. Has urinary urgency and incontinence, dysuria for about 2-3 weeks, states her urine has not been checked but she has seen her PCP for this recently.  Pt denies New Medical or Surgical History. Pt denies headaches today but states she has headaches sometimes.  Pt Diabetic: No Pt smoker: non-smoker  Pt meds include: Statin : Yes Betablocker: Yes ASA: Yes, but takes 2-3x/week on the advise of her PCP. Other anticoagulants/antiplatelets: Plavix stopped while hospitalized last year.   Past Medical History  Diagnosis Date  . Peripheral vascular disease 2010     Known distal left SFA occlusion, known right posterior tibial artery occlusion as well as left posterior and anterior tibial artery occlusion.; Most recent Dopplers November 2012: Patent right SFA stent, left ABI 0.56  . Coronary artery disease - history of MI October 2006    Severe RCA lesion -- 2.5 Mm x 12 Mm Vision BMS; January 2007 -. Distal stent stenosis in RCA treated with 2.5 mm x y 12 mm Vision BMS stent distally;  circumflex/OM stenosis -- 2.5 mg 12 mm Mini Vision BMS ;  most recent cath October 11: 100% RCA occlusion with ISR. Left right collaterals. Patent to the extent, the 70% AV groove circumflex. Apical LAD stenosis 70-80 %, diffuse disease; EF 65-70%   . Aortic valve stenosis, senile calcific January 2011    Echo: Normal EF 60-65%, mild aortic stenosis - estimated valve area 1.1 cm -- recently followed up with new echocardiogram  . Chronic kidney disease 1964  . Hypertension     Previously on multiple medications now on only clonidine  . Stroke     Carotid artery Doppler show right internal carotid 60-80% stenosis. Previously followed by Dr. Donzetta Kohut  . Non-ST elevation MI (NSTEMI) 2006, 2011  . DVT (deep venous thrombosis)   . Aneurysm   . Anemia   . Cancer 1963    ovarian cancer  . Arthritis   . Bilateral renal artery stenosis 2006 Deon Pilling)    - Right renal artery 60% stenosis March '13  . Dyslipidemia, goal LDL below 70      no  longer on statin  . Cerebral aneurysm without rupture     3 mm; followed by Dr. Arnoldo Morale as well as vascular surgery    Social History History  Substance Use Topics  . Smoking status: Never Smoker   . Smokeless tobacco: Never Used  . Alcohol Use: No     Comment: social drinker  years ago-no longer    Family History Family History  Problem Relation Age of Onset  . Irritable bowel syndrome Daughter   . Colon cancer Neg Hx     Surgical History Past Surgical History  Procedure Laterality Date  . Abdominal hysterectomy    . Bladder  surgery      bladder tack  . Breast lumpectomy      left  . Appendectomy    . Femoral artery stent Right 2006, 2011, 2012    Atherectomy with PTA alone in 2006; April 2000: Right SFA - overlapping stents (6.0 mm x 170 mm x2 and 6.0 mm x 40 mm); Cutting Balloon PTA of right SFA ISR -- patent by Dopplers and cath in 2012  . Renal artery stent Bilateral 2006    Dr. Deon Pilling; patent as of 2012 cath  . Iliac artery stent Right 2006  . Popliteal artery angioplasty  July 04, 2011    Done in the same setting as PTA of ISR in right SFA  . Doppler echocardiography  09/25/2009    ef 60 to 65%,aotric valve mild stenosis 1.1cm^2(VTI)  . Nm myocar perf wall motion  10/13/2009    EF 73%  . Coronary angioplasty with stent placement  05/27/2005    RCA - MiniVision BMS 2.5 mm x 12 mm  . Coronary angioplasty with stent placement Bilateral 01/23/20072007    Circumflex-OM -- 2.5 mm x 12 mm Mini Vision BMS; RCA distal to previous stent 2.5 mm x 12 mm Mini Vision BMS  . Cardiac catheterization  05/28/2010    EF 65-70%,multi vessel CAD  . Carotid doppler  02/04/2012    Done at [facility]----right internal 60 to 79%,bilateral external patent,bilateral common carotid artery tortuosity present  . Renal doppler  11/13/2011    bil.renal arterial stents patent w/less than 60%,right and left kidneys equal in size  . Lower arterial extrem. doppler  07/23/2011    ABI RIGHT .92 ,LEFT ABI .59 ,RGT PROX.SFA STENT 0-49%,RGT SFA  W/O EVIDENCE OF SIGNIFICANT DIAMETER REDUCTION,RGT POPLITEAL ARTERY 50% NARROWING     Allergies  Allergen Reactions  . Codeine Rash    Current Outpatient Prescriptions  Medication Sig Dispense Refill  . acetaminophen (TYLENOL) 500 MG tablet Take 500 mg by mouth as needed for pain.      Marland Kitchen aspirin 81 MG EC tablet Take 81 mg by mouth every other day. Swallow whole.      Marland Kitchen atorvastatin (LIPITOR) 10 MG tablet Take 10 mg by mouth every other day.      . carvedilol (COREG) 3.125 MG tablet Take 3.125  mg by mouth daily.      Marland Kitchen Propylene Glycol-Glycerin (SOOTHE) 0.6-0.6 % SOLN Apply 1 drop to eye daily.       No current facility-administered medications for this visit.    Review of Systems : See HPI for pertinent positives and negatives.  Physical Examination  Filed Vitals:   09/21/13 1357  BP: 195/101  Pulse: 83  Resp: 16   Filed Weights   09/21/13 1357  Weight: 136 lb (61.689 kg)   Body mass index is 23.33 kg/(m^2).  General: WDWN female  in NAD GAIT: normal Eyes: Pupils equal Pulmonary:  CTAB, Negative  Rales, Negative rhonchi, & Negative wheezing.  Cardiac: regular Rhythm ,  Positive Murmurs.  VASCULAR EXAM Carotid Bruits Left Right   Negative Negative    Aorta is not palpable. Radial pulses are 1+ palpable and equal.                                                                                                                            LE Pulses LEFT RIGHT       POPLITEAL  not palpable   not palpable       POSTERIOR TIBIAL  not palpable   not palpable        DORSALIS PEDIS      ANTERIOR TIBIAL not palpable   palpable     Gastrointestinal: soft, nontender, BS WNL, no r/g,  negative masses.  Musculoskeletal: Negative muscle atrophy/wasting. M/S 4/5 in UE's, 3/5 in LE's, Extremities without ischemic changes.  Neurologic: A&O X 3; Appropriate Affect ; SENSATION ;normal;  Speech is normal CN 2-12 intact, Pain and light touch intact in extremities, Motor exam as listed above.   Non-Invasive Vascular Imaging CAROTID DUPLEX 09/21/2013   CEREBROVASCULAR DUPLEX EVALUATION    INDICATION: Carotid artery disease     PREVIOUS INTERVENTION(S):     DUPLEX EXAM:     RIGHT  LEFT  Peak Systolic Velocities (cm/s) End Diastolic Velocities (cm/s) Plaque LOCATION Peak Systolic Velocities (cm/s) End Diastolic Velocities (cm/s) Plaque  73 15  CCA PROXIMAL 66 21   49 12  CCA MID 62 16   47 10 HT CCA DISTAL 55 19   45 8  ECA 42 7   98 66 CP ICA PROXIMAL 70 23 HT   222 61  ICA MID 93 31   128 28  ICA DISTAL 78 25     4.53 ICA / CCA Ratio (PSV) 1.5  Antegrade  Vertebral Flow Antegrade    Brachial Systolic Pressure (mmHg)    Brachial Artery Waveforms     Plaque Morphology:  HM = Homogeneous, HT = Heterogeneous, CP = Calcific Plaque, SP = Smooth Plaque, IP = Irregular Plaque     ADDITIONAL FINDINGS:     IMPRESSION: 60-79% stenosis of the right internal carotid artery. <40% stenosis of the left internal carotid artery. Velocities may be underestimated due to calcific plaque with acoustic shadowing.    Compared to the previous exam:  No significant change in comparison to the last exam on 12/22/2012.    Assessment: [name] is a 80 y.o. female who presents with symptoms not clearly suggestive of lateralizing stroke or TIA symptoms. The right ICA has moderate ICA stenosis, the left ICA has mild stenosis.  The  ICA stenosis is  Unchanged from previous exam. Dr. Kellie Simmering spoke with pt and does not think that the ICA stenosis is severe enough to cause the symptoms described in the HPI, and he suggests that if she  has any more of the above symptoms that she be evaluated by a neurologist. Has UTI symptoms for several weeks, advised to see her PCP re this and her severe hypertension as soon as possible.  Plan:  Follow-up in 6 months with Carotid Duplex scan.   I discussed in depth with the patient the nature of atherosclerosis, and emphasized the importance of maximal medical management including strict control of blood pressure, blood glucose, and lipid levels, obtaining regular exercise, and continued cessation of smoking.  The patient is aware that without maximal medical management the underlying atherosclerotic disease process will progress, limiting the benefit of any interventions. The patient was given information about stroke prevention and what symptoms should prompt the patient to seek immediate medical care. Thank you for allowing [place] to  participate in this patient's care.  Clemon Chambers, RN, MSN, FNP-C [facility] Office: [phone]  Clinic Physician: Kellie Simmering  09/21/2013 12:44 PM

## 2013-09-21 NOTE — Patient Instructions (Signed)

## 2013-09-24 ENCOUNTER — Encounter (HOSPITAL_COMMUNITY): Payer: Self-pay | Admitting: Emergency Medicine

## 2013-09-24 ENCOUNTER — Inpatient Hospital Stay (HOSPITAL_COMMUNITY): Payer: Medicare Other

## 2013-09-24 ENCOUNTER — Inpatient Hospital Stay (HOSPITAL_COMMUNITY)
Admission: EM | Admit: 2013-09-24 | Discharge: 2013-09-25 | DRG: 065 | Disposition: A | Discharge status: Home / Self Care | Payer: Medicare Other | Attending: Internal Medicine | Admitting: Internal Medicine

## 2013-09-24 ENCOUNTER — Emergency Department (HOSPITAL_COMMUNITY): Payer: Medicare Other

## 2013-09-24 DIAGNOSIS — I25119 Atherosclerotic heart disease of native coronary artery with unspecified angina pectoris: Secondary | ICD-10-CM | POA: Diagnosis present

## 2013-09-24 DIAGNOSIS — Z7982 Long term (current) use of aspirin: Secondary | ICD-10-CM

## 2013-09-24 DIAGNOSIS — Z48812 Encounter for surgical aftercare following surgery on the circulatory system: Secondary | ICD-10-CM

## 2013-09-24 DIAGNOSIS — R4789 Other speech disturbances: Secondary | ICD-10-CM

## 2013-09-24 DIAGNOSIS — I701 Atherosclerosis of renal artery: Secondary | ICD-10-CM | POA: Diagnosis present

## 2013-09-24 DIAGNOSIS — R29818 Other symptoms and signs involving the nervous system: Secondary | ICD-10-CM

## 2013-09-24 DIAGNOSIS — N12 Tubulo-interstitial nephritis, not specified as acute or chronic: Secondary | ICD-10-CM

## 2013-09-24 DIAGNOSIS — N183 Chronic kidney disease, stage 3 unspecified: Secondary | ICD-10-CM | POA: Diagnosis present

## 2013-09-24 DIAGNOSIS — I350 Nonrheumatic aortic (valve) stenosis: Secondary | ICD-10-CM

## 2013-09-24 DIAGNOSIS — E785 Hyperlipidemia, unspecified: Secondary | ICD-10-CM

## 2013-09-24 DIAGNOSIS — Z79899 Other long term (current) drug therapy: Secondary | ICD-10-CM

## 2013-09-24 DIAGNOSIS — I129 Hypertensive chronic kidney disease with stage 1 through stage 4 chronic kidney disease, or unspecified chronic kidney disease: Secondary | ICD-10-CM | POA: Diagnosis present

## 2013-09-24 DIAGNOSIS — I6350 Cerebral infarction due to unspecified occlusion or stenosis of unspecified cerebral artery: Principal | ICD-10-CM | POA: Diagnosis present

## 2013-09-24 DIAGNOSIS — R202 Paresthesia of skin: Secondary | ICD-10-CM

## 2013-09-24 DIAGNOSIS — I658 Occlusion and stenosis of other precerebral arteries: Secondary | ICD-10-CM | POA: Diagnosis present

## 2013-09-24 DIAGNOSIS — I2510 Atherosclerotic heart disease of native coronary artery without angina pectoris: Secondary | ICD-10-CM | POA: Diagnosis present

## 2013-09-24 DIAGNOSIS — I639 Cerebral infarction, unspecified: Secondary | ICD-10-CM | POA: Diagnosis present

## 2013-09-24 DIAGNOSIS — I359 Nonrheumatic aortic valve disorder, unspecified: Secondary | ICD-10-CM | POA: Diagnosis present

## 2013-09-24 DIAGNOSIS — Z91199 Patient's noncompliance with other medical treatment and regimen due to unspecified reason: Secondary | ICD-10-CM

## 2013-09-24 DIAGNOSIS — R29810 Facial weakness: Secondary | ICD-10-CM | POA: Diagnosis present

## 2013-09-24 DIAGNOSIS — I219 Acute myocardial infarction, unspecified: Secondary | ICD-10-CM

## 2013-09-24 DIAGNOSIS — Z8543 Personal history of malignant neoplasm of ovary: Secondary | ICD-10-CM

## 2013-09-24 DIAGNOSIS — B9620 Unspecified Escherichia coli [E. coli] as the cause of diseases classified elsewhere: Secondary | ICD-10-CM

## 2013-09-24 DIAGNOSIS — R2990 Unspecified symptoms and signs involving the nervous system: Secondary | ICD-10-CM

## 2013-09-24 DIAGNOSIS — D126 Benign neoplasm of colon, unspecified: Secondary | ICD-10-CM

## 2013-09-24 DIAGNOSIS — Z9861 Coronary angioplasty status: Secondary | ICD-10-CM

## 2013-09-24 DIAGNOSIS — I671 Cerebral aneurysm, nonruptured: Secondary | ICD-10-CM | POA: Diagnosis present

## 2013-09-24 DIAGNOSIS — N184 Chronic kidney disease, stage 4 (severe): Secondary | ICD-10-CM | POA: Diagnosis present

## 2013-09-24 DIAGNOSIS — R209 Unspecified disturbances of skin sensation: Secondary | ICD-10-CM

## 2013-09-24 DIAGNOSIS — I739 Peripheral vascular disease, unspecified: Secondary | ICD-10-CM | POA: Diagnosis present

## 2013-09-24 DIAGNOSIS — Z86718 Personal history of other venous thrombosis and embolism: Secondary | ICD-10-CM

## 2013-09-24 DIAGNOSIS — Z8673 Personal history of transient ischemic attack (TIA), and cerebral infarction without residual deficits: Secondary | ICD-10-CM

## 2013-09-24 DIAGNOSIS — R471 Dysarthria and anarthria: Secondary | ICD-10-CM | POA: Diagnosis present

## 2013-09-24 DIAGNOSIS — Z9119 Patient's noncompliance with other medical treatment and regimen: Secondary | ICD-10-CM

## 2013-09-24 DIAGNOSIS — I63239 Cerebral infarction due to unspecified occlusion or stenosis of unspecified carotid arteries: Secondary | ICD-10-CM

## 2013-09-24 DIAGNOSIS — N1330 Unspecified hydronephrosis: Secondary | ICD-10-CM

## 2013-09-24 DIAGNOSIS — I6529 Occlusion and stenosis of unspecified carotid artery: Secondary | ICD-10-CM | POA: Diagnosis present

## 2013-09-24 DIAGNOSIS — I252 Old myocardial infarction: Secondary | ICD-10-CM

## 2013-09-24 DIAGNOSIS — R159 Full incontinence of feces: Secondary | ICD-10-CM

## 2013-09-24 DIAGNOSIS — I6522 Occlusion and stenosis of left carotid artery: Secondary | ICD-10-CM | POA: Diagnosis present

## 2013-09-24 DIAGNOSIS — I10 Essential (primary) hypertension: Secondary | ICD-10-CM

## 2013-09-24 DIAGNOSIS — R319 Hematuria, unspecified: Secondary | ICD-10-CM

## 2013-09-24 DIAGNOSIS — M79609 Pain in unspecified limb: Secondary | ICD-10-CM

## 2013-09-24 LAB — POCT I-STAT, CHEM 8
BUN: 20 mg/dL (ref 6–23)
CHLORIDE: 107 meq/L (ref 96–112)
Calcium, Ion: 1.35 mmol/L — ABNORMAL HIGH (ref 1.13–1.30)
Creatinine, Ser: 1.5 mg/dL — ABNORMAL HIGH (ref 0.50–1.10)
Glucose, Bld: 121 mg/dL — ABNORMAL HIGH (ref 70–99)
HCT: 42 % (ref 36.0–46.0)
Hemoglobin: 14.3 g/dL (ref 12.0–15.0)
Potassium: 3.7 mEq/L (ref 3.7–5.3)
Sodium: 142 mEq/L (ref 137–147)
TCO2: 23 mmol/L (ref 0–100)

## 2013-09-24 LAB — RAPID URINE DRUG SCREEN, HOSP PERFORMED
Amphetamines: NOT DETECTED
BENZODIAZEPINES: NOT DETECTED
Barbiturates: NOT DETECTED
Cocaine: NOT DETECTED
Opiates: NOT DETECTED
Tetrahydrocannabinol: NOT DETECTED

## 2013-09-24 LAB — DIFFERENTIAL
Basophils Absolute: 0 10*3/uL (ref 0.0–0.1)
Basophils Relative: 0 % (ref 0–1)
EOS PCT: 6 % — AB (ref 0–5)
Eosinophils Absolute: 0.4 10*3/uL (ref 0.0–0.7)
LYMPHS ABS: 1.6 10*3/uL (ref 0.7–4.0)
Lymphocytes Relative: 27 % (ref 12–46)
Monocytes Absolute: 0.6 10*3/uL (ref 0.1–1.0)
Monocytes Relative: 10 % (ref 3–12)
NEUTROS PCT: 56 % (ref 43–77)
Neutro Abs: 3.4 10*3/uL (ref 1.7–7.7)

## 2013-09-24 LAB — URINALYSIS, ROUTINE W REFLEX MICROSCOPIC
Bilirubin Urine: NEGATIVE
GLUCOSE, UA: NEGATIVE mg/dL
Ketones, ur: NEGATIVE mg/dL
LEUKOCYTES UA: NEGATIVE
Nitrite: NEGATIVE
PH: 6 (ref 5.0–8.0)
Protein, ur: 100 mg/dL — AB
Specific Gravity, Urine: 1.014 (ref 1.005–1.030)
Urobilinogen, UA: 1 mg/dL (ref 0.0–1.0)

## 2013-09-24 LAB — TROPONIN I

## 2013-09-24 LAB — COMPREHENSIVE METABOLIC PANEL
ALT: 9 U/L (ref 0–35)
AST: 16 U/L (ref 0–37)
Albumin: 3.9 g/dL (ref 3.5–5.2)
Alkaline Phosphatase: 114 U/L (ref 39–117)
BUN: 19 mg/dL (ref 6–23)
CO2: 22 mEq/L (ref 19–32)
CREATININE: 1.39 mg/dL — AB (ref 0.50–1.10)
Calcium: 10.3 mg/dL (ref 8.4–10.5)
Chloride: 106 mEq/L (ref 96–112)
GFR calc non Af Amer: 33 mL/min — ABNORMAL LOW (ref >90)
GFR, EST AFRICAN AMERICAN: 38 mL/min — AB (ref >90)
Glucose, Bld: 117 mg/dL — ABNORMAL HIGH (ref 70–99)
Potassium: 3.9 mEq/L (ref 3.7–5.3)
Sodium: 144 mEq/L (ref 137–147)
TOTAL PROTEIN: 7.6 g/dL (ref 6.0–8.3)
Total Bilirubin: 0.6 mg/dL (ref 0.3–1.2)

## 2013-09-24 LAB — GLUCOSE, CAPILLARY: GLUCOSE-CAPILLARY: 111 mg/dL — AB (ref 70–99)

## 2013-09-24 LAB — APTT: aPTT: 27 seconds (ref 24–37)

## 2013-09-24 LAB — CBC
HEMATOCRIT: 38.3 % (ref 36.0–46.0)
Hemoglobin: 12.8 g/dL (ref 12.0–15.0)
MCH: 32.5 pg (ref 26.0–34.0)
MCHC: 33.4 g/dL (ref 30.0–36.0)
MCV: 97.2 fL (ref 78.0–100.0)
Platelets: 179 10*3/uL (ref 150–400)
RBC: 3.94 MIL/uL (ref 3.87–5.11)
RDW: 12.2 % (ref 11.5–15.5)
WBC: 6 10*3/uL (ref 4.0–10.5)

## 2013-09-24 LAB — HEMOGLOBIN A1C
Hgb A1c MFr Bld: 5.7 % — ABNORMAL HIGH (ref <5.7)
Mean Plasma Glucose: 117 mg/dL — ABNORMAL HIGH (ref <117)

## 2013-09-24 LAB — URINE MICROSCOPIC-ADD ON

## 2013-09-24 LAB — POCT I-STAT TROPONIN I: TROPONIN I, POC: 0.04 ng/mL (ref 0.00–0.08)

## 2013-09-24 LAB — ETHANOL: Alcohol, Ethyl (B): <11 mg/dL (ref 0–11)

## 2013-09-24 LAB — MRSA PCR SCREENING: MRSA BY PCR: NEGATIVE

## 2013-09-24 LAB — PROTIME-INR
INR: 1.05 (ref 0.00–1.49)
Prothrombin Time: 13.5 seconds (ref 11.6–15.2)

## 2013-09-24 MED ORDER — HEPARIN SODIUM (PORCINE) 5000 UNIT/ML IJ SOLN
5000.0000 [IU] | Freq: Three times a day (TID) | INTRAMUSCULAR | Status: DC
  Administered 2013-09-24 – 2013-09-25 (×2): 5000 [IU] via SUBCUTANEOUS
  Filled 2013-09-24 (×5): qty 1

## 2013-09-24 MED ORDER — ASPIRIN 300 MG RE SUPP: 300.0000 mg | Freq: Every day | RECTAL | Status: DC

## 2013-09-24 MED ORDER — NITROGLYCERIN IN D5W 200-5 MCG/ML-% IV SOLN
2.0000 ug/min | INTRAVENOUS | Status: DC
  Administered 2013-09-24: 5 ug/min via INTRAVENOUS
  Filled 2013-09-24: qty 250

## 2013-09-24 MED ORDER — ATORVASTATIN CALCIUM 10 MG PO TABS
10.0000 mg | ORAL_TABLET | ORAL | Status: DC
  Administered 2013-09-25: 10 mg via ORAL
  Filled 2013-09-24: qty 1

## 2013-09-24 MED ORDER — CLOPIDOGREL BISULFATE 75 MG PO TABS
75.0000 mg | ORAL_TABLET | Freq: Every day | ORAL | Status: DC
  Administered 2013-09-25: 75 mg via ORAL
  Filled 2013-09-24 (×2): qty 1

## 2013-09-24 MED ORDER — LABETALOL HCL 5 MG/ML IV SOLN
10.0000 mg | Freq: Once | INTRAVENOUS | Status: AC
  Administered 2013-09-24: 10 mg via INTRAVENOUS
  Filled 2013-09-24: qty 4

## 2013-09-24 MED ORDER — ASPIRIN 325 MG PO TABS: 325.0000 mg | ORAL_TABLET | Freq: Every day | ORAL | Status: DC

## 2013-09-24 NOTE — ED Notes (Signed)
Pt brought in by EMS. Pt with hypertension, pt has a hx of the same. Pt has history of 3 MI and 2 CVA. Pt presents with elevated BP. Pt denies NV or headache. Pt reports she was having slurred speech last evening, and reports tinglingin all 4 extremities. Pt has slight left sided facial droop. Pt also has carotid artery stenosis on the left side. Highest BP with EMS was 260/160.

## 2013-09-24 NOTE — H&P (Signed)
[facility] History and Physical  [name] ZOX:[id number] DOB: Oct 08, 1923 DOA: 09/24/2013  Referring physician: Dr. Sabra Heck PCP: Wyatt Haste, MD   Chief Complaint: left facial droop, tingling, slurred speech  HPI: [name] is a 80 y.o. female with PMH significant for CAD, carotid stenosis, prior CVA (w/o significant residual deficit), HLD, CKD stage 3 and HTN; came to ED after she noticed left facial droop, slurred speech and generalized tingling. Symptoms started approx 8 hours prior to admission before patient went to bed and continue until this morning. EMS was called and patient brought to ED; BP extremely elevated on route and in ED (260/160 and 199/111 respectively). Patient symptoms started improving and slurred speech pretty much resolved; continue with left facial droop. CT head negative. After discussing with neurology labetalol 10mg  IV given and [facility] called to admit patient for further evaluation and treatment.  Patient denies, CP, HA's, nausea, vomiting, SOB, fever, chills, dysuria or any other complaints.   Of note, patient is non-compliant with medications at home.   Review of Systems:  Negative except as mentioned on HPI.  Past Medical History  Diagnosis Date  . Peripheral vascular disease 2010    Known distal left SFA occlusion, known right posterior tibial artery occlusion as well as left posterior and anterior tibial artery occlusion.; Most recent Dopplers November 2012: Patent right SFA stent, left ABI 0.56  . Coronary artery disease - history of MI October 2006    Severe RCA lesion -- 2.5 Mm x 12 Mm Vision BMS; January 2007 -. Distal stent stenosis in RCA treated with 2.5 mm x y 12 mm Vision BMS stent distally;  circumflex/OM stenosis -- 2.5 mg 12 mm Mini Vision BMS ;  most recent cath October 11: 100% RCA occlusion with ISR. Left right collaterals. Patent to the extent, the 70% AV groove circumflex. Apical LAD stenosis 70-80 %, diffuse disease;  EF 65-70%   . Aortic valve stenosis, senile calcific January 2011    Echo: Normal EF 60-65%, mild aortic stenosis - estimated valve area 1.1 cm -- recently followed up with new echocardiogram  . Chronic kidney disease 1964  . Hypertension     Previously on multiple medications now on only clonidine  . Stroke     Carotid artery Doppler show right internal carotid 60-80% stenosis. Previously followed by Dr. Donzetta Kohut  . Non-ST elevation MI (NSTEMI) 2006, 2011  . DVT (deep venous thrombosis)   . Aneurysm   . Anemia   . Cancer 1963    ovarian cancer  . Arthritis   . Bilateral renal artery stenosis 2006 Deon Pilling)    - Right renal artery 60% stenosis March '13  . Dyslipidemia, goal LDL below 70      no longer on statin  . Cerebral aneurysm without rupture     3 mm; followed by Dr. Arnoldo Morale as well as vascular surgery   Past Surgical History  Procedure Laterality Date  . Abdominal hysterectomy    . Bladder surgery      bladder tack  . Breast lumpectomy      left  . Appendectomy    . Femoral artery stent Right 2006, 2011, 2012    Atherectomy with PTA alone in 2006; April 2000: Right SFA - overlapping stents (6.0 mm x 170 mm x2 and 6.0 mm x 40 mm); Cutting Balloon PTA of right SFA ISR -- patent by Dopplers and cath in 2012  . Renal artery stent Bilateral 2006    Dr. Deon Pilling;  patent as of 2012 cath  . Iliac artery stent Right 2006  . Popliteal artery angioplasty  July 04, 2011    Done in the same setting as PTA of ISR in right SFA  . Doppler echocardiography  09/25/2009    ef 60 to 65%,aotric valve mild stenosis 1.1cm^2(VTI)  . Nm myocar perf wall motion  10/13/2009    EF 73%  . Coronary angioplasty with stent placement  05/27/2005    RCA - MiniVision BMS 2.5 mm x 12 mm  . Coronary angioplasty with stent placement Bilateral 01/23/20072007    Circumflex-OM -- 2.5 mm x 12 mm Mini Vision BMS; RCA distal to previous stent 2.5 mm x 12 mm Mini Vision BMS  . Cardiac catheterization   05/28/2010    EF 65-70%,multi vessel CAD  . Carotid doppler  02/04/2012    Done at [facility]----right internal 60 to 79%,bilateral external patent,bilateral common carotid artery tortuosity present  . Renal doppler  11/13/2011    bil.renal arterial stents patent w/less than 60%,right and left kidneys equal in size  . Lower arterial extrem. doppler  07/23/2011    ABI RIGHT .92 ,LEFT ABI .41 ,RGT PROX.SFA STENT 0-49%,RGT SFA  W/O EVIDENCE OF SIGNIFICANT DIAMETER REDUCTION,RGT POPLITEAL ARTERY 50% NARROWING    Social History:  reports that she has never smoked. She has never used smokeless tobacco. She reports that she does not drink alcohol or use illicit drugs.  Allergies  Allergen Reactions  . Codeine Rash    Family History  Problem Relation Age of Onset  . Irritable bowel syndrome Daughter   . Colon cancer Neg Hx   . Cancer Mother     Ovarian  . Heart attack Father      Prior to Admission medications   Medication Sig Start Date End Date Taking? Authorizing Provider  acetaminophen (TYLENOL) 500 MG tablet Take 500 mg by mouth as needed for pain.   Yes Historical Provider, MD  aspirin 81 MG EC tablet Take 81 mg by mouth every other day. Swallow whole.   Yes Historical Provider, MD  atorvastatin (LIPITOR) 10 MG tablet Take 10 mg by mouth every other day. 05/05/13  Yes Denita Lung, MD  carvedilol (COREG) 3.125 MG tablet Take 3.125 mg by mouth daily. 01/29/13  Yes Leonie Man, MD  Propylene Glycol-Glycerin (SOOTHE) 0.6-0.6 % SOLN Apply 1 drop to eye daily.   Yes Historical Provider, MD   Physical Exam: Filed Vitals:   09/24/13 0700  BP: 199/111  Pulse: 73  Temp:   Resp: 9    BP 199/111  Pulse 73  Temp(Src) 98.5 F (36.9 C) (Oral)  Resp 9  Ht 5\' 4"  (0.865 m)  Wt 60.328 kg (133 lb)  BMI 22.82 kg/m2  SpO2 98%  General:  Appears calm and comfortable; positive facial droop (left); able to communicate and no significant slurred speech or focal motor deficit Eyes: PERRL, normal  lids, irises & conjunctiva, no nystagmus, no icterus ENT: grossly normal hearing, slight left facial droop, no ears or nostrils drainage; no erythema or exudates inside her mouth. Neck: no LAD, masses or thyromegaly Cardiovascular: RRR, no rubs or gallops. No LE edema. Respiratory: CTA bilaterally, no w/r/r. Normal respiratory effort. Abdomen: soft, nt, nd, positive BS Skin: no rash or induration seen on exam Musculoskeletal: grossly normal tone BUE/BLE Psychiatric: grossly normal mood and affect, speech appropriate Neurologic: MS 5/5 bilaterally, slight left facial droop and slurred speech, otherwise WNL.  Labs on Admission:  Basic Metabolic Panel:  Recent Labs Lab 09/24/13 0637 09/24/13 0642  NA 144 142  K 3.9 3.7  CL 106 107  CO2 22  --   GLUCOSE 117* 121*  BUN 19 20  CREATININE 1.39* 1.50*  CALCIUM 10.3  --    Liver Function Tests:  Recent Labs Lab 09/24/13 0637  AST 16  ALT 9  ALKPHOS 114  BILITOT 0.6  PROT 7.6  ALBUMIN 3.9   CBC:  Recent Labs Lab 09/24/13 0637 09/24/13 0642  WBC 6.0  --   NEUTROABS 3.4  --   HGB 12.8 14.3  HCT 38.3 42.0  MCV 97.2  --   PLT 179  --    Cardiac Enzymes:  Recent Labs Lab 09/24/13 0637  TROPONINI <0.30   CBG:  Recent Labs Lab 09/24/13 0656  GLUCAP 111*    Radiological Exams on Admission: Ct Head Wo Contrast  09/24/2013   CLINICAL DATA:  Facial droop  EXAM: CT HEAD WITHOUT CONTRAST  TECHNIQUE: Contiguous axial images were obtained from the base of the skull through the vertex without intravenous contrast.  COMPARISON:  Prior MRI from 05/16/2013  FINDINGS: Atrophy with chronic microvascular ischemic changes are grossly stable as compared to the most recent MRI. Encephalomalacia within the left occipital lobe is also stable, compatible with remote left PCA infarct. No new large vessel territory infarct identified. No intracranial hemorrhage. No mass lesion or midline shift. No extra-axial fluid collection.  Ventricles are stable in size without evidence of hydrocephalus.  Extensive vascular calcifications again noted within the intracranial circulation.  Calvarium is intact.  Orbits are within normal limits.  Paranasal sinuses and mastoid air cells are clear.  IMPRESSION: 1. No acute intracranial hemorrhage or infarct identified. 2. Remote left occipital infarct, unchanged. 3. Advanced age-related atrophy with chronic microvascular ischemic disease, grossly stable.   Electronically Signed   By: Jeannine Boga M.D.   On: 09/24/2013 06:38    EKG:  Ventricular Rate: 77  PR Interval: 162  QRS Duration: 80  QT Interval: 403  QTC Calculation: 456  R Axis: -6  Text Interpretation: Sinus rhythm Left axis deviation Left anterior fasicular block Abnormal ekg since last tracing no significant change    Assessment/Plan 1-Stroke-like symptoms: patient with slurred speech, tingling all over and left facial droop. Had hx of CVA in the past, HTN, HLD and carotid artery stenosis. Came out of window for any intervention. Symptoms improving and CT neg for acute intracranial abnormalities. -BP 199/111 in ED and 260/160 when paramedics arrived to pick her up -will admit to stepdown -start NTG drip for better control of BP (goal is for SBP in 180 range for next 24hours during acute ischemic setting -stroke/TIA protocol work up (2-D echo, MRI/MRA, A1C, lipid panel, TSH, SLP, PT/OT) -while NPO waiting to be tested for swallowing safety will use ASA PR for 2ry prevention -patient was on ASA as an outpatient but taking it inconsistently; will defer to neurology for rec's on preventive management therapy; given risk factors might benefit of been on plavix  -neurology consulted  2-Occlusion and stenosis of carotid artery with history of cerebral infarction: per recent evaluation by vascula surgery service (Dr. Kellie Simmering) continue medical therapy. -if MRI positive we might need to discuss with then further  approach/intervention  3-CKD (chronic kidney disease) stage 3, GFR 30-59 ml/min -stable and at baseline -will monitor  4-Coronary artery disease: no CP or SOB. -will continue statins, ASA/plavix and b-blocker when takin PO's  5-Dyslipidemia, goal LDL below 70: plan is to resume statins when taking PO's -will check lipid panel  6-HTN (hypertension), malignant: accelerated with TIA's symptoms. -will admit to step down and treat with nitro drip -medication noncompliance as main problem for uncontrolled BP. -will adjust medications prior to discharge  7-DVT: heparin   Neurology: Dr. Armida Sans  Code Status: Full Family Communication: no family at bedside Disposition Plan: inpatient, stepdown, LOS > 2 midnights  Time spent: 56 minutes  Jerrye Seebeck [facility] Pager [phone]

## 2013-09-24 NOTE — ED Provider Notes (Signed)
CSN: [id number]     Arrival date & time 09/24/13  0551 History   First MD Initiated Contact with Patient 09/24/13 [phone]     Chief Complaint  Patient presents with  . Hypertension  . Facial Droop   (Consider location/radiation/quality/duration/timing/severity/associated sxs/prior Treatment) HPI Comments: Patient is an 80 year old female with an extensive past medical history including coronary artery disease, aortic stenosis, CTD, hypertension, stroke,  NSTEMI and anemia who presents to the emergency department via EMS with hypertension, increasing over the past few days. Patient states that last night (>8 hrs PTA) she had some slurred speech which has subsided when she woke up this morning. She also looked in the mirror and noted she had some left-sided facial droop that is still present today. Also complaining of tingling in all 4 extremities. On EMS arrival, blood pressure was 260/160. She took all of her blood pressure medications last night. Patient has a history of carotid artery stenosis, worse on the left. Denies chest pain, shortness of breath, headache, vision change, dizziness, weakness or confusion. She lives at home with her granddaughter.  Patient is a 80 y.o. female presenting with hypertension. The history is provided by the patient and a relative.  Hypertension    Past Medical History  Diagnosis Date  . Peripheral vascular disease 2010    Known distal left SFA occlusion, known right posterior tibial artery occlusion as well as left posterior and anterior tibial artery occlusion.; Most recent Dopplers November 2012: Patent right SFA stent, left ABI 0.56  . Coronary artery disease - history of MI October 2006    Severe RCA lesion -- 2.5 Mm x 12 Mm Vision BMS; January 2007 -. Distal stent stenosis in RCA treated with 2.5 mm x y 12 mm Vision BMS stent distally;  circumflex/OM stenosis -- 2.5 mg 12 mm Mini Vision BMS ;  most recent cath October 11: 100% RCA occlusion with ISR. Left  right collaterals. Patent to the extent, the 70% AV groove circumflex. Apical LAD stenosis 70-80 %, diffuse disease; EF 65-70%   . Aortic valve stenosis, senile calcific January 2011    Echo: Normal EF 60-65%, mild aortic stenosis - estimated valve area 1.1 cm -- recently followed up with new echocardiogram  . Chronic kidney disease 1964  . Hypertension     Previously on multiple medications now on only clonidine  . Stroke     Carotid artery Doppler show right internal carotid 60-80% stenosis. Previously followed by Dr. Donzetta Kohut  . Non-ST elevation MI (NSTEMI) 2006, 2011  . DVT (deep venous thrombosis)   . Aneurysm   . Anemia   . Cancer 1963    ovarian cancer  . Arthritis   . Bilateral renal artery stenosis 2006 Deon Pilling)    - Right renal artery 60% stenosis March '13  . Dyslipidemia, goal LDL below 70      no longer on statin  . Cerebral aneurysm without rupture     3 mm; followed by Dr. Arnoldo Morale as well as vascular surgery   Past Surgical History  Procedure Laterality Date  . Abdominal hysterectomy    . Bladder surgery      bladder tack  . Breast lumpectomy      left  . Appendectomy    . Femoral artery stent Right 2006, 2011, 2012    Atherectomy with PTA alone in 2006; April 2000: Right SFA - overlapping stents (6.0 mm x 170 mm x2 and 6.0 mm x 40 mm); Cutting Balloon PTA of right  SFA ISR -- patent by Dopplers and cath in 2012  . Renal artery stent Bilateral 2006    Dr. Deon Pilling; patent as of 2012 cath  . Iliac artery stent Right 2006  . Popliteal artery angioplasty  July 04, 2011    Done in the same setting as PTA of ISR in right SFA  . Doppler echocardiography  09/25/2009    ef 60 to 65%,aotric valve mild stenosis 1.1cm^2(VTI)  . Nm myocar perf wall motion  10/13/2009    EF 73%  . Coronary angioplasty with stent placement  05/27/2005    RCA - MiniVision BMS 2.5 mm x 12 mm  . Coronary angioplasty with stent placement Bilateral 01/23/20072007    Circumflex-OM -- 2.5 mm x 12  mm Mini Vision BMS; RCA distal to previous stent 2.5 mm x 12 mm Mini Vision BMS  . Cardiac catheterization  05/28/2010    EF 65-70%,multi vessel CAD  . Carotid doppler  02/04/2012    Done at [facility]----right internal 60 to 79%,bilateral external patent,bilateral common carotid artery tortuosity present  . Renal doppler  11/13/2011    bil.renal arterial stents patent w/less than 60%,right and left kidneys equal in size  . Lower arterial extrem. doppler  07/23/2011    ABI RIGHT .92 ,LEFT ABI .70 ,RGT PROX.SFA STENT 0-49%,RGT SFA  W/O EVIDENCE OF SIGNIFICANT DIAMETER REDUCTION,RGT POPLITEAL ARTERY 50% NARROWING    Family History  Problem Relation Age of Onset  . Irritable bowel syndrome Daughter   . Colon cancer Neg Hx   . Cancer Mother     Ovarian  . Heart attack Father    History  Substance Use Topics  . Smoking status: Never Smoker   . Smokeless tobacco: Never Used  . Alcohol Use: No     Comment: social drinker  years ago-no longer   OB History   Grav Para Term Preterm Abortions TAB SAB Ect Mult Living                 Review of Systems  Neurological: Positive for facial asymmetry and speech difficulty.       Positive for extremity paraesthesias.  All other systems reviewed and are negative.    Allergies  Codeine  Home Medications   Current Outpatient Rx  Name  Route  Sig  Dispense  Refill  . acetaminophen (TYLENOL) 500 MG tablet   Oral   Take 500 mg by mouth as needed for pain.         Marland Kitchen aspirin 81 MG EC tablet   Oral   Take 81 mg by mouth every other day. Swallow whole.         Marland Kitchen atorvastatin (LIPITOR) 10 MG tablet   Oral   Take 10 mg by mouth every other day.         . carvedilol (COREG) 3.125 MG tablet   Oral   Take 3.125 mg by mouth daily.         Marland Kitchen Propylene Glycol-Glycerin (SOOTHE) 0.6-0.6 % SOLN   Ophthalmic   Apply 1 drop to eye daily.          BP 199/111  Pulse 73  Temp(Src) 98.5 F (36.9 C) (Oral)  Resp 9  Ht 5\' 4"  (1.626 m)  Wt  133 lb (60.328 kg)  BMI 22.82 kg/m2  SpO2 98% Physical Exam  Nursing note and vitals reviewed. Constitutional: She is oriented to person, place, and time. She appears well-developed and well-nourished. No distress.  HENT:  Head: Normocephalic  and atraumatic.  Mouth/Throat: Oropharynx is clear and moist.  Eyes: Conjunctivae and EOM are normal. Pupils are equal, round, and reactive to light.  Neck: Normal range of motion. Neck supple.  Cardiovascular: Normal rate, regular rhythm, intact distal pulses and normal pulses.   Murmur heard.  Systolic (blowing) murmur is present with a grade of 3/6  No extremity edema.  Pulmonary/Chest: Effort normal and breath sounds normal.  Abdominal: Soft. Bowel sounds are normal. There is no tenderness.  Musculoskeletal: Normal range of motion. She exhibits no edema.  Neurological: She is alert and oriented to person, place, and time. She has normal strength. No sensory deficit. Coordination normal. GCS eye subscore is 4. GCS verbal subscore is 5. GCS motor subscore is 6.  Mild left sided facial droop of mouth, more notable with smiling.  Skin: Skin is warm and dry. She is not diaphoretic.  Psychiatric: She has a normal mood and affect. Her speech is normal and behavior is normal.    ED Course  Procedures (including critical care time) Labs Review Labs Reviewed  DIFFERENTIAL - Abnormal; Notable for the following:    Eosinophils Relative 6 (*)    All other components within normal limits  COMPREHENSIVE METABOLIC PANEL - Abnormal; Notable for the following:    Glucose, Bld 117 (*)    Creatinine, Ser 1.39 (*)    GFR calc non Af Amer 33 (*)    GFR calc Af Amer 38 (*)    All other components within normal limits  GLUCOSE, CAPILLARY - Abnormal; Notable for the following:    Glucose-Capillary 111 (*)    All other components within normal limits  POCT I-STAT, CHEM 8 - Abnormal; Notable for the following:    Creatinine, Ser 1.50 (*)    Glucose, Bld 121  (*)    Calcium, Ion 1.35 (*)    All other components within normal limits  ETHANOL  PROTIME-INR  APTT  CBC  TROPONIN I  URINE RAPID DRUG SCREEN (HOSP PERFORMED)  URINALYSIS, ROUTINE W REFLEX MICROSCOPIC  POCT I-STAT TROPONIN I   Imaging Review Ct Head Wo Contrast  09/24/2013   CLINICAL DATA:  Facial droop  EXAM: CT HEAD WITHOUT CONTRAST  TECHNIQUE: Contiguous axial images were obtained from the base of the skull through the vertex without intravenous contrast.  COMPARISON:  Prior MRI from 05/16/2013  FINDINGS: Atrophy with chronic microvascular ischemic changes are grossly stable as compared to the most recent MRI. Encephalomalacia within the left occipital lobe is also stable, compatible with remote left PCA infarct. No new large vessel territory infarct identified. No intracranial hemorrhage. No mass lesion or midline shift. No extra-axial fluid collection. Ventricles are stable in size without evidence of hydrocephalus.  Extensive vascular calcifications again noted within the intracranial circulation.  Calvarium is intact.  Orbits are within normal limits.  Paranasal sinuses and mastoid air cells are clear.  IMPRESSION: 1. No acute intracranial hemorrhage or infarct identified. 2. Remote left occipital infarct, unchanged. 3. Advanced age-related atrophy with chronic microvascular ischemic disease, grossly stable.   Electronically Signed   By: Jeannine Boga M.D.   On: 09/24/2013 06:38    EKG Interpretation    Date/Time:  Friday September 24 2013 06:14:47 EST Ventricular Rate:  77 PR Interval:  162 QRS Duration: 80 QT Interval:  403 QTC Calculation: 456 R Axis:   -6 Text Interpretation:  Sinus rhythm Left axis deviation Left anterior fasicular block Abnormal ekg since last tracing no significant change Confirmed by MILLER  MD, BRIAN [phone]) on 09/24/2013 7:20:36 AM            MDM   1. Hypertension   2. Stroke-like episode     Patient presenting with hypertension,  198/121 in the ED, facial droop and slurred speech beginning greater than 8 hours prior to arrival. Slurred speech has since subsided, facial droop still notable when patient smiles. No weakness. Labs pending. CT head without any acute changes. Plan to consult neurology prior to blood pressure control. She is in no apparent distress. Case discussed with attending Dr. Sabra Heck who also evaluated patient and agrees with plan of care. 7:59 AM Per Dr. Doy Mince, neural hospitalist, patient given 10 mg would be to hold for blood pressure control. Workup otherwise without any acute findings, EKG unchanged. I spoke with Dr. Armida Sans, current neural hospitalist on-call who will consult patient. Patient admitted to [facility], Dr. Dyann Kief. MRI pending.   Illene Labrador, PA-C 09/24/13 0800

## 2013-09-24 NOTE — ED Provider Notes (Signed)
80 year old female, history of vascular disease including multiple myocardial infarctions and a stroke who presents with a complaint of high blood pressure. According to the patient's blood pressure has been gradually increasing but noticed a spike last night, it was as high as 260 as measured by the paramedics. No medications given prior to arrival, the patient does note associated diffuse paresthesias both upper and lower extremities as well as facial droop that she noticed in the mirror last night greater than 8 hours prior to arrival.  On exam the patient is hypertensive, she has normal strength in all 4 extremities, cranial nerves III through XII are intact except for slight facial droop, peripheral visual fields are normal, extraocular movements are intact, the patient will need acute treatment for blood pressure and evaluation for recurrent stroke.  Medical screening examination/treatment/procedure(s) were conducted as a shared visit with non-physician practitioner(s) and myself.  I personally evaluated the patient during the encounter.  Clinical Impression: severe hypertension      Johnna Acosta, MD 09/27/13 1154

## 2013-09-24 NOTE — Consult Note (Signed)
Referring Physician: Dr Dyann Kief   Chief Complaint: Dysarthria with left facial droop  HPI: [name] is an 80 y.o. female with multiple medical problems including a remote left occipital infarct, severe hypertension, hyperlipidemia, coronary artery disease, aortic stenosis, chronic kidney disease, peripheral vascular disease, a 3 mm cerebral aneurysm that is being followed by Dr. Arnoldo Morale, carotid artery disease, and other problems as detailed below.   Last night at approximately 8 PM the patient noted she was having difficulty with slurred speech. She looked in the mirror and noted a lower left facial droop. She also noted tingling all over her body; however, this is now resolved. She went on to bed and this morning when she awoke her symptoms were still present. EMS was summoned and she was brought to the emergency department for further evaluation.   In the ED her blood pressure was found to be 260/160 and is gradually being brought down. The patient has been on aspirin 81 mg daily in the past however at some point the dosage was decreased to 1 tablet several times per week. The patient admits that some weeks she may take one aspirin - other weeks she takes no aspirin at all. She had been on Plavix at some point in the past. Likewise she has been somewhat noncompliant with her antihypertensive medications. She initially was to take her medications twice daily; however, she did not like the way she was feeling on these medications and they were decreased to bedtime only.   Her carotid artery disease is being followed by Dr. Kellie Simmering. She was recently seen in his office on 09/21/2013. At that time she reported transient symptoms of aphasia, dysphagia, and facial asymmetry. Carotid Dopplers in the office revealed a 60-79% stenosis o f the right internal carotid artery and a less than 40% stenosis of the left internal carotid artery. She was scheduled to have a followup duplex in 6 months.  Date last  known well: Date: 09/23/2013 Time last known well: Time: 16:00 tPA Given: No: Out of window for TPA therapy  Past Medical History  Diagnosis Date  . Peripheral vascular disease 2010    Known distal left SFA occlusion, known right posterior tibial artery occlusion as well as left posterior and anterior tibial artery occlusion.; Most recent Dopplers November 2012: Patent right SFA stent, left ABI 0.56  . Coronary artery disease - history of MI October 2006    Severe RCA lesion -- 2.5 Mm x 12 Mm Vision BMS; January 2007 -. Distal stent stenosis in RCA treated with 2.5 mm x y 12 mm Vision BMS stent distally;  circumflex/OM stenosis -- 2.5 mg 12 mm Mini Vision BMS ;  most recent cath October 11: 100% RCA occlusion with ISR. Left right collaterals. Patent to the extent, the 70% AV groove circumflex. Apical LAD stenosis 70-80 %, diffuse disease; EF 65-70%   . Aortic valve stenosis, senile calcific January 2011    Echo: Normal EF 60-65%, mild aortic stenosis - estimated valve area 1.1 cm -- recently followed up with new echocardiogram  . Chronic kidney disease 1964  . Hypertension     Previously on multiple medications now on only clonidine  . Stroke     Carotid artery Doppler show right internal carotid 60-80% stenosis. Previously followed by Dr. Donzetta Kohut  . Non-ST elevation MI (NSTEMI) 2006, 2011  . DVT (deep venous thrombosis)   . Aneurysm   . Anemia   . Cancer 1963    ovarian cancer  .  Arthritis   . Bilateral renal artery stenosis 2006 Deon Pilling)    - Right renal artery 60% stenosis March '13  . Dyslipidemia, goal LDL below 70      no longer on statin  . Cerebral aneurysm without rupture     3 mm; followed by Dr. Arnoldo Morale as well as vascular surgery    Past Surgical History  Procedure Laterality Date  . Abdominal hysterectomy    . Bladder surgery      bladder tack  . Breast lumpectomy      left  . Appendectomy    . Femoral artery stent Right 2006, 2011, 2012    Atherectomy with PTA  alone in 2006; April 2000: Right SFA - overlapping stents (6.0 mm x 170 mm x2 and 6.0 mm x 40 mm); Cutting Balloon PTA of right SFA ISR -- patent by Dopplers and cath in 2012  . Renal artery stent Bilateral 2006    Dr. Deon Pilling; patent as of 2012 cath  . Iliac artery stent Right 2006  . Popliteal artery angioplasty  July 04, 2011    Done in the same setting as PTA of ISR in right SFA  . Doppler echocardiography  09/25/2009    ef 60 to 65%,aotric valve mild stenosis 1.1cm^2(VTI)  . Nm myocar perf wall motion  10/13/2009    EF 73%  . Coronary angioplasty with stent placement  05/27/2005    RCA - MiniVision BMS 2.5 mm x 12 mm  . Coronary angioplasty with stent placement Bilateral 01/23/20072007    Circumflex-OM -- 2.5 mm x 12 mm Mini Vision BMS; RCA distal to previous stent 2.5 mm x 12 mm Mini Vision BMS  . Cardiac catheterization  05/28/2010    EF 65-70%,multi vessel CAD  . Carotid doppler  02/04/2012    Done at [facility]----right internal 60 to 79%,bilateral external patent,bilateral common carotid artery tortuosity present  . Renal doppler  11/13/2011    bil.renal arterial stents patent w/less than 60%,right and left kidneys equal in size  . Lower arterial extrem. doppler  07/23/2011    ABI RIGHT .92 ,LEFT ABI .67 ,RGT PROX.SFA STENT 0-49%,RGT SFA  W/O EVIDENCE OF SIGNIFICANT DIAMETER REDUCTION,RGT POPLITEAL ARTERY 50% NARROWING     Family History  Problem Relation Age of Onset  . Irritable bowel syndrome Daughter   . Colon cancer Neg Hx   . Cancer Mother     Ovarian  . Heart attack Father died from MI at an early age   65 siblings all deceased No hx of strokes.   Social History:  reports that she has never smoked. She has never used smokeless tobacco. She reports that she does not drink alcohol or use illicit drugs.  Allergies:  Allergies  Allergen Reactions  . Codeine Rash    Medications:  No current facility-administered medications for this encounter.   Current Outpatient  Prescriptions  Medication Sig Dispense Refill  . acetaminophen (TYLENOL) 500 MG tablet Take 500 mg by mouth as needed for pain.      Marland Kitchen aspirin 81 MG EC tablet Take 81 mg by mouth every other day. Swallow whole.      Marland Kitchen atorvastatin (LIPITOR) 10 MG tablet Take 10 mg by mouth every other day.      . carvedilol (COREG) 3.125 MG tablet Take 3.125 mg by mouth daily.      Marland Kitchen Propylene Glycol-Glycerin (SOOTHE) 0.6-0.6 % SOLN Apply 1 drop to eye daily.        ROS: History obtained  from the patient  General ROS: negative for - chills, fatigue, fever, night sweats, weight gain. Positive for unintentional weight loss of 37 pounds over the past 3 months. Positive for occasional headaches and sore scalp. Psychological ROS: negative for - behavioral disorder, hallucinations, memory difficulties, mood swings or suicidal ideation Ophthalmic ROS: negative for - blurry vision, double vision, eye pain or loss of vision ENT ROS: negative for - epistaxis, nasal discharge, oral lesions, sore throat, tinnitus or vertigo Allergy and Immunology ROS: negative for - hives or itchy/watery eyes Hematological and Lymphatic ROS: negative for - bleeding problems, bruising or swollen lymph nodes Endocrine ROS: negative for - galactorrhea, hair pattern changes, polydipsia/polyuria or temperature intolerance Respiratory ROS: negative for -  hemoptysis, shortness of breath or wheezing. Positive for intermittent cough occasionally productive of yellow sputum. Cardiovascular ROS: negative for - dyspnea on exertion, edema or irregular heartbeat. Positive for exertional chest pain Gastrointestinal ROS: negative for - abdominal pain, diarrhea, hematemesis, nausea/vomiting or stool incontinence Genito-Urinary ROS: negative for - dysuria, hematuria, incontinence or urinary frequency/urgency Musculoskeletal ROS: negative for - joint swelling or muscular weakness. Positive for right knee arthritis. Neurological ROS: as noted in  HPI Dermatological ROS: negative for rash and skin lesion changes   Physical Examination: Blood pressure 199/111, pulse 73, temperature 98.5 F (36.9 C), temperature source Oral, resp. rate 9, height 5\' 4"  (1.626 m), weight 60.328 kg (133 lb), SpO2 98.00%.  General - pleasant alert 80 year old female in no acute distress Heart - Regular rate and rhythm - 2/6 harsh systolic murmur. Lungs - Clear to auscultation anteriorly Abdomen - Soft - non tender Extremities -  no edema Skin - Warm and dry   Neurologic Examination: Mental Status: Alert, oriented, thought content appropriate.  Speech mildly dysarthric without evidence of aphasia.  Able to follow 3 step commands without difficulty. Cranial Nerves: II: Discs not visualized; Visual fields grossly normal, pupils equal, round, reactive to light and accommodation III,IV, VI: ptosis not present, extra-ocular motions intact bilaterally V,VII: smile slight left facial droop, facial light touch sensation normal bilaterally VIII: hearing normal bilaterally IX,X: gag reflex present XI: bilateral shoulder shrug XII: midline tongue extension Motor: Right : Upper extremity   5/5    Left:     Upper extremity   5/5  Lower extremity   5/5     Lower extremity   5/5 Tone and bulk:normal tone throughout; no atrophy noted Sensory: Pinprick and light touch intact throughout, bilaterally Deep Tendon Reflexes: 2+ and symmetric throughout Plantars: Right: downgoing   Left: downgoing Cerebellar: normal finger-to-nose, normal rapid alternating movements  Gait: Deferred   Laboratory Studies:  Basic Metabolic Panel:  Recent Labs Lab 09/24/13 0637 09/24/13 0642  NA 144 142  K 3.9 3.7  CL 106 107  CO2 22  --   GLUCOSE 117* 121*  BUN 19 20  CREATININE 1.39* 1.50*  CALCIUM 10.3  --     Liver Function Tests:  Recent Labs Lab 09/24/13 0637  AST 16  ALT 9  ALKPHOS 114  BILITOT 0.6  PROT 7.6  ALBUMIN 3.9   No results found for this  basename: LIPASE, AMYLASE,  in the last 168 hours No results found for this basename: AMMONIA,  in the last 168 hours  CBC:  Recent Labs Lab 09/24/13 0637 09/24/13 0642  WBC 6.0  --   NEUTROABS 3.4  --   HGB 12.8 14.3  HCT 38.3 42.0  MCV 97.2  --   PLT 179  --  Cardiac Enzymes:  Recent Labs Lab 09/24/13 0637  TROPONINI <0.30    BNP: No components found with this basename: POCBNP,   CBG:  Recent Labs Lab 09/24/13 0656  GLUCAP 111*    Microbiology: Results for orders placed in visit on 12/04/12  CULTURE, URINE COMPREHENSIVE     Status: None   Collection Time    12/04/12 12:40 PM      Result Value Range Status   Colony Count NO GROWTH   Final   Organism ID, Bacteria NO GROWTH   Final    Coagulation Studies:  Recent Labs  09/24/13 0637  LABPROT 13.5  INR 1.05    Urinalysis: No results found for this basename: COLORURINE, APPERANCEUR, LABSPEC, PHURINE, GLUCOSEU, HGBUR, BILIRUBINUR, KETONESUR, PROTEINUR, UROBILINOGEN, NITRITE, LEUKOCYTESUR,  in the last 168 hours  Lipid Panel:    Component Value Date/Time   CHOL 257* 05/04/2013 1434   TRIG 178* 05/04/2013 1434   HDL 46 05/04/2013 1434   CHOLHDL 5.6 05/04/2013 1434   VLDL 36 05/04/2013 1434   LDLCALC 175* 05/04/2013 1434    HgbA1C:  Lab Results  Component Value Date   HGBA1C 5.7 09/03/2011    Urine Drug Screen:   No results found for this basename: labopia, cocainscrnur, labbenz, amphetmu, thcu, labbarb    Alcohol Level:  Recent Labs Lab 09/24/13 0637  ETH <11    Other results: EKG: Sinus rhythm rate 77 beats per minute  Imaging:  Ct Head Wo Contrast 09/24/2013   1. No acute intracranial hemorrhage or infarct identified. 2. Remote left occipital infarct, unchanged. 3. Advanced age-related atrophy with chronic microvascular ischemic disease, grossly stable.      Assessment: 80 y.o. female with multiple medical problems as documented above who presents with severe hypertension, left facial  droop, and dysarthria. Blood pressure on admission was 260/120. It is gradually being brought down to a more appropriate level. The patient is to be admitted for a full workup. Carotid Dopplers were performed earlier this month so there is no indication for a repeat study. Dr. Kellie Simmering may need to be notified.  Stroke Risk Factors - hyperlipidemia, hypertension and carotid artery stenosis and a previous stroke  Plan: 1. HgbA1c, fasting lipid panel 2. MRI, MRA  of the brain without contrast 3. PT consult, OT consult, Speech consult 4. Echocardiogram 5. Prophylactic therapy-Antiplatelet med: Aspirin - dose 81 mg. 6. Risk factor modification - poor compliance with medications 7 Telemetry monitoring 8. Frequent neuro checks 9. Dr. Aram Beecham to see patient.   Mikey Bussing PA-C [facility] Pager [phone] 09/24/2013, 8:36 AM     Patient seen and examined together with physician assistant and I concur with the assessment and plan.  Dorian Pod, MD

## 2013-09-25 DIAGNOSIS — N1330 Unspecified hydronephrosis: Secondary | ICD-10-CM

## 2013-09-25 DIAGNOSIS — I359 Nonrheumatic aortic valve disorder, unspecified: Secondary | ICD-10-CM

## 2013-09-25 DIAGNOSIS — I6350 Cerebral infarction due to unspecified occlusion or stenosis of unspecified cerebral artery: Principal | ICD-10-CM

## 2013-09-25 LAB — CBC
HCT: 36.1 % (ref 36.0–46.0)
HEMOGLOBIN: 11.9 g/dL — AB (ref 12.0–15.0)
MCH: 32.3 pg (ref 26.0–34.0)
MCHC: 33 g/dL (ref 30.0–36.0)
MCV: 98.1 fL (ref 78.0–100.0)
Platelets: 156 10*3/uL (ref 150–400)
RBC: 3.68 MIL/uL — ABNORMAL LOW (ref 3.87–5.11)
RDW: 12.7 % (ref 11.5–15.5)
WBC: 5.2 10*3/uL (ref 4.0–10.5)

## 2013-09-25 LAB — LIPID PANEL
CHOLESTEROL: 239 mg/dL — AB (ref 0–200)
HDL: 44 mg/dL (ref >39)
LDL Cholesterol: 167 mg/dL — ABNORMAL HIGH (ref 0–99)
Total CHOL/HDL Ratio: 5.4 RATIO
Triglycerides: 140 mg/dL (ref <150)
VLDL: 28 mg/dL (ref 0–40)

## 2013-09-25 LAB — BASIC METABOLIC PANEL
BUN: 17 mg/dL (ref 6–23)
CHLORIDE: 105 meq/L (ref 96–112)
CO2: 22 meq/L (ref 19–32)
CREATININE: 1.49 mg/dL — AB (ref 0.50–1.10)
Calcium: 10 mg/dL (ref 8.4–10.5)
GFR calc non Af Amer: 30 mL/min — ABNORMAL LOW (ref >90)
GFR, EST AFRICAN AMERICAN: 35 mL/min — AB (ref >90)
Glucose, Bld: 100 mg/dL — ABNORMAL HIGH (ref 70–99)
POTASSIUM: 3.9 meq/L (ref 3.7–5.3)
Sodium: 143 mEq/L (ref 137–147)

## 2013-09-25 LAB — HEMOGLOBIN A1C
Hgb A1c MFr Bld: 5.8 % — ABNORMAL HIGH (ref <5.7)
Mean Plasma Glucose: 120 mg/dL — ABNORMAL HIGH (ref <117)

## 2013-09-25 LAB — TSH
TSH: 1.655 u[IU]/mL (ref 0.350–4.500)
TSH: 1.473 u[IU]/mL (ref 0.350–4.500)

## 2013-09-25 MED ORDER — ASPIRIN 81 MG PO TBEC: 81.0000 mg | DELAYED_RELEASE_TABLET | Freq: Every day | ORAL | Status: DC

## 2013-09-25 MED ORDER — CARVEDILOL 3.125 MG PO TABS
3.1250 mg | ORAL_TABLET | Freq: Every day | ORAL | Status: DC
  Administered 2013-09-25: 3.125 mg via ORAL
  Filled 2013-09-25: qty 1

## 2013-09-25 MED ORDER — NITROGLYCERIN IN D5W 200-5 MCG/ML-% IV SOLN: 2.0000 ug/min | INTRAVENOUS | Status: DC

## 2013-09-25 MED ORDER — ASPIRIN EC 81 MG PO TBEC
81.0000 mg | DELAYED_RELEASE_TABLET | Freq: Every day | ORAL | Status: DC
  Administered 2013-09-25: 81 mg via ORAL
  Filled 2013-09-25: qty 1

## 2013-09-25 NOTE — Evaluation (Signed)
Physical Therapy Evaluation Patient Details Name: [name] MRN: [id number] DOB: June 01, 1924 Today's Date: 09/25/2013 Time: 4580-9983 PT Time Calculation (min): 26 min  PT Assessment / Plan / Recommendation History of Present Illness  80 y.o. female with multiple medical problems including a remote left occipital infarct, severe hypertension, hyperlipidemia, coronary artery disease, aortic stenosis, chronic kidney disease, peripheral vascular disease, a 3 mm cerebral aneurysm that is being followed by Dr. Arnoldo Morale, carotid artery disease, and other problems as detailed below.    Clinical Impression  Pt adm due to the above. Presents with decreased independence with functional mobility secondary to deficits indicated below. Pt to benefit from acute skilled PT to address deficits and increase mobility prior to D/C home. Pt with slight slurred speech during evaluation. Pt was alert and oriented x 3 and able to answer questions appropriately.Pt with good family support and will have 24/7 (A) upon D/C.     PT Assessment  Patient needs continued PT services    Follow Up Recommendations  No PT follow up;Supervision/Assistance - 24 hour    Does the patient have the potential to tolerate intense rehabilitation      Barriers to Discharge        Equipment Recommendations  None recommended by PT    Recommendations for Other Services     Frequency Min 4X/week    Precautions / Restrictions Precautions Precautions: Fall Restrictions Weight Bearing Restrictions: No   Pertinent Vitals/Pain Stable t/o session.       Mobility  Bed Mobility Overal bed mobility: Modified Independent General bed mobility comments: effortful but no (A) required  Transfers Overall transfer level: Needs assistance Equipment used: None Transfers: Sit to/from Stand Sit to Stand: Min guard General transfer comment: cues for sequencing and safety Ambulation/Gait Ambulation/Gait assistance: Min  guard Ambulation Distance (Feet): 200 Feet Assistive device: None Gait Pattern/deviations: Decreased stride length;Wide base of support Gait velocity: decreased  Gait velocity interpretation: Below normal speed for age/gender General Gait Details: min guard to steady for safety; no LOB noted; pt reports she is walking slower than her baseline;  pt fatigued and required standing rest break; cues for sequencing and safety  Modified Rankin (Stroke Patients Only) Pre-Morbid Rankin Score: No symptoms Modified Rankin: Moderately severe disability         PT Diagnosis: Abnormality of gait  PT Problem List: Decreased activity tolerance;Decreased balance;Decreased mobility PT Treatment Interventions: DME instruction;Gait training;Stair training;Functional mobility training;Therapeutic activities;Therapeutic exercise;Balance training;Neuromuscular re-education;Patient/family education     PT Goals(Current goals can be found in the care plan section) Acute Rehab PT Goals Patient Stated Goal: get home to my kids  PT Goal Formulation: With patient Time For Goal Achievement: 10/09/13 Potential to Achieve Goals: Good  Visit Information  Last PT Received On: 09/25/13 Assistance Needed: +1 History of Present Illness: 80 y.o. female with multiple medical problems including a remote left occipital infarct, severe hypertension, hyperlipidemia, coronary artery disease, aortic stenosis, chronic kidney disease, peripheral vascular disease, a 3 mm cerebral aneurysm that is being followed by Dr. Arnoldo Morale, carotid artery disease, and other problems as detailed below.         Prior [place] expects to be discharged to:: Private residence Living Arrangements: Children Available Help at Discharge: Family;Available 24 hours/day Type of Home: House Home Access: Stairs to enter [employer] of Steps: 1 Entrance Stairs-Rails: None Home Layout: One level Home  Equipment: Walker - 2 wheels;Shower seat Prior Function Level of Independence: Independent Comments: pt very active;  drives; cooks; cleans; takes care of grandchildren  Communication Communication: No difficulties;Other (comment) (pt talks out of Rt side of her mouth) Dominant Hand: Right    Cognition  Cognition Arousal/Alertness: Awake/alert Behavior During Therapy: WFL for tasks assessed/performed Overall Cognitive Status: Within Functional Limits for tasks assessed    Extremity/Trunk Assessment Upper Extremity Assessment Upper Extremity Assessment: Overall WFL for tasks assessed Lower Extremity Assessment Lower Extremity Assessment: Overall WFL for tasks assessed Cervical / Trunk Assessment Cervical / Trunk Assessment: Normal   Balance Balance Overall balance assessment: Needs assistance Sitting-balance support: Feet supported;No upper extremity supported Sitting balance-Leahy Scale: Good Standing balance support: During functional activity;No upper extremity supported Standing balance-Leahy Scale: Fair  End of Session PT - End of Session Equipment Utilized During Treatment: Gait belt Activity Tolerance: Patient tolerated treatment well Patient left: in bed;with call bell/phone within reach;with family/visitor present Nurse Communication: Mobility status;Other (comment) (BP throughout session )  [place], [place], [place] [phone] 09/25/2013, 3:11 PM

## 2013-09-25 NOTE — Discharge Instructions (Signed)
STROKE/TIA DISCHARGE INSTRUCTIONS SMOKING Cigarette smoking nearly doubles your risk of having a stroke & is the single most alterable risk factor  If you smoke or have smoked in the last 12 months, you are advised to quit smoking for your health.  Most of the excess cardiovascular risk related to smoking disappears within a year of stopping.  Ask you doctor about anti-smoking medications  [place] Quit Line: 1-800-QUIT NOW  Free Smoking Cessation Classes [phone]  CHOLESTEROL Know your levels; limit fat & cholesterol in your diet  Lipid Panel     Component Value Date/Time   CHOL 239* 09/25/2013 0400   TRIG 140 09/25/2013 0400   HDL 44 09/25/2013 0400   CHOLHDL 5.4 09/25/2013 0400   VLDL 28 09/25/2013 0400   LDLCALC 167* 09/25/2013 0400      Many patients benefit from treatment even if their cholesterol is at goal.  Goal: Total Cholesterol (CHOL) less than 160  Goal:  Triglycerides (TRIG) less than 150  Goal:  HDL greater than 40  Goal:  LDL (LDLCALC) less than 100   BLOOD PRESSURE American Stroke Association blood pressure target is less that 120/80 mm/Hg  Your discharge blood pressure is:  BP: 142/96 mmHg  Monitor your blood pressure  Limit your salt and alcohol intake  Many individuals will require more than one medication for high blood pressure  DIABETES (A1c is a blood sugar average for last 3 months) Goal HGBA1c is under 7% (HBGA1c is blood sugar average for last 3 months)  Diabetes: No known diagnosis of diabetes    Lab Results  Component Value Date   HGBA1C 5.8* 09/25/2013     Your HGBA1c can be lowered with medications, healthy diet, and exercise.  Check your blood sugar as directed by your physician  Call your physician if you experience unexplained or low blood sugars.  PHYSICAL ACTIVITY/REHABILITATION Goal is 30 minutes at least 4 days per week  Activity: Increase activity slowly, Therapies: Speech Therapy: Outpatient Return to work:    Activity  decreases your risk of heart attack and stroke and makes your heart stronger.  It helps control your weight and blood pressure; helps you relax and can improve your mood.  Participate in a regular exercise program.  Talk with your doctor about the best form of exercise for you (dancing, walking, swimming, cycling).  DIET/WEIGHT Goal is to maintain a healthy weight  Your discharge diet is: Cardiac regular liquids Your height is:  Height: 5\' 4"  (162.6 cm) Your current weight is: Weight: 65.8 kg (145 lb 1 oz) Your Body Mass Index (BMI) is:  BMI (Calculated): 25  Following the type of diet specifically designed for you will help prevent another stroke.  Your goal weight range is:     Your goal Body Mass Index (BMI) is 19-24.  Healthy food habits can help reduce 3 risk factors for stroke:  High cholesterol, hypertension, and excess weight.  RESOURCES Stroke/Support Group:  Call [phone]   STROKE EDUCATION PROVIDED/REVIEWED AND GIVEN TO PATIENT Stroke warning signs and symptoms How to activate emergency medical system (call 911). Medications prescribed at discharge. Need for follow-up after discharge. Personal risk factors for stroke. Pneumonia vaccine given: No Flu vaccine given: No My questions have been answered, the writing is legible, and I understand these instructions.  I will adhere to these goals & educational materials that have been provided to me after my discharge from the hospital.

## 2013-09-25 NOTE — Progress Notes (Signed)
Stroke Team Progress Note  HISTORY   80 y.o. female with multiple medical problems including a remote left occipital infarct, severe hypertension, hyperlipidemia, coronary artery disease, aortic stenosis, chronic kidney disease, peripheral vascular disease, a 3 mm cerebral aneurysm that is being followed by Dr. Arnoldo Morale, carotid artery disease, and other problems as detailed below.  Last night at approximately 8 PM the patient noted she was having difficulty with slurred speech. She looked in the mirror and noted a lower left facial droop. She also noted tingling all over her body; however, this is now resolved. She went on to bed and this morning when she awoke her symptoms were still present. EMS was summoned and she was brought to the emergency department for further evaluation.  In the ED her blood pressure was found to be 260/160 and is gradually being brought down. The patient has been on aspirin 81 mg daily in the past however at some point the dosage was decreased to 1 tablet several times per week. The patient admits that some weeks she may take one aspirin - other weeks she takes no aspirin at all. She had been on Plavix at some point in the past. Likewise she has been somewhat noncompliant with her antihypertensive medications. She initially was to take her medications twice daily; however, she did not like the way she was feeling on these medications and they were decreased to bedtime only.  Her carotid artery disease is being followed by Dr. Kellie Simmering. She was recently seen in his office on 09/21/2013. At that time she reported transient symptoms of aphasia, dysphagia, and facial asymmetry. Carotid Dopplers in the office revealed a 60-79% stenosis o f the right internal carotid artery and a less than 40% stenosis of the left internal carotid artery. She was scheduled to have a followup duplex in 6 months.   SUBJECTIVE Found to have R post frontal small infarct 1x1.5cm. Symptoms improving and  close to her baseline.    OBJECTIVE Most recent Vital Signs: Filed Vitals:   09/25/13 0700 09/25/13 0743 09/25/13 0747 09/25/13 0800  BP: 203/89 182/108  193/82  Pulse: 71  77 71  Temp:    98 F (36.7 C)  TempSrc:    Oral  Resp: 14  15 18   Height:      Weight:      SpO2: 99%  100% 98%   CBG (last 3)   Recent Labs  09/24/13 0656  GLUCAP 111*    IV Fluid Intake:   . nitroGLYCERIN 6 mcg/min (09/25/13 0814)    MEDICATIONS  . atorvastatin  10 mg Oral QODAY  . clopidogrel  75 mg Oral Q breakfast  . heparin  5,000 Units Subcutaneous Q8H   PRN:    Diet:  Cardiac  Activity:  Up with assistance DVT Prophylaxis:  SCDs  CLINICALLY SIGNIFICANT STUDIES Basic Metabolic Panel:  Recent Labs Lab 09/24/13 0637 09/24/13 0642 09/25/13 0400  NA 144 142 143  K 3.9 3.7 3.9  CL 106 107 105  CO2 22  --  22  GLUCOSE 117* 121* 100*  BUN 19 20 17   CREATININE 1.39* 1.50* 1.49*  CALCIUM 10.3  --  10.0   Liver Function Tests:  Recent Labs Lab 09/24/13 0637  AST 16  ALT 9  ALKPHOS 114  BILITOT 0.6  PROT 7.6  ALBUMIN 3.9   CBC:  Recent Labs Lab 09/24/13 0637 09/24/13 0642 09/25/13 0400  WBC 6.0  --  5.2  NEUTROABS 3.4  --   --  HGB 12.8 14.3 11.9*  HCT 38.3 42.0 36.1  MCV 97.2  --  98.1  PLT 179  --  156   Coagulation:  Recent Labs Lab 09/24/13 0637  LABPROT 13.5  INR 1.05   Cardiac Enzymes:  Recent Labs Lab 09/24/13 0637  TROPONINI <0.30   Urinalysis:  Recent Labs Lab 09/24/13 1457  COLORURINE YELLOW  LABSPEC 1.014  PHURINE 6.0  GLUCOSEU NEGATIVE  HGBUR SMALL*  BILIRUBINUR NEGATIVE  KETONESUR NEGATIVE  PROTEINUR 100*  UROBILINOGEN 1.0  NITRITE NEGATIVE  LEUKOCYTESUR NEGATIVE   Lipid Panel    Component Value Date/Time   CHOL 239* 09/25/2013 0400   TRIG 140 09/25/2013 0400   HDL 44 09/25/2013 0400   CHOLHDL 5.4 09/25/2013 0400   VLDL 28 09/25/2013 0400   LDLCALC 167* 09/25/2013 0400   HgbA1C  Lab Results  Component Value Date   HGBA1C  5.7* 09/24/2013    Urine Drug Screen:     Component Value Date/Time   LABOPIA NONE DETECTED 09/24/2013 1457   COCAINSCRNUR NONE DETECTED 09/24/2013 1457   LABBENZ NONE DETECTED 09/24/2013 1457   AMPHETMU NONE DETECTED 09/24/2013 1457   THCU NONE DETECTED 09/24/2013 1457   LABBARB NONE DETECTED 09/24/2013 1457    Alcohol Level:  Recent Labs Lab 09/24/13 0637  ETH <11    Dg Chest 2 View  09/24/2013   CLINICAL DATA:  Stroke  EXAM: CHEST  2 VIEW  COMPARISON:  09/23/2012  FINDINGS: No cardiomegaly. Unchanged mediastinal contours, with notable aortic elongation and tortuosity. Central pulmonary vasculature is prominent, likely pulmonary hypertension in the setting of COPD. No acute infiltrate, edema, effusion, or pneumothorax. No acute osseous findings.  IMPRESSION: 1. No evidence of active cardiopulmonary disease. 2. COPD.   Electronically Signed   By: Jorje Guild M.D.   On: 09/24/2013 20:56   Ct Head Wo Contrast  09/24/2013   CLINICAL DATA:  Facial droop  EXAM: CT HEAD WITHOUT CONTRAST  TECHNIQUE: Contiguous axial images were obtained from the base of the skull through the vertex without intravenous contrast.  COMPARISON:  Prior MRI from 05/16/2013  FINDINGS: Atrophy with chronic microvascular ischemic changes are grossly stable as compared to the most recent MRI. Encephalomalacia within the left occipital lobe is also stable, compatible with remote left PCA infarct. No new large vessel territory infarct identified. No intracranial hemorrhage. No mass lesion or midline shift. No extra-axial fluid collection. Ventricles are stable in size without evidence of hydrocephalus.  Extensive vascular calcifications again noted within the intracranial circulation.  Calvarium is intact.  Orbits are within normal limits.  Paranasal sinuses and mastoid air cells are clear.  IMPRESSION: 1. No acute intracranial hemorrhage or infarct identified. 2. Remote left occipital infarct, unchanged. 3. Advanced age-related  atrophy with chronic microvascular ischemic disease, grossly stable.   Electronically Signed   By: Jeannine Boga M.D.   On: 09/24/2013 06:38   Mr Jodene Nam Head Wo Contrast  09/24/2013   CLINICAL DATA:  Left facial droop.  Slurred speech.  EXAM: MRI HEAD WITHOUT CONTRAST  MRA HEAD WITHOUT CONTRAST  TECHNIQUE: Multiplanar, multiecho pulse sequences of the brain and surrounding structures were obtained without intravenous contrast. Angiographic images of the head were obtained using MRA technique without contrast.  COMPARISON:  Head CT same day.  MRI 05/16/2013.  FINDINGS: MRI HEAD FINDINGS  Diffusion imaging shows a small (1 x 1.5 cm) acute infarction affecting are a posterior frontal gyrus on the right, possibly the precentral gyrus. No other acute infarction. No  hemorrhage or swelling.  Elsewhere, there are mild chronic small-vessel changes affecting the pons and cerebellum. The cerebral hemispheres show atrophy. There are old small vessel infarctions affecting the thalami, basal ganglia and hemispheric deep white matter. There is an old left occipital cortical infarction. No mass lesion, hemorrhage, hydrocephalus or extra-axial collection. No pituitary mass. Sinuses, middle ears and mastoids are clear.  MRA HEAD FINDINGS  Both internal carotid arteries are widely patent into the brain. No siphon stenosis. There is a 2 mm atherosclerotic aneurysm projecting medially from the carotid siphon on the left. There is mild narrowing of the supra clinoid internal carotid artery on the right. This vessel supplies only the right MCA territory. There are changes of atherosclerotic irregularity and narrowing in the right MCA territory. The left internal carotid artery supplies the left middle cerebral artery territory, both the anterior cerebral artery territories and the left PCA. The vessels show atherosclerotic irregularity but there is no flow-limiting stenosis evident.  Both vertebral arteries are patent but show  advanced atherosclerotic disease with marked irregularity and serial stenoses. The basilar artery likewise is irregular with generalized stenosis. There is an atherosclerotic aneurysm projecting posteriorly from the proximal basilar artery that measures 4 mm in diameter with a length of 7 mm. The superior cerebellar and posterior cerebral arteries are patent bilaterally, but show distal vessel atherosclerotic disease. The left PCA arises from the anterior circulation as discussed above.  IMPRESSION: No major vessel occlusion. Advanced diffuse atherosclerotic disease throughout the intracranial vessels as outlined above. There are multiple stenoses in the anterior circulation branch vessels. The posterior circulation vessels are severely disease with multiple serial stenoses and pronounced irregularity. There is an atherosclerotic aneurysm projecting posteriorly from the proximal basilar artery that measures approximately 4 x 7 mm.  1 x 1.5 cm acute infarction affecting a right posterior frontal gyrus, possibly the precentral gyrus.   Electronically Signed   By: Nelson Chimes M.D.   On: 09/24/2013 15:15   Mr Brain Wo Contrast  09/24/2013   CLINICAL DATA:  Left facial droop.  Slurred speech.  EXAM: MRI HEAD WITHOUT CONTRAST  MRA HEAD WITHOUT CONTRAST  TECHNIQUE: Multiplanar, multiecho pulse sequences of the brain and surrounding structures were obtained without intravenous contrast. Angiographic images of the head were obtained using MRA technique without contrast.  COMPARISON:  Head CT same day.  MRI 05/16/2013.  FINDINGS: MRI HEAD FINDINGS  Diffusion imaging shows a small (1 x 1.5 cm) acute infarction affecting are a posterior frontal gyrus on the right, possibly the precentral gyrus. No other acute infarction. No hemorrhage or swelling.  Elsewhere, there are mild chronic small-vessel changes affecting the pons and cerebellum. The cerebral hemispheres show atrophy. There are old small vessel infarctions affecting  the thalami, basal ganglia and hemispheric deep white matter. There is an old left occipital cortical infarction. No mass lesion, hemorrhage, hydrocephalus or extra-axial collection. No pituitary mass. Sinuses, middle ears and mastoids are clear.  MRA HEAD FINDINGS  Both internal carotid arteries are widely patent into the brain. No siphon stenosis. There is a 2 mm atherosclerotic aneurysm projecting medially from the carotid siphon on the left. There is mild narrowing of the supra clinoid internal carotid artery on the right. This vessel supplies only the right MCA territory. There are changes of atherosclerotic irregularity and narrowing in the right MCA territory. The left internal carotid artery supplies the left middle cerebral artery territory, both the anterior cerebral artery territories and the left PCA. The vessels show atherosclerotic  irregularity but there is no flow-limiting stenosis evident.  Both vertebral arteries are patent but show advanced atherosclerotic disease with marked irregularity and serial stenoses. The basilar artery likewise is irregular with generalized stenosis. There is an atherosclerotic aneurysm projecting posteriorly from the proximal basilar artery that measures 4 mm in diameter with a length of 7 mm. The superior cerebellar and posterior cerebral arteries are patent bilaterally, but show distal vessel atherosclerotic disease. The left PCA arises from the anterior circulation as discussed above.  IMPRESSION: No major vessel occlusion. Advanced diffuse atherosclerotic disease throughout the intracranial vessels as outlined above. There are multiple stenoses in the anterior circulation branch vessels. The posterior circulation vessels are severely disease with multiple serial stenoses and pronounced irregularity. There is an atherosclerotic aneurysm projecting posteriorly from the proximal basilar artery that measures approximately 4 x 7 mm.  1 x 1.5 cm acute infarction affecting a  right posterior frontal gyrus, possibly the precentral gyrus.   Electronically Signed   By: Nelson Chimes M.D.   On: 09/24/2013 15:15    CT of the brain  1. No acute intracranial hemorrhage or infarct identified.  2. Remote left occipital infarct,   MRI of the brain  1 x 1.5 cm acute infarction affecting a right posterior frontal  gyrus,    MRA of the brain  There is an atherosclerotic aneurysm projecting posteriorly from the proximal  basilar artery that measures approximately 4 x 7 mm.   2D Echocardiogram    Carotid Doppler    CXR    EKG  sinus Therapy Recommendations pending  Physical Exam   Neurologic Examination:  Mental Status:  Alert, oriented, thought content appropriate. Speech mildly dysarthric without evidence of aphasia. Able to follow 3 step commands without difficulty.  Cranial Nerves:  II: Discs not visualized; Visual fields grossly normal, pupils equal, round, reactive to light and accommodation  III,IV, VI: ptosis not present, extra-ocular motions intact bilaterally  V,VII: no evidence of facial droop today, facial light touch sensation normal bilaterally  VIII: hearing normal bilaterally  IX,X: gag reflex present  XI: bilateral shoulder shrug  XII: midline tongue extension  Motor:  Right : Upper extremity 5/5 Left: Upper extremity 5/5  Lower extremity 5/5 Lower extremity 5/5  Tone and bulk:normal tone throughout; no atrophy noted  Sensory: Pinprick and light touch intact throughout, bilaterally  Deep Tendon Reflexes: 2+ and symmetric throughout  Plantars:  Right: downgoing Left: downgoing  Cerebellar:  normal finger-to-nose, normal rapid alternating movements  Gait: Deferred   ASSESSMENT Ms. [name] is a 80 y.o. female  presents with severe hypertension, left facial droop, and dysarthria. Blood pressure on admission was 260/120. It is gradually being brought down to a more appropriate level. The patient is to be admitted for a full workup.  Carotid Dopplers were performed 4 days prior to admission.  Pt's L facial droop resolved. States minor dysarthria still present.     R frontal ischemic stroke  60-79% R ICA stenosis   Hospital day # 1  TREATMENT/PLAN  Daily ASA use as pt was not taking it daily prior to admission  BP management  2D echo  Pt/ot   Possible d/c today if 2dEcho is complete and Pt clears pt.    Discussed with primary care team.   Leotis Pain  09/25/2013 10:28 AM  I have personally obtained a history, examined the patient, evaluated imaging results, and formulated the assessment and plan of care. I agree with the above.

## 2013-09-25 NOTE — Discharge Summary (Signed)
Physician Discharge Summary  [name] MEQ:[id number] DOB: 07-Oct-1923 DOA: 09/24/2013  PCP: Wyatt Haste, MD  Admit date: 09/24/2013 Discharge date: 09/25/2013  Time spent: >45 minutes  Recommendations for Outpatient Follow-up:  1. outpt SLP   Discharge Diagnoses:  Principal Problem:   CVA (cerebral infarction) Active Problems:   Occlusion and stenosis of carotid artery with history of cerebral infarction   CKD (chronic kidney disease) stage 3, GFR 30-59 ml/min   Coronary artery disease   Dyslipidemia, goal LDL below 70   HTN (hypertension), malignant   HLD (hyperlipidemia)   Discharge Condition: stable  Diet recommendation: heart healthy  Filed Weights   09/24/13 0609 09/24/13 1736  Weight: 60.328 kg (133 lb) 65.8 kg (145 lb 1 oz)    History of present illness:  80 y.o. female with multiple medical problems including a remote left occipital infarct, severe hypertension, hyperlipidemia, coronary artery disease, aortic stenosis, chronic kidney disease, peripheral vascular disease, a 3 mm cerebral aneurysm that is being followed by Dr. Arnoldo Morale, carotid artery disease.  The night before admission at approximately 8 PM the patient noted she was having difficulty with slurred speech. She looked in the mirror and noted a lower left facial droop. She also noted tingling all over her body which was temporary. She went to bed and when she awoke, her symptoms were still present. EMS was summoned and she was brought to the emergency department for further evaluation.     Hospital Course:   CVA- right posterior frontal - will need daily ASA- was taking it 1-2 times a week - ECHO negative for thrombus- reveals EF 29-79%- diastolic function could not be assesses but LA filling pressure was elevated.   HTN - not compliant with medications - resume Coreg  Carotid Stenosis - recent duplex 09/21/13 - 60-79% on left- right < 40% - scheduled to have f/u duplex in 6  months  Hyperlipidemia - cont statin   Consultations:  Neurology   Discharge Exam: Filed Vitals:   09/25/13 1603  BP: 142/96  Pulse: 75  Temp: 97.9  Resp: 25    General: AAO x 3, no distress Cardiovascular: RRR, 2/6 murmur in left upper sternal border Respiratory: CTA b/l  Neuro: left facial droop and slurred speech, strength 5/5 in all extermities  Discharge Instructions      Discharge Orders   Future Appointments Provider Department Dept Phone   09/28/2013 4:15 PM Leonie Man, MD [facility] [phone]   03/23/2014 11:00 AM [facility] [address] [phone]   03/23/2014 12:00 PM Sharmon Leyden Nickel, NP [facility] [phone]   Future Orders Complete By Expires   Diet - low sodium heart healthy  As directed    Increase activity slowly  As directed    SLP eval and treat  As directed 09/25/2014       Medication List         acetaminophen 500 MG tablet  Commonly known as:  TYLENOL  Take 500 mg by mouth as needed for pain.     aspirin 81 MG EC tablet  Take 1 tablet (81 mg total) by mouth daily. Swallow whole.     atorvastatin 10 MG tablet  Commonly known as:  LIPITOR  Take 10 mg by mouth every other day.     carvedilol 3.125 MG tablet  Commonly known as:  COREG  Take 3.125 mg by mouth daily.     SOOTHE 0.6-0.6 % Soln  Generic drug:  Propylene Glycol-Glycerin  Apply 1 drop to eye daily.       Allergies  Allergen Reactions  . Codeine Rash      The results of significant diagnostics from this hospitalization (including imaging, microbiology, ancillary and laboratory) are listed below for reference.    Significant Diagnostic Studies: Dg Chest 2 View  09/24/2013   CLINICAL DATA:  Stroke  EXAM: CHEST  2 VIEW  COMPARISON:  09/23/2012  FINDINGS: No cardiomegaly. Unchanged mediastinal contours, with notable aortic elongation and tortuosity. Central pulmonary vasculature is  prominent, likely pulmonary hypertension in the setting of COPD. No acute infiltrate, edema, effusion, or pneumothorax. No acute osseous findings.  IMPRESSION: 1. No evidence of active cardiopulmonary disease. 2. COPD.   Electronically Signed   By: Tiburcio Pea M.D.   On: 09/24/2013 20:56   Ct Head Wo Contrast  09/24/2013   CLINICAL DATA:  Facial droop  EXAM: CT HEAD WITHOUT CONTRAST  TECHNIQUE: Contiguous axial images were obtained from the base of the skull through the vertex without intravenous contrast.  COMPARISON:  Prior MRI from 05/16/2013  FINDINGS: Atrophy with chronic microvascular ischemic changes are grossly stable as compared to the most recent MRI. Encephalomalacia within the left occipital lobe is also stable, compatible with remote left PCA infarct. No new large vessel territory infarct identified. No intracranial hemorrhage. No mass lesion or midline shift. No extra-axial fluid collection. Ventricles are stable in size without evidence of hydrocephalus.  Extensive vascular calcifications again noted within the intracranial circulation.  Calvarium is intact.  Orbits are within normal limits.  Paranasal sinuses and mastoid air cells are clear.  IMPRESSION: 1. No acute intracranial hemorrhage or infarct identified. 2. Remote left occipital infarct, unchanged. 3. Advanced age-related atrophy with chronic microvascular ischemic disease, grossly stable.   Electronically Signed   By: Rise Mu M.D.   On: 09/24/2013 06:38   Mr Maxine Glenn Head Wo Contrast  09/24/2013   CLINICAL DATA:  Left facial droop.  Slurred speech.  EXAM: MRI HEAD WITHOUT CONTRAST  MRA HEAD WITHOUT CONTRAST  TECHNIQUE: Multiplanar, multiecho pulse sequences of the brain and surrounding structures were obtained without intravenous contrast. Angiographic images of the head were obtained using MRA technique without contrast.  COMPARISON:  Head CT same day.  MRI 05/16/2013.  FINDINGS: MRI HEAD FINDINGS  Diffusion imaging shows  a small (1 x 1.5 cm) acute infarction affecting are a posterior frontal gyrus on the right, possibly the precentral gyrus. No other acute infarction. No hemorrhage or swelling.  Elsewhere, there are mild chronic small-vessel changes affecting the pons and cerebellum. The cerebral hemispheres show atrophy. There are old small vessel infarctions affecting the thalami, basal ganglia and hemispheric deep white matter. There is an old left occipital cortical infarction. No mass lesion, hemorrhage, hydrocephalus or extra-axial collection. No pituitary mass. Sinuses, middle ears and mastoids are clear.  MRA HEAD FINDINGS  Both internal carotid arteries are widely patent into the brain. No siphon stenosis. There is a 2 mm atherosclerotic aneurysm projecting medially from the carotid siphon on the left. There is mild narrowing of the supra clinoid internal carotid artery on the right. This vessel supplies only the right MCA territory. There are changes of atherosclerotic irregularity and narrowing in the right MCA territory. The left internal carotid artery supplies the left middle cerebral artery territory, both the anterior cerebral artery territories and the left PCA. The vessels show atherosclerotic irregularity but there is no flow-limiting stenosis evident.  Both vertebral arteries  are patent but show advanced atherosclerotic disease with marked irregularity and serial stenoses. The basilar artery likewise is irregular with generalized stenosis. There is an atherosclerotic aneurysm projecting posteriorly from the proximal basilar artery that measures 4 mm in diameter with a length of 7 mm. The superior cerebellar and posterior cerebral arteries are patent bilaterally, but show distal vessel atherosclerotic disease. The left PCA arises from the anterior circulation as discussed above.  IMPRESSION: No major vessel occlusion. Advanced diffuse atherosclerotic disease throughout the intracranial vessels as outlined above.  There are multiple stenoses in the anterior circulation branch vessels. The posterior circulation vessels are severely disease with multiple serial stenoses and pronounced irregularity. There is an atherosclerotic aneurysm projecting posteriorly from the proximal basilar artery that measures approximately 4 x 7 mm.  1 x 1.5 cm acute infarction affecting a right posterior frontal gyrus, possibly the precentral gyrus.   Electronically Signed   By: Nelson Chimes M.D.   On: 09/24/2013 15:15   Mr Brain Wo Contrast  09/24/2013   CLINICAL DATA:  Left facial droop.  Slurred speech.  EXAM: MRI HEAD WITHOUT CONTRAST  MRA HEAD WITHOUT CONTRAST  TECHNIQUE: Multiplanar, multiecho pulse sequences of the brain and surrounding structures were obtained without intravenous contrast. Angiographic images of the head were obtained using MRA technique without contrast.  COMPARISON:  Head CT same day.  MRI 05/16/2013.  FINDINGS: MRI HEAD FINDINGS  Diffusion imaging shows a small (1 x 1.5 cm) acute infarction affecting are a posterior frontal gyrus on the right, possibly the precentral gyrus. No other acute infarction. No hemorrhage or swelling.  Elsewhere, there are mild chronic small-vessel changes affecting the pons and cerebellum. The cerebral hemispheres show atrophy. There are old small vessel infarctions affecting the thalami, basal ganglia and hemispheric deep white matter. There is an old left occipital cortical infarction. No mass lesion, hemorrhage, hydrocephalus or extra-axial collection. No pituitary mass. Sinuses, middle ears and mastoids are clear.  MRA HEAD FINDINGS  Both internal carotid arteries are widely patent into the brain. No siphon stenosis. There is a 2 mm atherosclerotic aneurysm projecting medially from the carotid siphon on the left. There is mild narrowing of the supra clinoid internal carotid artery on the right. This vessel supplies only the right MCA territory. There are changes of atherosclerotic  irregularity and narrowing in the right MCA territory. The left internal carotid artery supplies the left middle cerebral artery territory, both the anterior cerebral artery territories and the left PCA. The vessels show atherosclerotic irregularity but there is no flow-limiting stenosis evident.  Both vertebral arteries are patent but show advanced atherosclerotic disease with marked irregularity and serial stenoses. The basilar artery likewise is irregular with generalized stenosis. There is an atherosclerotic aneurysm projecting posteriorly from the proximal basilar artery that measures 4 mm in diameter with a length of 7 mm. The superior cerebellar and posterior cerebral arteries are patent bilaterally, but show distal vessel atherosclerotic disease. The left PCA arises from the anterior circulation as discussed above.  IMPRESSION: No major vessel occlusion. Advanced diffuse atherosclerotic disease throughout the intracranial vessels as outlined above. There are multiple stenoses in the anterior circulation branch vessels. The posterior circulation vessels are severely disease with multiple serial stenoses and pronounced irregularity. There is an atherosclerotic aneurysm projecting posteriorly from the proximal basilar artery that measures approximately 4 x 7 mm.  1 x 1.5 cm acute infarction affecting a right posterior frontal gyrus, possibly the precentral gyrus.   Electronically Signed   By: Elta Guadeloupe  Shogry M.D.   On: 09/24/2013 15:15    Microbiology: Recent Results (from the past 240 hour(s))  MRSA PCR SCREENING     Status: None   Collection Time    09/24/13  4:00 PM      Result Value Range Status   MRSA by PCR NEGATIVE  NEGATIVE Final   Comment:            The GeneXpert MRSA Assay (FDA     approved for NASAL specimens     only), is one component of a     comprehensive MRSA colonization     surveillance program. It is not     intended to diagnose MRSA     infection nor to guide or     monitor  treatment for     MRSA infections.     Labs: Basic Metabolic Panel:  Recent Labs Lab 09/24/13 0637 09/24/13 0642 09/25/13 0400  NA 144 142 143  K 3.9 3.7 3.9  CL 106 107 105  CO2 22  --  22  GLUCOSE 117* 121* 100*  BUN 19 20 17   CREATININE 1.39* 1.50* 1.49*  CALCIUM 10.3  --  10.0   Liver Function Tests:  Recent Labs Lab 09/24/13 0637  AST 16  ALT 9  ALKPHOS 114  BILITOT 0.6  PROT 7.6  ALBUMIN 3.9   No results found for this basename: LIPASE, AMYLASE,  in the last 168 hours No results found for this basename: AMMONIA,  in the last 168 hours CBC:  Recent Labs Lab 09/24/13 0637 09/24/13 0642 09/25/13 0400  WBC 6.0  --  5.2  NEUTROABS 3.4  --   --   HGB 12.8 14.3 11.9*  HCT 38.3 42.0 36.1  MCV 97.2  --  98.1  PLT 179  --  156   Cardiac Enzymes:  Recent Labs Lab 09/24/13 0637  TROPONINI <0.30   BNP: BNP (last 3 results) No results found for this basename: PROBNP,  in the last 8760 hours CBG:  Recent Labs Lab 09/24/13 0656  GLUCAP 111*       SignedDebbe Odea, MD [facility] 09/25/2013, 4:07 PM

## 2013-09-25 NOTE — Progress Notes (Signed)
  Echocardiogram 2D Echocardiogram has been performed.  Mauricio Po 09/25/2013, 3:18 PM

## 2013-09-25 NOTE — Progress Notes (Signed)
   CARE MANAGEMENT NOTE 09/25/2013  Patient:  [name], [name]   Account Number:  [id number]  Date Initiated:  09/25/2013  Documentation initiated by:  [facility]  Subjective/Objective Assessment:   adm: slurred speech/CVA     Action/Plan:   discharge planning   Anticipated DC Date:  09/25/2013   Anticipated DC Plan:  [place]  CM consult      Choice offered to / List presented to:             Status of service:  Completed, signed off Medicare Important Message given?   (If response is "NO", the following Medicare IM given date fields will be blank) Date Medicare IM given:   Date Additional Medicare IM given:    Discharge Disposition:  HOME/SELF CARE  Per UR Regulation:    If discussed at Long Length of Stay Meetings, dates discussed:    Comments:  09/25/13 16:00 CM spoke with MD who gave telephone order for outpt speech therapy at the [facility].  CM gave grandaughter and pt map and phone number of [facility] with instruction to call Monday 09/27/13 if they had not received a call from [place] by 12:00.  Referral faxed to [facility] with facesheet; tele order/referral sheet, DC summary, and H&P.  No other CM needs were communicated.  Mariane Masters, BSN, CM [phone].

## 2013-09-26 DIAGNOSIS — I639 Cerebral infarction, unspecified: Secondary | ICD-10-CM

## 2013-09-26 HISTORY — DX: Cerebral infarction, unspecified: I63.9

## 2013-09-27 ENCOUNTER — Emergency Department (HOSPITAL_COMMUNITY)
Admission: EM | Admit: 2013-09-27 | Discharge: 2013-09-27 | Disposition: A | Discharge status: Home / Self Care | Payer: Medicare Other | Attending: Emergency Medicine | Admitting: Emergency Medicine

## 2013-09-27 ENCOUNTER — Encounter (HOSPITAL_COMMUNITY): Payer: Self-pay | Admitting: Emergency Medicine

## 2013-09-27 ENCOUNTER — Emergency Department (HOSPITAL_COMMUNITY): Payer: Medicare Other

## 2013-09-27 DIAGNOSIS — Z7982 Long term (current) use of aspirin: Secondary | ICD-10-CM | POA: Insufficient documentation

## 2013-09-27 DIAGNOSIS — I252 Old myocardial infarction: Secondary | ICD-10-CM | POA: Insufficient documentation

## 2013-09-27 DIAGNOSIS — Z7902 Long term (current) use of antithrombotics/antiplatelets: Secondary | ICD-10-CM | POA: Insufficient documentation

## 2013-09-27 DIAGNOSIS — Z8543 Personal history of malignant neoplasm of ovary: Secondary | ICD-10-CM | POA: Insufficient documentation

## 2013-09-27 DIAGNOSIS — Z9861 Coronary angioplasty status: Secondary | ICD-10-CM | POA: Insufficient documentation

## 2013-09-27 DIAGNOSIS — I2510 Atherosclerotic heart disease of native coronary artery without angina pectoris: Secondary | ICD-10-CM | POA: Insufficient documentation

## 2013-09-27 DIAGNOSIS — I10 Essential (primary) hypertension: Secondary | ICD-10-CM | POA: Insufficient documentation

## 2013-09-27 DIAGNOSIS — N189 Chronic kidney disease, unspecified: Secondary | ICD-10-CM | POA: Insufficient documentation

## 2013-09-27 DIAGNOSIS — R202 Paresthesia of skin: Secondary | ICD-10-CM

## 2013-09-27 DIAGNOSIS — M129 Arthropathy, unspecified: Secondary | ICD-10-CM | POA: Insufficient documentation

## 2013-09-27 DIAGNOSIS — R209 Unspecified disturbances of skin sensation: Secondary | ICD-10-CM | POA: Insufficient documentation

## 2013-09-27 DIAGNOSIS — Z9889 Other specified postprocedural states: Secondary | ICD-10-CM | POA: Insufficient documentation

## 2013-09-27 DIAGNOSIS — I129 Hypertensive chronic kidney disease with stage 1 through stage 4 chronic kidney disease, or unspecified chronic kidney disease: Secondary | ICD-10-CM | POA: Insufficient documentation

## 2013-09-27 DIAGNOSIS — Z79899 Other long term (current) drug therapy: Secondary | ICD-10-CM | POA: Insufficient documentation

## 2013-09-27 DIAGNOSIS — Z862 Personal history of diseases of the blood and blood-forming organs and certain disorders involving the immune mechanism: Secondary | ICD-10-CM | POA: Insufficient documentation

## 2013-09-27 DIAGNOSIS — E785 Hyperlipidemia, unspecified: Secondary | ICD-10-CM | POA: Insufficient documentation

## 2013-09-27 DIAGNOSIS — Z86718 Personal history of other venous thrombosis and embolism: Secondary | ICD-10-CM | POA: Insufficient documentation

## 2013-09-27 DIAGNOSIS — Z8673 Personal history of transient ischemic attack (TIA), and cerebral infarction without residual deficits: Secondary | ICD-10-CM | POA: Insufficient documentation

## 2013-09-27 LAB — POCT I-STAT TROPONIN I: TROPONIN I, POC: 0.05 ng/mL (ref 0.00–0.08)

## 2013-09-27 MED ORDER — CLOPIDOGREL BISULFATE 75 MG PO TABS: 75.0000 mg | ORAL_TABLET | Freq: Every day | ORAL | Status: DC

## 2013-09-27 NOTE — ED Notes (Signed)
Pt taken to CT.

## 2013-09-27 NOTE — Discharge Instructions (Signed)
Hypertension As your heart beats, it forces blood through your arteries. This force is your blood pressure. If the pressure is too high, it is called hypertension (HTN) or high blood pressure. HTN is dangerous because you may have it and not know it. High blood pressure may mean that your heart has to work harder to pump blood. Your arteries may be narrow or stiff. The extra work puts you at risk for heart disease, stroke, and other problems.  Blood pressure consists of two numbers, a higher number over a lower, 110/72, for example. It is stated as "110 over 72." The ideal is below 120 for the top number (systolic) and under 80 for the bottom (diastolic). Write down your blood pressure today. You should pay close attention to your blood pressure if you have certain conditions such as:  Heart failure.  Prior heart attack.  Diabetes  Chronic kidney disease.  Prior stroke.  Multiple risk factors for heart disease. To see if you have HTN, your blood pressure should be measured while you are seated with your arm held at the level of the heart. It should be measured at least twice. A one-time elevated blood pressure reading (especially in the Emergency Department) does not mean that you need treatment. There may be conditions in which the blood pressure is different between your right and left arms. It is important to see your caregiver soon for a recheck. Most people have essential hypertension which means that there is not a specific cause. This type of high blood pressure may be lowered by changing lifestyle factors such as:  Stress.  Smoking.  Lack of exercise.  Excessive weight.  Drug/tobacco/alcohol use.  Eating less salt. Most people do not have symptoms from high blood pressure until it has caused damage to the body. Effective treatment can often prevent, delay or reduce that damage. TREATMENT  When a cause has been identified, treatment for high blood pressure is directed at the  cause. There are a large number of medications to treat HTN. These fall into several categories, and your caregiver will help you select the medicines that are best for you. Medications may have side effects. You should review side effects with your caregiver. If your blood pressure stays high after you have made lifestyle changes or started on medicines,   Your medication(s) may need to be changed.  Other problems may need to be addressed.  Be certain you understand your prescriptions, and know how and when to take your medicine.  Be sure to follow up with your caregiver within the time frame advised (usually within two weeks) to have your blood pressure rechecked and to review your medications.  If you are taking more than one medicine to lower your blood pressure, make sure you know how and at what times they should be taken. Taking two medicines at the same time can result in blood pressure that is too low. SEEK IMMEDIATE MEDICAL CARE IF:  You develop a severe headache, blurred or changing vision, or confusion.  You have unusual weakness or numbness, or a faint feeling.  You have severe chest or abdominal pain, vomiting, or breathing problems. MAKE SURE YOU:   Understand these instructions.  Will watch your condition.  Will get help right away if you are not doing well or get worse. Document Released: 08/12/2005 Document Revised: 11/04/2011 Document Reviewed: 04/01/2008 [facility] Patient Information 2014 [place]. Transient Ischemic Attack A transient ischemic attack (TIA) is a "warning stroke" that causes stroke-like symptoms.  Unlike a stroke, a TIA does not cause permanent damage to the brain. The symptoms of a TIA can happen very fast and do not last long. It is important to know the symptoms of a TIA and what to do. This can help prevent a major stroke or death. CAUSES   A TIA is caused by a temporary blockage in an artery in the brain or neck (carotid artery). The  blockage does not allow the brain to get the blood supply it needs and can cause different symptoms. The blockage can be caused by either:  A blood clot.  Fatty buildup (plaque) in a neck or brain artery. RISK FACTORS  High blood pressure (hypertension).  High cholesterol.  Diabetes mellitus.  Heart disease.  The build up of plaque in the blood vessels (peripheral artery disease or atherosclerosis).  The build up of plaque in the blood vessels providing blood and oxygen to the brain (carotid artery stenosis).  An abnormal heart rhythm (atrial fibrillation).  Obesity.  Smoking.  Taking oral contraceptives (especially in combination with smoking).  Physical inactivity.  A diet high in fats, salt (sodium), and calories.  Alcohol use.  Use of illegal drugs (especially cocaine and methamphetamine).  Being female.  Being African American.  Being over the age of 78.  Family history of stroke.  Previous history of blood clots, stroke, TIA, or heart attack.  Sickle cell disease. SYMPTOMS  TIA symptoms are the same as a stroke but are temporary. These symptoms usually develop suddenly, or may be newly present upon awakening from sleep:  Sudden weakness or numbness of the face, arm, or leg, especially on one side of the body.  Sudden trouble walking or difficulty moving arms or legs.  Sudden confusion.  Sudden personality changes.  Trouble speaking (aphasia) or understanding.  Difficulty swallowing.  Sudden trouble seeing in one or both eyes.  Double vision.  Dizziness.  Loss of balance or coordination.  Sudden severe headache with no known cause.  Trouble reading or writing.  Loss of bowel or bladder control.  Loss of consciousness. DIAGNOSIS  Your caregiver may be able to determine the presence or absence of a TIA based on your symptoms, history, and physical exam. Computed tomography (CT scan) of the brain is usually performed to help identify a TIA.  Other tests may be done to diagnose a TIA. These tests may include:  Electrocardiography.  Continuous heart monitoring.  Echocardiography.  Carotid ultrasonography.  Magnetic resonance imaging (MRI).  A scan of the brain circulation.  Blood tests. PREVENTION  The risk of a TIA can be decreased by appropriately treating high blood pressure, high cholesterol, diabetes, heart disease, and obesity and by quitting smoking, limiting alcohol, and staying physically active. TREATMENT  Time is of the essence. Since the symptoms of TIA are the same as a stroke, it is important to seek treatment within 3 4 hours of the start of symptoms because you may receive a medicine to dissolve the clot (thrombolytic) that cannot be given after that time. Treatment options vary. Treatment options may include rest, oxygen, intravenous (IV) fluids, and medicines to thin the blood (anticoagulants). Medicines and diet may be used to address diabetes, high blood pressure, and other risk factors. Measures will be taken to prevent short-term and long-term complications, including infection from breathing foreign material into the lungs (aspiration pneumonia), blood clots in the legs, and falls. Treatment options include procedures to either remove plaque in the carotid arteries or dilate carotid arteries that  have narrowed due to plaque. Those procedures are:  Carotid endarterectomy.  Carotid angioplasty and stenting. HOME CARE INSTRUCTIONS   Take all medicines prescribed by your caregiver. Follow the directions carefully. Medicines may be used to control risk factors for a stroke. Be sure you understand all your medicine instructions.  You may be told to take aspirin or the anticoagulant warfarin. Warfarin needs to be taken exactly as instructed.  Taking too much or too little warfarin is dangerous. Too much warfarin increases the risk of bleeding. Too little warfarin continues to allow the risk for blood clots.  While taking warfarin, you will need to have regular blood tests to measure your blood clotting time. A PT blood test measures how long it takes for blood to clot. Your PT is used to calculate another value called an INR. Your PT and INR help your caregiver to adjust your dose of warfarin. The dose can change for many reasons. It is critically important that you take warfarin exactly as prescribed.  Many foods, especially foods high in vitamin K can interfere with warfarin and affect the PT and INR. Foods high in vitamin K include spinach, kale, broccoli, cabbage, collard and turnip greens, brussels sprouts, peas, cauliflower, seaweed, and parsley as well as beef and pork liver, green tea, and soybean oil. You should eat a consistent amount of foods high in vitamin K. Avoid major changes in your diet, or notify your caregiver before changing your diet. Arrange a visit with a dietitian to answer your questions.  Many medicines can interfere with warfarin and affect the PT and INR. You must tell your caregiver about any and all medicines you take, this includes all vitamins and supplements. Be especially cautious with aspirin and anti-inflammatory medicines. Do not take or discontinue any prescribed or over-the-counter medicine except on the advice of your caregiver or pharmacist.  Warfarin can have side effects, such as excessive bruising or bleeding. You will need to hold pressure over cuts for longer than usual. Your caregiver or pharmacist will discuss other potential side effects.  Avoid sports or activities that may cause injury or bleeding.  Be mindful when shaving, flossing your teeth, or handling sharp objects.  Alcohol can change the body's ability to handle warfarin. It is best to avoid alcoholic drinks or consume only very small amounts while taking warfarin. Notify your caregiver if you change your alcohol intake.  Notify your dentist or other caregivers before procedures.  Eat a diet that  includes 5 or more servings of fruits and vegetables each day. This may reduce the risk of stroke. Certain diets may be prescribed to address high blood pressure, high cholesterol, diabetes, or obesity.  A low-sodium, low-saturated fat, low-trans fat, low-cholesterol diet is recommended to manage high blood pressure.  A low-saturated fat, low-trans fat, low-cholesterol, and high-fiber diet may control cholesterol levels.  A controlled-carbohydrate, controlled-sugar diet is recommended to manage diabetes.  A reduced-calorie, low-sodium, low-saturated fat, low-trans fat, low-cholesterol diet is recommended to manage obesity.  Maintain a healthy weight.  Stay physically active. It is recommended that you get at least 30 minutes of activity on most or all days.  Do not smoke.  Limit alcohol use even if you are not taking warfarin. Moderate alcohol use is considered to be:  No more than 2 drinks each day for men.  No more than 1 drink each day for nonpregnant women.  Stop drug abuse.  Home safety. A safe home environment is important to reduce the risk  of falls. Your caregiver may arrange for specialists to evaluate your home. Having grab bars in the bedroom and bathroom is often important. Your caregiver may arrange for equipment to be used at home, such as raised toilets and a seat for the shower.  Follow all instructions for follow-up with your caregiver. This is very important. This includes any referrals and lab tests. Proper follow up can prevent a stroke or another TIA from occurring. SEEK MEDICAL CARE IF:  You have personality changes.  You have difficulty swallowing.  You are seeing double.  You have dizziness.  You have a fever.  You have skin breakdown. SEEK IMMEDIATE MEDICAL CARE IF:  Any of these symptoms may represent a serious problem that is an emergency. Do not wait to see if the symptoms will go away. Get medical help right away. Call your local emergency  services (911 in [place] Do not drive yourself to the hospital.  You have sudden weakness or numbness of the face, arm, or leg, especially on one side of the body.  You have sudden trouble walking or difficulty moving arms or legs.  You have sudden confusion.  You have trouble speaking (aphasia) or understanding.  You have sudden trouble seeing in one or both eyes.  You have a loss of balance or coordination.  You have a sudden, severe headache with no known cause.  You have new chest pain or an irregular heartbeat.  You have a partial or total loss of consciousness. MAKE SURE YOU:   Understand these instructions.  Will watch your condition.  Will get help right away if you are not doing well or get worse. Document Released: 05/22/2005 Document Revised: 07/29/2012 Document Reviewed: 10/05/2009 [facility] Patient Information 2014 [place], [place].

## 2013-09-27 NOTE — ED Notes (Addendum)
Pt back from CT, pts daughter at bedside

## 2013-09-27 NOTE — ED Notes (Signed)
EKG given to Dr Dina Rich - orders received.

## 2013-09-27 NOTE — ED Provider Notes (Signed)
CSN: [id number]     Arrival date & time 09/27/13  0223 History   First MD Initiated Contact with Patient 09/27/13 0235     Chief Complaint  Patient presents with  . Tingling   (Consider location/radiation/quality/duration/timing/severity/associated sxs/prior Treatment) HPI  Patient with recent history of stroke presents with tingling of the left face. Patient states that she was diagnosed with a stroke and discharged on Sunday. She is on aspirin daily. Patient reports onset of left facial tingling at 11 PM. She stated that the tingling went from her left face throughout her entire body. At the time of my interview, patient's tingling had resolved. She denies any weakness, difficulty speaking, facial droop or any other neurologic symptoms. Patient states that she was told to be evaluated if she had any recurrence of symptoms. The tingling is similar to when she had her stroke. At that time she also was noted to have left facial droop and dysarthria. Patient denies any chest pain or shortness of breath.  Past Medical History  Diagnosis Date  . Peripheral vascular disease 2010    Known distal left SFA occlusion, known right posterior tibial artery occlusion as well as left posterior and anterior tibial artery occlusion.; Most recent Dopplers November 2012: Patent right SFA stent, left ABI 0.56  . Coronary artery disease - history of MI October 2006    Severe RCA lesion -- 2.5 Mm x 12 Mm Vision BMS; January 2007 -. Distal stent stenosis in RCA treated with 2.5 mm x y 12 mm Vision BMS stent distally;  circumflex/OM stenosis -- 2.5 mg 12 mm Mini Vision BMS ;  most recent cath October 11: 100% RCA occlusion with ISR. Left right collaterals. Patent to the extent, the 70% AV groove circumflex. Apical LAD stenosis 70-80 %, diffuse disease; EF 65-70%   . Aortic valve stenosis, senile calcific January 2011    Echo: Normal EF 60-65%, mild aortic stenosis - estimated valve area 1.1 cm -- recently followed up  with new echocardiogram  . Chronic kidney disease 1964  . Hypertension     Previously on multiple medications now on only clonidine  . Stroke     Carotid artery Doppler show right internal carotid 60-80% stenosis. Previously followed by Dr. Donzetta Kohut  . Non-ST elevation MI (NSTEMI) 2006, 2011  . DVT (deep venous thrombosis)   . Aneurysm   . Anemia   . Cancer 1963    ovarian cancer  . Arthritis   . Bilateral renal artery stenosis 2006 Deon Pilling)    - Right renal artery 60% stenosis March '13  . Dyslipidemia, goal LDL below 70      no longer on statin  . Cerebral aneurysm without rupture     3 mm; followed by Dr. Arnoldo Morale as well as vascular surgery   Past Surgical History  Procedure Laterality Date  . Abdominal hysterectomy    . Bladder surgery      bladder tack  . Breast lumpectomy      left  . Appendectomy    . Femoral artery stent Right 2006, 2011, 2012    Atherectomy with PTA alone in 2006; April 2000: Right SFA - overlapping stents (6.0 mm x 170 mm x2 and 6.0 mm x 40 mm); [place] Balloon PTA of right SFA ISR -- patent by Dopplers and cath in 2012  . Renal artery stent Bilateral 2006    Dr. Deon Pilling; patent as of 2012 cath  . Iliac artery stent Right 2006  . Popliteal artery angioplasty  July 04, 2011    Done in the same setting as PTA of ISR in right SFA  . Doppler echocardiography  09/25/2009    ef 60 to 65%,aotric valve mild stenosis 1.1cm^2(VTI)  . Nm myocar perf wall motion  10/13/2009    EF 73%  . Coronary angioplasty with stent placement  05/27/2005    RCA - MiniVision BMS 2.5 mm x 12 mm  . Coronary angioplasty with stent placement Bilateral 01/23/20072007    Circumflex-OM -- 2.5 mm x 12 mm Mini Vision BMS; RCA distal to previous stent 2.5 mm x 12 mm Mini Vision BMS  . Cardiac catheterization  05/28/2010    EF 65-70%,multi vessel CAD  . Carotid doppler  02/04/2012    Done at [facility]----right internal 60 to 79%,bilateral external patent,bilateral common carotid artery  tortuosity present  . Renal doppler  11/13/2011    bil.renal arterial stents patent w/less than 60%,right and left kidneys equal in size  . Lower arterial extrem. doppler  07/23/2011    ABI RIGHT .92 ,LEFT ABI .30 ,RGT PROX.SFA STENT 0-49%,RGT SFA  W/O EVIDENCE OF SIGNIFICANT DIAMETER REDUCTION,RGT POPLITEAL ARTERY 50% NARROWING    Family History  Problem Relation Age of Onset  . Irritable bowel syndrome Daughter   . Colon cancer Neg Hx   . Cancer Mother     Ovarian  . Heart attack Father    History  Substance Use Topics  . Smoking status: Never Smoker   . Smokeless tobacco: Never Used  . Alcohol Use: No     Comment: social drinker  years ago-no longer   OB History   Grav Para Term Preterm Abortions TAB SAB Ect Mult Living                 Review of Systems  Constitutional: Negative for fever.  Respiratory: Negative for cough, chest tightness and shortness of breath.   Cardiovascular: Negative for chest pain.  Gastrointestinal: Negative for nausea, vomiting and abdominal pain.  Genitourinary: Negative for dysuria.  Musculoskeletal: Negative for back pain.  Skin: Negative for wound.  Neurological: Negative for dizziness, speech difficulty, weakness, numbness and headaches.       Left facial tingling  Psychiatric/Behavioral: Negative for confusion.  All other systems reviewed and are negative.    Allergies  Codeine  Home Medications   Current Outpatient Rx  Name  Route  Sig  Dispense  Refill  . acetaminophen (TYLENOL) 500 MG tablet   Oral   Take 500 mg by mouth as needed for pain.         Marland Kitchen aspirin 81 MG EC tablet   Oral   Take 1 tablet (81 mg total) by mouth daily. Swallow whole.   30 tablet   12   . atorvastatin (LIPITOR) 10 MG tablet   Oral   Take 10 mg by mouth every other day.         . carvedilol (COREG) 3.125 MG tablet   Oral   Take 3.125 mg by mouth daily.         Marland Kitchen Propylene Glycol-Glycerin (SOOTHE OP)   Both Eyes   Place 1 drop into both  eyes daily.          . clopidogrel (PLAVIX) 75 MG tablet   Oral   Take 1 tablet (75 mg total) by mouth daily with breakfast.   30 tablet   0    BP 172/104  Pulse 70  Temp(Src) 98 F (36.7 C) (Oral)  Resp 15  SpO2 98% Physical Exam  Nursing note and vitals reviewed. Constitutional: She is oriented to person, place, and time. No distress.  Elderly  HENT:  Head: Normocephalic and atraumatic.  Eyes: Pupils are equal, round, and reactive to light.  Neck: Neck supple. JVD present.  Cardiovascular: Normal rate, regular rhythm and normal heart sounds.   No murmur heard. Pulmonary/Chest: Effort normal and breath sounds normal. No respiratory distress. She has no wheezes.  Abdominal: Soft. Bowel sounds are normal. There is no tenderness.  Musculoskeletal: She exhibits no edema.  Neurological: She is alert and oriented to person, place, and time.  Nasolabial fold flattening on the left, subtle left facial droop, otherwise cranial nerves intact, fluid speech, 5 out of 5 strength in all 4 extremities, no dysmetria to finger-nose-finger, sensation intact to light touch  Skin: Skin is warm and dry.  Psychiatric: She has a normal mood and affect.    ED Course  Procedures (including critical care time) Labs Review Labs Reviewed  POCT I-STAT TROPONIN I   Imaging Review Ct Head Wo Contrast  09/27/2013   CLINICAL DATA:  Recent stroke, left facial droop with slurred speech, now with body tingling.  EXAM: CT HEAD WITHOUT CONTRAST  TECHNIQUE: Contiguous axial images were obtained from the base of the skull through the vertex without intravenous contrast.  COMPARISON:  CT of the head September 24, 2013 and MRI of the brain September 24, 2013  FINDINGS: The ventricles and sulci are normal for age. No intraparenchymal hemorrhage, mass effect nor midline shift. Patchy supratentorial white matter hypodensities are within normal range for patient's age and though non-specific suggest sequelae of chronic  small vessel ischemic disease. No acute large vascular territory infarcts. Small right posterior frontal cortical to subcortical infarct corresponding to the MRI abnormality. Left occipital encephalomalacia .  No abnormal extra-axial fluid collections. Basal cisterns are patent. Severe calcific atherosclerosis of the carotid siphons and included vertebral arteries.  No skull fracture. Visualized paranasal sinuses and mastoid aircells are well-aerated. The included ocular globes and orbital contents are non-suspicious.  IMPRESSION: Evolving small right posterior frontal infarct (corresponding to recent MRI abnormality), without acute intracranial process.  Left occipital encephalomalacia may reflect remote posterior cerebral artery territory infarct or trauma. Moderate white matter changes suggest chronic small vessel ischemic disease.   Electronically Signed   By: Elon Alas   On: 09/27/2013 03:38    EKG Interpretation   None       MDM   1. Paresthesia   2. Hypertension    Patient presents with tingling of the left face. Symptoms have resolved prior to my evaluation. Recent history of stroke and on daily aspirin.  Patient noted to be hypertensive. States that she took her blood pressure medication at 10 PM. Neurologic exam shows residual left facial droop. Otherwise exam is reassuring. Discussed the patient with Dr. Doy Mince. Dr. Doy Mince recommends CT head to rule out bleed. Given that she just had a full workup, Dr. Doy Mince would add Plavix to her stroke prevention regimen. CT head is negative. Patient did have one episode while in the ER and palpitations. She denies chest pain. EKG is reassuring. Troponin is negative.   Discussed the plan with patient and her granddaughter.  After history, exam, and medical workup I feel the patient has been appropriately medically screened and is safe for discharge home. Pertinent diagnoses were discussed with the patient. Patient was given return  precautions.     Merryl Hacker, MD 09/27/13 [phone]

## 2013-09-27 NOTE — ED Provider Notes (Signed)
80-year-old female, history of vascular disease including multiple myocardial infarctions and a stroke who presents with a complaint of high blood pressure. According to the patient's blood pressure has been gradually increasing but noticed a spike last night, it was as high as 260 as measured by the paramedics. No medications given prior to arrival, the patient does note associated diffuse paresthesias both upper and lower extremities as well as facial droop that she noticed in the mirror last night greater than 8 hours prior to arrival.  On exam the patient is hypertensive, she has normal strength in all 4 extremities, cranial nerves III through XII are intact except for slight facial droop, peripheral visual fields are normal, extraocular movements are intact, the patient will need acute treatment for blood pressure and evaluation for recurrent stroke.  Medical screening examination/treatment/procedure(s) were conducted as a shared visit with non-physician practitioner(s) and myself.  I personally evaluated the patient during the encounter.  Clinical Impression: severe hypertension      Tamas Suen D Evvie Behrmann, MD 09/27/13 1154 

## 2013-09-27 NOTE — ED Notes (Signed)
Pt arrives via EMS from home. States she was released from hospital on Saturday after being here for CVA. Left sided facial droop and slurred speech from CVA. States at 11p a tingling started in her back, states that the tingling has moved throughout her body.

## 2013-09-27 NOTE — ED Notes (Signed)
EDP in room  

## 2013-09-28 ENCOUNTER — Ambulatory Visit (INDEPENDENT_AMBULATORY_CARE_PROVIDER_SITE_OTHER): Payer: Medicare Other | Admitting: Cardiology

## 2013-09-28 ENCOUNTER — Encounter: Payer: Self-pay | Admitting: Cardiology

## 2013-09-28 VITALS — BP 210/96 | HR 82 | Ht 64.0 in | Wt 149.8 lb

## 2013-09-28 DIAGNOSIS — I350 Nonrheumatic aortic (valve) stenosis: Secondary | ICD-10-CM

## 2013-09-28 DIAGNOSIS — I359 Nonrheumatic aortic valve disorder, unspecified: Secondary | ICD-10-CM

## 2013-09-28 DIAGNOSIS — E785 Hyperlipidemia, unspecified: Secondary | ICD-10-CM

## 2013-09-28 DIAGNOSIS — I10 Essential (primary) hypertension: Secondary | ICD-10-CM

## 2013-09-28 DIAGNOSIS — I739 Peripheral vascular disease, unspecified: Secondary | ICD-10-CM

## 2013-09-28 DIAGNOSIS — I2510 Atherosclerotic heart disease of native coronary artery without angina pectoris: Secondary | ICD-10-CM

## 2013-09-28 DIAGNOSIS — I63239 Cerebral infarction due to unspecified occlusion or stenosis of unspecified carotid arteries: Secondary | ICD-10-CM

## 2013-09-28 MED ORDER — AMLODIPINE BESYLATE 2.5 MG PO TABS: 2.5000 mg | ORAL_TABLET | Freq: Every day | ORAL | Status: DC

## 2013-09-28 NOTE — Progress Notes (Signed)
Patient ID: [name], female   DOB: [birth year], 80 y.o.   MRN: [id number]  Clinic Note: HPI: [name] is a 80 y.o. female with an extensive past medical history of coronary artery and peripheral vascular as well as carotid artery disease as outlined below.  She presents today for hospital followup from a stroke has left her with a Left facial droop. She was admitted on January 30 when she presented what amounted be the following day after an episode difficulties and slurred speech. Is associated with generalized tingling, but she went to bed the symptoms,  And did not go to the emergency room until they were still present upon awakening. She was profoundly hypertensive upon initial evaluation with blood pressures in the 260/160 range. Head CT was negative. MRI demonstrated right posterior frontal gyrus infarction. Echocardiogram was performed as noted below. No change. Interestingly, she does have carotid Dopplers done by Dr. Kellie Simmering from vascular surgery they were relatively stable.  Since last seen her she has seen both Dr. Gwenlyn Found for peripheral vascular disease and Dr. Kellie Simmering for carotid artery disease.  Interval History:   Since her discharge, she has not had any significant symptoms. Her neurologic symptoms seem to stabilize according to her. From a cardiac standpoint she denies any chest tightness or pressure/dyspnea with rest or exertion. She is not all that mobile, limited mostly by her right knee. She denies any irregular heartbeats/rapid heart beats or palpitations. No new sensation of slurred speech although she does note some drooling there is actually off her right side of her mouth.  She denies any syncope or near-syncope. No melena, hematochezia or hematuria. No new strokelike findings.  She does note intermittent headaches, and to her vascular surgeon, she noted some mild ectasia type symptoms. But none since her discharge.  Also, importantly, she has not been  taking her carvedilol routinely even once a day.  Past Medical History  Diagnosis Date  . Peripheral vascular disease 2010    Known distal left SFA occlusion, known right posterior tibial artery occlusion as well as left posterior and anterior tibial artery occlusion.; Most recent Dopplers November 2012: Patent right SFA stent, left ABI 0.56  . Coronary artery disease - history of MI October 2006    Severe RCA lesion -- 2.5 Mm x 12 Mm Vision BMS; January 2007 -. Distal stent stenosis in RCA treated with 2.5 mm x y 12 mm Vision BMS stent distally;  circumflex/OM stenosis -- 2.5 mg 12 mm Mini Vision BMS ;  most recent cath October 11: 100% RCA occlusion with ISR. Left right collaterals. Patent to the extent, the 70% AV groove circumflex. Apical LAD stenosis 70-80 %, diffuse disease; EF 65-70%   . Aortic valve stenosis, senile calcific January 2011    Echo: Normal EF 60-65%, mild aortic stenosis - estimated valve area 1.1 cm -- recently followed up with new echocardiogram  . Chronic kidney disease 1964  . Hypertension     Previously on multiple medications now on only clonidine  . Stroke     Carotid artery Doppler show right internal carotid 60-80% stenosis. Previously followed by Dr. Donzetta Kohut  . Non-ST elevation MI (NSTEMI) 2006, 2011  . DVT (deep venous thrombosis)   . Aneurysm   . Anemia   . Cancer 1963    ovarian cancer  . Arthritis   . Bilateral renal artery stenosis 2006 Deon Pilling)    - Right renal artery 60% stenosis March '13  . Dyslipidemia,  goal LDL below 70      no longer on statin  . Cerebral aneurysm without rupture     3 mm; followed by Dr. Arnoldo Morale as well as vascular surgery    Most Recent Cardiac Evaluation:  Echocardiogram 09/25/2013:   Mild concentric LVH. EF 60-65%. No regional wall motion abnormalities. Mild-moderate aortic stenosis. Mild mitral stenosis. Mild left atrial dilation. Mildly elevated PA pressures.  Echocardiogram 01/20/2013:   Severe concentric  hypertrophy. EF 65-70%. Grade 1 diastolic dysfunction but elevated but the left atrial pressures.  Aortic valve: Heavily calcified and restricted motion. Moderate stenosis with trivial regurgitation.   Valve area estimated 1.33 cm.  Peak and mean gradients: 32 mmHg and 23 mmHg  Moderately calcified mitral valve annulus. Mild mitral stenosis.  Peak and mean pressure gradients: 13 mmHg and 4 mmHg  Normal left atrial size.  Peak PA pressures: 45 mmHg   Carotid Dopplers 12/22/12: Right internal carotid: 60-79% stenosis, left internal carotid < 40% -- no significant change in 09/21/2013  Nuclear stress test February 2011: No evidence of ischemia. EF 73%.   Past Surgical History  Procedure Laterality Date  . Abdominal hysterectomy    . Bladder surgery      bladder tack  . Breast lumpectomy      left  . Appendectomy    . Femoral artery stent Right 2006, 2011, 2012    Atherectomy with PTA alone in 2006; April 2000: Right SFA - overlapping stents (6.0 mm x 170 mm x2 and 6.0 mm x 40 mm); [place] PTA of right SFA ISR -- patent by Dopplers and cath in 2012  . Renal artery stent Bilateral 2006    Dr. Deon Pilling; patent as of 2012 cath  . Iliac artery stent Right 2006  . Popliteal artery angioplasty  July 04, 2011    Done in the same setting as PTA of ISR in right SFA  . Doppler echocardiography  09/25/2009    ef 60 to 65%,aotric valve mild stenosis 1.1cm^2([facility])  . Nm myocar perf wall motion  10/13/2009    EF 73%  . Coronary angioplasty with stent placement  05/27/2005    RCA - MiniVision BMS 2.5 mm x 12 mm  . Coronary angioplasty with stent placement Bilateral 01/23/20072007    Circumflex-OM -- 2.5 mm x 12 mm Mini Vision BMS; RCA distal to previous stent 2.5 mm x 12 mm Mini Vision BMS  . Cardiac catheterization  05/28/2010    EF 65-70%,multi vessel CAD  . Carotid doppler  02/04/2012; January 2015    Done at [facility]----right internal 60 to 79%,bilateral external patent,bilateral  common carotid artery tortuosity present  . Renal doppler  11/13/2011    bil.renal arterial stents patent w/less than 60%,right and left kidneys equal in size  . Lower arterial extrem. doppler  07/23/2011    ABI RIGHT .92 ,LEFT ABI .49 ,RGT PROX.SFA STENT 0-49%,RGT SFA  W/O EVIDENCE OF SIGNIFICANT DIAMETER REDUCTION,RGT POPLITEAL ARTERY 50% NARROWING   . Transthoracic echocardiogram  September 25 2013    Mild concentric LVH ;; EF 60-65%. Increased left atrial pressure. Mild-moderate aortic stenosis with trivial regurgitation. Severe MAC with mild stenosis. Mild LA dilation. PA pressures mildly elevated at 42 mmHg.    Allergies  Allergen Reactions  . Codeine Rash    Current Outpatient Prescriptions  Medication Sig Dispense Refill  . acetaminophen (TYLENOL) 500 MG tablet Take 500 mg by mouth as needed for pain.      Marland Kitchen aspirin 81 MG EC tablet  Take 1 tablet (81 mg total) by mouth daily. Swallow whole.  30 tablet  12  . atorvastatin (LIPITOR) 10 MG tablet Take 10 mg by mouth every other day.      . carvedilol (COREG) 3.125 MG tablet Take 3.125 mg by mouth daily.      . clopidogrel (PLAVIX) 75 MG tablet Take 1 tablet (75 mg total) by mouth daily with breakfast.  30 tablet  0  . Propylene Glycol-Glycerin (SOOTHE OP) Place 1 drop into both eyes daily.       Marland Kitchen amLODipine (NORVASC) 2.5 MG tablet Take 1 tablet (2.5 mg total) by mouth daily.  30 tablet  11   No current facility-administered medications for this visit.  ** Coreg and aspirin restarted today not current medications. Was previously on 12.5 twice Coreg and 81 mg aspirin a day prior to her hospitalization.  Social History:  She is a widowed mother of 34. She has 32 grandchildren and great-grandchildren combined. She is very active. She reports that she has never smoked. She has never used smokeless tobacco. She reports that she does not drink alcohol or use illicit drugs.  She has not had a drink in over 30 years.  ROS: A comprehensive  Review of Systems - Musculoskeletal ROS: positive for - Right knee pain Neurological ROS: positive for - gait disturbance, impaired coordination/balance and Drooling from the right side of her mouth, recent stroke.  PHYSICAL EXAM BP 210/96  Pulse 82  Ht 5\' 4"  (1.626 m)  Wt 149 lb 12.8 oz (67.949 kg)  BMI 25.70 kg/m2 General appearance: A& O.x3, cooperative, appears stated age, no distress. Very pleasant elderly African American woman. Answers questions appropriately. Well-nourished and well-groomed. Neck: no adenopathy, no JVD, supple, symmetrical, trachea midline, thyroid not enlarged, symmetric, no tenderness/mass/nodules and Faint right-sided carotid bruit Lungs: clear to auscultation bilaterally, normal percussion bilaterally and Normal effort, good air movement. Heart: regular rate and rhythm, S1, S2 normal, no S3 or S4, systolic murmur: systolic ejection 3/6, crescendo, decrescendo and harsh at 2nd right intercostal space, radiates to carotids and no rub Abdomen: soft, non-tender; bowel sounds normal; no masses,  no organomegaly and Soft bruit noted in the midline , and more to the right; no femoral bruit noted Extremities: extremities normal, atraumatic, no cyanosis or edema, no edema, redness or tenderness in the calves or thighs and no ulcers, gangrene or trophic changes Pulses: Diminished pedal pulses mostly on the left barely palpable. Posterior tables of the right are bounding the week DP pulses in the right. Bounding radial pulses bilaterally. Neurologic: Mental status: Alert, oriented, thought content appropriate Cranial nerves: normal HEENT: Mild arcus senilis, [place]/AP, EOMI, MMM, anicteric sclera and noninjected conjunctiva. On my examination, she actually has a right-sided facial droop as opposed to the left-sided facial droop described in the hospital. To her this was the same as it was in the hospital.  [id number] today:  Adult ECG Report   Rate: 82;  Rhythm: normal sinus  rhythm, PVC; nonspecific ST-T changes.  Other Abnormalities: none   Narrative Interpretation: Mildly abnormal EKG with PVCs and nonspecific ST-T changes. No notable changes from previous EKG.  ASSESSMENT/PLAN: Her family hypertensive today it on examination, not having taken her medications. She is only 4 days post stroke. I am concerned that I am seeing a right-sided facial droop as well as left-sided facial droop.  HTN (hypertension), malignant - Plan: EKG 12-Lead, amLODipine (NORVASC) 2.5 MG tablet  Coronary artery disease - Plan: EKG 12-Lead  Occlusion  and stenosis of carotid artery with history of cerebral infarction  Aortic valve stenosis, senile calcific  Peripheral vascular disease  Dyslipidemia, goal LDL below 70    HTN (hypertension), malignant Very concerning pressures today. Thankfully she is notably symptomatic. Plan: Take additional 3.125 mg of Coreg for total of 6.25 mg tonight to we'll also start 2.5 mg of amlodipine this evening. I admonished her to make sure she takes her carvedilol twice a day for fear of rebound effect if she does not. She is to go to the nearby [facility] to have her blood pressure checked tomorrow. I want her to get her blood pressure checked when she goes for physical therapy on Thursday. She will followup with Tommy Medal here on Friday for blood pressure reevaluation and potential titration of either carvedilol or amlodipine, however amlodipine may be preferable to avoid rebound effect.  Occlusion and stenosis of carotid artery with history of cerebral infarction She is due to go back and see Dr. Kellie Simmering in short order. Question if now with symptomatic aortic stenosis she would potentially be a candidate for either carotid stenting or carotid endarterectomy.Burnis Medin need to have blood pressure control prior to either operation/ procedure.  Coronary artery disease Currently stable. She is now on Plavix for both cardiac and  cerebrovascular affect. She is on statin and beta blocker.  Aortic valve stenosis, senile calcific Current EKG seems to be little or worsening. I think this can be followed up annually.  Peripheral vascular disease Followed by Dr. Gwenlyn Found - 4 bilateral renal artery stenting and right SFA stenting in 2010-12.  She denied lifestyle limiting claudication. Therefore be treated medically.  Dyslipidemia, goal LDL below 70 Back on atorvastatin 10 mg daily. As this was a relatively recent restart, would wait at least 3 months to evaluate with a lipid panel.    Orders Placed This Encounter  Procedures  . EKG 12-Lead   Meds ordered this encounter  Medications  . amLODipine (NORVASC) 2.5 MG tablet    Sig: Take 1 tablet (2.5 mg total) by mouth daily.    Dispense:  30 tablet    Refill:  11    Followup:  Tommy Medal - 3 days; then potentially either every week or other week; followup with me in one month  HARDING,DAVID W, M.D., M.S. [facility] [address] [place], [place]  [id number]  [phone] Pager # [phone] 09/28/2013 7:46 PM

## 2013-09-28 NOTE — Assessment & Plan Note (Signed)
Back on atorvastatin 10 mg daily. As this was a relatively recent restart, would wait at least 3 months to evaluate with a lipid panel.

## 2013-09-28 NOTE — Assessment & Plan Note (Signed)
Very concerning pressures today. Thankfully she is notably symptomatic. Plan: Take additional 3.125 mg of Coreg for total of 6.25 mg tonight to we'll also start 2.5 mg of amlodipine this evening. I admonished her to make sure she takes her carvedilol twice a day for fear of rebound effect if she does not. She is to go to the nearby [facility] to have her blood pressure checked tomorrow. I want her to get her blood pressure checked when she goes for physical therapy on Thursday. She will followup with Tommy Medal here on Friday for blood pressure reevaluation and potential titration of either carvedilol or amlodipine, however amlodipine may be preferable to avoid rebound effect.

## 2013-09-28 NOTE — Assessment & Plan Note (Signed)
Current EKG seems to be little or worsening. I think this can be followed up annually.

## 2013-09-28 NOTE — Assessment & Plan Note (Signed)
Followed by Dr. Gwenlyn Found - 4 bilateral renal artery stenting and right SFA stenting in 2010-12.  She denied lifestyle limiting claudication. Therefore be treated medically.

## 2013-09-28 NOTE — Patient Instructions (Addendum)
Your blood pressure is too high today --  This afternoon - take 2 of the Carvedilol pills (your Blood pressure medicine) --> take the new medicine (Amlodipine) before bed.  Starting tomorrow - take the Carvedilol 2 x daily; then Amlodipine before bed.  Please go to [facility]  Have blood pressure call and let the office know.   Your physician recommends that you schedule a follow-up appointment in see [name] this Friday  Your physician wants you to follow-up in 1 month Dr Ellyn Hack.  You will receive a reminder letter in the mail two months in advance. If you don't receive a letter, please call our office to schedule the follow-up appointment.

## 2013-09-28 NOTE — Assessment & Plan Note (Signed)
She is due to go back and see Dr. Kellie Simmering in short order. Question if now with symptomatic aortic stenosis she would potentially be a candidate for either carotid stenting or carotid endarterectomy.[name] need to have blood pressure control prior to either operation/ procedure.

## 2013-09-28 NOTE — Assessment & Plan Note (Signed)
Currently stable. She is now on Plavix for both cardiac and cerebrovascular affect. She is on statin and beta blocker.

## 2013-09-29 ENCOUNTER — Telehealth: Payer: Self-pay | Admitting: Cardiology

## 2013-09-29 NOTE — Telephone Encounter (Signed)
Spoke to patient. She states she found her home blood pressure machine. She would like to monitor it and if still up come to office on Friday 10/01/13 with Erasmo Downer. RN informed patient she had to come to the appointment -she verbalized understanding and will come with blood pressure and  Medication  RN informed patient to take 2 carvedilol tablets in the morning and 2 carvedilol in the evening , along with Amlodipine at bedtime. She repeated instruction and verbalized understanding.  Erasmo Downer made aware of this telephone message.

## 2013-09-29 NOTE — Telephone Encounter (Signed)
Tell her that she needs to take the Carvedilol 2 tabs 2x daily -- can increase Rx to 6.25 mg.  Leonie Man, MD

## 2013-09-29 NOTE — Telephone Encounter (Signed)
Mrs.Sanden called back and said that it is 207/104   Thanks

## 2013-09-29 NOTE — Telephone Encounter (Signed)
[name] states that her bp is either 207 or 211? Could not remember the bottom numbers .Marland Kitchen   Thanks

## 2013-09-30 ENCOUNTER — Ambulatory Visit: Payer: Medicare Other | Attending: Family Medicine | Admitting: Speech Pathology

## 2013-09-30 DIAGNOSIS — I69922 Dysarthria following unspecified cerebrovascular disease: Secondary | ICD-10-CM | POA: Insufficient documentation

## 2013-09-30 DIAGNOSIS — IMO0001 Reserved for inherently not codable concepts without codable children: Secondary | ICD-10-CM | POA: Insufficient documentation

## 2013-10-01 ENCOUNTER — Encounter: Payer: Self-pay | Admitting: Pharmacist Clinician (PhC)/ Clinical Pharmacy Specialist

## 2013-10-01 ENCOUNTER — Ambulatory Visit: Payer: Medicare Other

## 2013-10-01 ENCOUNTER — Ambulatory Visit (INDEPENDENT_AMBULATORY_CARE_PROVIDER_SITE_OTHER): Payer: Medicare Other | Admitting: Pharmacist Clinician (PhC)/ Clinical Pharmacy Specialist

## 2013-10-01 VITALS — BP 190/90 | HR 60 | Ht 64.0 in | Wt 146.2 lb

## 2013-10-01 DIAGNOSIS — I10 Essential (primary) hypertension: Secondary | ICD-10-CM

## 2013-10-01 NOTE — Progress Notes (Signed)
10/01/2013 [name] Jan 25, 1924 [id number]   HPI:  [name] is a 80 y.o. female patient of Dr Ellyn Hack, with a PMH below who presents today for a blood pressure check.  She was admitted to [facility] on 1/30 for hypertension and CVA.  She was discharged the following day and then returned to the ER on 2/2 but was not admitted at that time.  She saw Dr. Ellyn Hack on 2/3 and her BP was still elevated at 210/96.  He discussed with her the importance of taking her meds and had her increase carvedilol from 3.125 to 6.25mg  bid.  He also added amlodipine 2.5mg  daily.  She also had a previous stroke about 12 years ago.  She checked her BP the following day at [facility] and was unchanged.  She was asked to continue with the two medications and return today for follow up.  Today she states she is compliant with all medications.  She says that prior to last week she was not on any medications as she had discontinued everything.  She felt that all was fine and she "had it made".  She has never used tobacco products and quit using alcohol about 40 years ago.  She doesn't do any regular exercise, although she does run errands and household chores.  She adds salt to her food regularly and eats out fast food or cafeteria several times per week.  She does state that she gave up caffeine in the past year, previously drinking 1-2 liters/day.     Current Outpatient Prescriptions  Medication Sig Dispense Refill  . acetaminophen (TYLENOL) 500 MG tablet Take 500 mg by mouth as needed for pain.      Marland Kitchen amLODipine (NORVASC) 2.5 MG tablet Take 1 tablet (2.5 mg total) by mouth daily.  30 tablet  11  . aspirin 81 MG EC tablet Take 1 tablet (81 mg total) by mouth daily. Swallow whole.  30 tablet  12  . atorvastatin (LIPITOR) 10 MG tablet Take 10 mg by mouth every other day.      . carvedilol (COREG) 3.125 MG tablet Take 2 tablets twice a day.      . clopidogrel (PLAVIX) 75 MG tablet Take 1 tablet (75 mg total) by mouth  daily with breakfast.  30 tablet  0  . Propylene Glycol-Glycerin (SOOTHE OP) Place 1 drop into both eyes daily.        No current facility-administered medications for this visit.    Allergies  Allergen Reactions  . Codeine Rash    Past Medical History  Diagnosis Date  . Peripheral vascular disease 2010    Known distal left SFA occlusion, known right posterior tibial artery occlusion as well as left posterior and anterior tibial artery occlusion.; Most recent Dopplers November 2012: Patent right SFA stent, left ABI 0.56  . Coronary artery disease - history of MI October 2006    Severe RCA lesion -- 2.5 Mm x 12 Mm Vision BMS; January 2007 -. Distal stent stenosis in RCA treated with 2.5 mm x y 12 mm Vision BMS stent distally;  circumflex/OM stenosis -- 2.5 mg 12 mm Mini Vision BMS ;  most recent cath October 11: 100% RCA occlusion with ISR. Left right collaterals. Patent to the extent, the 70% AV groove circumflex. Apical LAD stenosis 70-80 %, diffuse disease; EF 65-70%   . Aortic valve stenosis, senile calcific January 2011    Echo: Normal EF 60-65%, mild aortic stenosis - estimated valve area 1.1  cm -- recently followed up with new echocardiogram  . Chronic kidney disease 1964  . Hypertension     Previously on multiple medications now on only clonidine  . Stroke     Carotid artery Doppler show right internal carotid 60-80% stenosis. Previously followed by Dr. Donzetta Kohut  . Non-ST elevation MI (NSTEMI) 2006, 2011  . DVT (deep venous thrombosis)   . Aneurysm   . Anemia   . Cancer 1963    ovarian cancer  . Arthritis   . Bilateral renal artery stenosis 2006 Deon Pilling)    - Right renal artery 60% stenosis March '13  . Dyslipidemia, goal LDL below 70      no longer on statin  . Cerebral aneurysm without rupture     3 mm; followed by Dr. Arnoldo Morale as well as vascular surgery    Blood pressure 190/90, pulse 60, height 5\' 4"  (1.626 m), weight 146 lb 3.2 oz (66.316 kg).   ASSESSMENT AND PLAN:   Although down 20 points from her visit with Dr. Ellyn Hack, her BP is still very high today.  I will increase her amlodipine to 5mg  daily and she is to return in 1 week for follow up.  She admits to having a BP cuff at home, but doesn't believe it's accuracy, although I don't quite understand why.  I have asked her to check her BP at home daily and bring that cuff, along with the readings when she comes back next week.  Since she still has plenty of tablets I have asked her to continue with 2 tablets twice daily on the carvediolol, take 2 tablets each morning of the amlodipine and bring both bottles to her next appt.  At that time I will switch her to the higher strength tablets, to reinforce the idea that she will drop to 1 tablet of each and avoid potential mix-ups in dosing.    Tommy Medal PharmD CPP [facility]

## 2013-10-01 NOTE — Patient Instructions (Addendum)
Return in 1 week for a blood pressure check  Your blood pressure today is high at 190/90  Check your blood pressure at home daily (if able) and keep record of the readings.  Take your BP meds as follows:  AM: carvedilol 6.25mg   (2 x 3.125mg  tabs until gone then fill prescription for 6.25mg  tablet)         Amlodipine 5mg    (2 x 2.5mg  tablets)   PM: carvedilol 6.25mg    Bring all of your meds, your BP cuff and your record of home blood pressures to your next appointment.  Exercise as you're able, try to walk approximately 30 minutes per day.  Keep salt intake to a minimum, especially watch canned and prepared boxed foods.  Eat more fresh fruits and vegetables and fewer canned items.  Avoid eating in fast food restaurants.    HOW TO TAKE YOUR BLOOD PRESSURE:   Rest 5 minutes before taking your blood pressure.    Don't smoke or drink caffeinated beverages for at least 30 minutes before.   Take your blood pressure before (not after) you eat.   Sit comfortably with your back supported and both feet on the floor (don't cross your legs).   Elevate your arm to heart level on a table or a desk.   Use the proper sized cuff. It should fit smoothly and snugly around your bare upper arm. There should be enough room to slip a fingertip under the cuff. The bottom edge of the cuff should be 1 inch above the crease of the elbow.   Ideally, take 3 measurements at one sitting and record the average.

## 2013-10-04 ENCOUNTER — Encounter: Payer: Self-pay | Admitting: Pharmacist Clinician (PhC)/ Clinical Pharmacy Specialist

## 2013-10-04 NOTE — Progress Notes (Signed)
I agree with this plan -- BP does look better, but still too high.  Leonie Man, MD

## 2013-10-06 ENCOUNTER — Ambulatory Visit: Payer: Medicare Other

## 2013-10-07 ENCOUNTER — Ambulatory Visit (INDEPENDENT_AMBULATORY_CARE_PROVIDER_SITE_OTHER): Payer: Medicare Other | Admitting: Pharmacist Clinician (PhC)/ Clinical Pharmacy Specialist

## 2013-10-07 ENCOUNTER — Encounter: Payer: Medicare Other | Admitting: Pharmacist Clinician (PhC)/ Clinical Pharmacy Specialist

## 2013-10-07 ENCOUNTER — Encounter: Payer: Self-pay | Admitting: Pharmacist Clinician (PhC)/ Clinical Pharmacy Specialist

## 2013-10-07 VITALS — BP 170/90 | HR 68 | Wt 149.0 lb

## 2013-10-07 DIAGNOSIS — I10 Essential (primary) hypertension: Secondary | ICD-10-CM

## 2013-10-07 MED ORDER — CHLORTHALIDONE 25 MG PO TABS: ORAL_TABLET | ORAL | Status: DC

## 2013-10-07 MED ORDER — CARVEDILOL 6.25 MG PO TABS: 6.2500 mg | ORAL_TABLET | Freq: Two times a day (BID) | ORAL | Status: DC

## 2013-10-07 MED ORDER — AMLODIPINE BESYLATE 5 MG PO TABS: 5.0000 mg | ORAL_TABLET | Freq: Every day | ORAL | Status: DC

## 2013-10-07 NOTE — Progress Notes (Signed)
10/07/2013 [name] August 06, 1924 [id number]   HPI:  [name] is a 80 y.o. female patient of Dr Ellyn Hack, with a PMH below who presents today for a blood pressure check.  I saw her last week for an initial BP check 3 days after she saw Dr. Ellyn Hack.  At his visit her pressure was 210/96 and he increased her carvedilol to 6.25mg  bid and added amlodipine 2.5mg  qd.  This was 1 week after a hospital discharge for stroke.  When I saw her 3 days later her BP was down to 190/90.  At that time I asked her to increase amlodipine to 5mg  daily.  She returns 1 week later with her home BP cuff.  She confuses somewhat easily and did not increase the amlodipine as was asked.  She did however start to cut back on her salt intake over the past week.  Today she has her daughter with her, who is hoping to help make sure the medications are taken correctly.       Current Outpatient Prescriptions  Medication Sig Dispense Refill  . acetaminophen (TYLENOL) 500 MG tablet Take 500 mg by mouth as needed for pain.      Marland Kitchen amLODipine (NORVASC) 5 MG tablet Take 1 tablet (5 mg total) by mouth daily.  30 tablet  3  . aspirin 81 MG EC tablet Take 1 tablet (81 mg total) by mouth daily. Swallow whole.  30 tablet  12  . atorvastatin (LIPITOR) 10 MG tablet Take 10 mg by mouth every other day.      . carvedilol (COREG) 6.25 MG tablet Take 1 tablet (6.25 mg total) by mouth 2 (two) times daily.  60 tablet  3  . chlorthalidone (HYGROTON) 25 MG tablet Take 1/2 tablet by mouth daily  15 tablet  3  . clopidogrel (PLAVIX) 75 MG tablet Take 1 tablet (75 mg total) by mouth daily with breakfast.  30 tablet  0  . Propylene Glycol-Glycerin (SOOTHE OP) Place 1 drop into both eyes daily.        No current facility-administered medications for this visit.    Allergies  Allergen Reactions  . Codeine Rash    Past Medical History  Diagnosis Date  . Peripheral vascular disease 2010    Known distal left SFA occlusion, known  right posterior tibial artery occlusion as well as left posterior and anterior tibial artery occlusion.; Most recent Dopplers November 2012: Patent right SFA stent, left ABI 0.56  . Coronary artery disease - history of MI October 2006    Severe RCA lesion -- 2.5 Mm x 12 Mm Vision BMS; January 2007 -. Distal stent stenosis in RCA treated with 2.5 mm x y 12 mm Vision BMS stent distally;  circumflex/OM stenosis -- 2.5 mg 12 mm Mini Vision BMS ;  most recent cath October 11: 100% RCA occlusion with ISR. Left right collaterals. Patent to the extent, the 70% AV groove circumflex. Apical LAD stenosis 70-80 %, diffuse disease; EF 65-70%   . Aortic valve stenosis, senile calcific January 2011    Echo: Normal EF 60-65%, mild aortic stenosis - estimated valve area 1.1 cm -- recently followed up with new echocardiogram  . Chronic kidney disease 1964  . Hypertension     Previously on multiple medications now on only clonidine  . Stroke     Carotid artery Doppler show right internal carotid 60-80% stenosis. Previously followed by Dr. Donzetta Kohut  . Non-ST elevation MI (NSTEMI) 2006, 2011  .  DVT (deep venous thrombosis)   . Aneurysm   . Anemia   . Cancer 1963    ovarian cancer  . Arthritis   . Bilateral renal artery stenosis 2006 Deon Pilling)    - Right renal artery 60% stenosis March '13  . Dyslipidemia, goal LDL below 70      no longer on statin  . Cerebral aneurysm without rupture     3 mm; followed by Dr. Arnoldo Morale as well as vascular surgery    Blood pressure 170/90, pulse 68, weight 149 lb (67.586 kg).   ASSESSMENT AND PLAN:  Still some small improvements in her BP.  I showed the patient and her daughter how to apply the BP cuff (they were doing it incorrectly) and read the results.  I gave them a log sheet and asked that they try to get her pressure taken once daily.  I am again going to increase her amlodipine to 5mg , once daily in the evenings, and add chlorthalidone 12.5mg  each morning.  I asked the  patient to throw away the remaining tablets of amlodipine 2.5mg  and carvedilol 3.125mg  and just pick up the 3 prescriptions and start new.  I wrote explicit instructions about stopping old doses and starting new ones as well as explaining it several times.  I will see her back in 3 weeks for follow up.  At that time I will have her repeat BMET.     Tommy Medal PharmD CPP [facility]

## 2013-10-07 NOTE — Patient Instructions (Signed)
Stop carvedilol 3.125mg  - 2 tablets twice daily and start on the carvedilol 6.25mg , 1 tablet twice daily  Stop amlodipine 2.5mg  tabl - start with the new amlodipine 5mg  tablet each evening  Start chlorthalidone - take 1/2 of 25mg  tablet each morning  Continue to take BP at home each morning and write it on the log sheet  Return for BP check in 3 weeks   HOW TO TAKE YOUR BLOOD PRESSURE:   Rest 5 minutes before taking your blood pressure.    Don't smoke or drink caffeinated beverages for at least 30 minutes before.   Take your blood pressure before (not after) you eat.   Sit comfortably with your back supported and both feet on the floor (don't cross your legs).   Elevate your arm to heart level on a table or a desk.   Use the proper sized cuff. It should fit smoothly and snugly around your bare upper arm. There should be enough room to slip a fingertip under the cuff. The bottom edge of the cuff should be 1 inch above the crease of the elbow.   Ideally, take 3 measurements at one sitting and record the average.

## 2013-10-08 ENCOUNTER — Ambulatory Visit: Payer: Medicare Other

## 2013-10-08 NOTE — Progress Notes (Signed)
Challenging patient.    I appreciate your working with her.  Leonie Man, MD

## 2013-10-15 ENCOUNTER — Ambulatory Visit: Payer: Medicare Other

## 2013-10-22 ENCOUNTER — Encounter: Payer: Medicare Other | Admitting: Speech Pathology

## 2013-10-22 ENCOUNTER — Other Ambulatory Visit: Payer: Self-pay | Admitting: Pharmacist Clinician (PhC)/ Clinical Pharmacy Specialist

## 2013-10-22 NOTE — Telephone Encounter (Signed)
Rx was sent to pharmacy electronically. 

## 2013-10-27 ENCOUNTER — Encounter: Payer: Medicare Other | Admitting: Speech Pathology

## 2013-10-29 ENCOUNTER — Ambulatory Visit: Payer: Medicare Other | Admitting: Pharmacist Clinician (PhC)/ Clinical Pharmacy Specialist

## 2013-11-10 ENCOUNTER — Encounter (HOSPITAL_COMMUNITY): Payer: Medicare Other

## 2013-11-18 ENCOUNTER — Ambulatory Visit (HOSPITAL_COMMUNITY)
Admission: RE | Admit: 2013-11-18 | Discharge: 2013-11-18 | Disposition: A | Discharge status: Home / Self Care | Payer: Medicare Other | Source: Ambulatory Visit | Attending: Cardiology | Admitting: Cardiology

## 2013-11-18 DIAGNOSIS — I10 Essential (primary) hypertension: Secondary | ICD-10-CM | POA: Insufficient documentation

## 2013-11-18 DIAGNOSIS — I739 Peripheral vascular disease, unspecified: Secondary | ICD-10-CM | POA: Insufficient documentation

## 2013-11-18 DIAGNOSIS — I701 Atherosclerosis of renal artery: Secondary | ICD-10-CM

## 2013-11-18 NOTE — Progress Notes (Signed)
Renal Duplex Completed. Sherrine Salberg, BS, RDMS, RVT  

## 2013-12-01 ENCOUNTER — Telehealth: Payer: Self-pay | Admitting: *Deleted

## 2013-12-01 NOTE — Telephone Encounter (Signed)
Message copied by Raiford Simmonds on Wed Dec 01, 2013 11:17 AM ------      Message from: Ellyn Hack, DAVID W      Created: Mon Nov 29, 2013  8:08 AM       No notable change from last dopplers.  < 60% narrowing in Left side stent; R stent widely patent.            F/u as recommended.            Leonie Man, MD       ------

## 2013-12-01 NOTE — Telephone Encounter (Signed)
Spoke to patient. Result given . Verbalized understanding  

## 2014-01-27 ENCOUNTER — Other Ambulatory Visit: Payer: Self-pay | Admitting: Pharmacist Clinician (PhC)/ Clinical Pharmacy Specialist

## 2014-01-27 NOTE — Telephone Encounter (Signed)
Rx was sent to pharmacy electronically. 

## 2014-01-31 ENCOUNTER — Other Ambulatory Visit: Payer: Self-pay | Admitting: *Deleted

## 2014-01-31 MED ORDER — CARVEDILOL 6.25 MG PO TABS: 6.2500 mg | ORAL_TABLET | Freq: Two times a day (BID) | ORAL | Status: DC

## 2014-01-31 NOTE — Telephone Encounter (Signed)
Rx was sent to pharmacy electronically. 

## 2014-02-09 ENCOUNTER — Other Ambulatory Visit: Payer: Self-pay | Admitting: *Deleted

## 2014-02-09 MED ORDER — CHLORTHALIDONE 25 MG PO TABS: ORAL_TABLET | ORAL | Status: DC

## 2014-02-09 NOTE — Telephone Encounter (Signed)
Chlorthalidone 25mg  refilled electronically to [facility].

## 2014-03-23 ENCOUNTER — Other Ambulatory Visit (HOSPITAL_COMMUNITY): Payer: Medicare Other

## 2014-03-23 ENCOUNTER — Ambulatory Visit: Payer: Medicare Other | Admitting: Family

## 2014-03-25 ENCOUNTER — Encounter: Payer: Self-pay | Admitting: Family

## 2014-03-28 ENCOUNTER — Encounter: Payer: Self-pay | Admitting: Family

## 2014-03-28 ENCOUNTER — Ambulatory Visit (INDEPENDENT_AMBULATORY_CARE_PROVIDER_SITE_OTHER): Payer: Medicare Other | Admitting: Family

## 2014-03-28 ENCOUNTER — Ambulatory Visit (HOSPITAL_COMMUNITY)
Admission: RE | Admit: 2014-03-28 | Discharge: 2014-03-28 | Disposition: A | Discharge status: Home / Self Care | Payer: Medicare Other | Source: Ambulatory Visit | Attending: Family | Admitting: Family

## 2014-03-28 VITALS — BP 150/84 | HR 62 | Resp 16 | Ht 63.0 in | Wt 150.0 lb

## 2014-03-28 DIAGNOSIS — I6529 Occlusion and stenosis of unspecified carotid artery: Secondary | ICD-10-CM | POA: Insufficient documentation

## 2014-03-28 DIAGNOSIS — M79609 Pain in unspecified limb: Secondary | ICD-10-CM | POA: Insufficient documentation

## 2014-03-28 DIAGNOSIS — R209 Unspecified disturbances of skin sensation: Secondary | ICD-10-CM

## 2014-03-28 DIAGNOSIS — Z48812 Encounter for surgical aftercare following surgery on the circulatory system: Secondary | ICD-10-CM | POA: Diagnosis not present

## 2014-03-28 DIAGNOSIS — R202 Paresthesia of skin: Secondary | ICD-10-CM

## 2014-03-28 DIAGNOSIS — R252 Cramp and spasm: Secondary | ICD-10-CM

## 2014-03-28 NOTE — Progress Notes (Signed)
Established Carotid Patient   History of Present Illness  [name] is a 80 y.o. female patient of Dr. Kellie Simmering seen for known carotid stenosis right greater than left.  She returns today for follow up.   She also has a history of PAD followed by Dr. Ellyn Hack and is s/p FEMORAL ARTERY STENT Laterality: Right Date: 2006, 2011, 2012 Atherectomy with PTA alone in 2006; April 2000: Right SFA - overlapping stents (6.0 mm x 170 mm x2 and 6.0 mm x 40 mm); Cutting Balloon PTA of right SFA ISR -- patent by Dopplers and cath in 2012 RENAL ARTERY STENT Laterality: Bilateral Date: 2006 Dr. Deon Pilling; patent as of 2012 cath ILIAC ARTERY STENT Laterality: Right Date: 2006 POPLITEAL ARTERY ANGIOPLASTY Date: July 04, 2011 Done in the same setting as PTA of ISR in right SFA.  Pt denies LE claudication symptoms, denies non-healing wounds.   Patient has not had previous carotid artery intervention.  She states that she has a stroke in February, 2015 as manifested by right racial drooping and slurred speech that mostly resolved in a month, denies hemiparesis, denies monocular loss of vision. She had a stroke about 2004 as manifested by left eye amaurosis fugax which last about 9 weeks, then resolved.   CT of head on 09/27/13: IMPRESSION:  Evolving small right posterior frontal infarct (corresponding to  recent MRI abnormality), without acute intracranial process.  Left occipital encephalomalacia may reflect remote posterior  cerebral artery territory infarct or trauma. Moderate white matter  changes suggest chronic small vessel ischemic disease.    Pt denies New Medical or Surgical History.  Pt denies headaches today but states she has headaches sometimes.   Pt Diabetic: No  Pt smoker: non-smoker  Pt meds include:  Statin : Yes  Betablocker: Yes  ASA: Yes, but takes 2-3x/week on the advise of her PCP.  Other anticoagulants/antiplatelets: Plavix    Past Medical History  Diagnosis Date  .  Peripheral vascular disease 2010    Known distal left SFA occlusion, known right posterior tibial artery occlusion as well as left posterior and anterior tibial artery occlusion.; Most recent Dopplers November 2012: Patent right SFA stent, left ABI 0.56  . Coronary artery disease - history of MI October 2006    Severe RCA lesion -- 2.5 Mm x 12 Mm Vision BMS; January 2007 -. Distal stent stenosis in RCA treated with 2.5 mm x y 12 mm Vision BMS stent distally;  circumflex/OM stenosis -- 2.5 mg 12 mm Mini Vision BMS ;  most recent cath October 11: 100% RCA occlusion with ISR. Left right collaterals. Patent to the extent, the 70% AV groove circumflex. Apical LAD stenosis 70-80 %, diffuse disease; EF 65-70%   . Aortic valve stenosis, senile calcific January 2011    Echo: Normal EF 60-65%, mild aortic stenosis - estimated valve area 1.1 cm -- recently followed up with new echocardiogram  . Chronic kidney disease 1964  . Hypertension     Previously on multiple medications now on only clonidine  . Non-ST elevation MI (NSTEMI) 2006, 2011  . DVT (deep venous thrombosis)   . Aneurysm   . Anemia   . Cancer 1963    ovarian cancer  . Arthritis   . Bilateral renal artery stenosis 2006 Deon Pilling)    - Right renal artery 60% stenosis March '13  . Dyslipidemia, goal LDL below 70      no longer on statin  . Cerebral aneurysm without rupture     3  mm; followed by Dr. Arnoldo Morale as well as vascular surgery  . Stroke Feb. 2015    Carotid artery Doppler show right internal carotid 60-80% stenosis. Previously followed by Dr. Donzetta Kohut  . Carotid artery occlusion     Social History History  Substance Use Topics  . Smoking status: Never Smoker   . Smokeless tobacco: Never Used  . Alcohol Use: No     Comment: social drinker  years ago-no longer    Family History Family History  Problem Relation Age of Onset  . Irritable bowel syndrome Daughter   . Colon cancer Neg Hx   . Cancer Mother     Ovarian  . Heart  attack Father     Surgical History Past Surgical History  Procedure Laterality Date  . Abdominal hysterectomy    . Bladder surgery      bladder tack  . Breast lumpectomy      left  . Appendectomy    . Femoral artery stent Right 2006, 2011, 2012    Atherectomy with PTA alone in 2006; April 2000: Right SFA - overlapping stents (6.0 mm x 170 mm x2 and 6.0 mm x 40 mm); Cutting Balloon PTA of right SFA ISR -- patent by Dopplers and cath in 2012  . Renal artery stent Bilateral 2006    Dr. Deon Pilling; patent as of 2012 cath  . Iliac artery stent Right 2006  . Popliteal artery angioplasty  July 04, 2011    Done in the same setting as PTA of ISR in right SFA  . Doppler echocardiography  09/25/2009    ef 60 to 65%,aotric valve mild stenosis 1.1cm^2(VTI)  . Nm myocar perf wall motion  10/13/2009    EF 73%  . Coronary angioplasty with stent placement  05/27/2005    RCA - MiniVision BMS 2.5 mm x 12 mm  . Coronary angioplasty with stent placement Bilateral 01/23/20072007    Circumflex-OM -- 2.5 mm x 12 mm Mini Vision BMS; RCA distal to previous stent 2.5 mm x 12 mm Mini Vision BMS  . Cardiac catheterization  05/28/2010    EF 65-70%,multi vessel CAD  . Carotid doppler  02/04/2012; January 2015    Done at [facility]----right internal 60 to 79%,bilateral external patent,bilateral common carotid artery tortuosity present  . Renal doppler  11/13/2011    bil.renal arterial stents patent w/less than 60%,right and left kidneys equal in size  . Lower arterial extrem. doppler  07/23/2011    ABI RIGHT .92 ,LEFT ABI .51 ,RGT PROX.SFA STENT 0-49%,RGT SFA  W/O EVIDENCE OF SIGNIFICANT DIAMETER REDUCTION,RGT POPLITEAL ARTERY 50% NARROWING   . Transthoracic echocardiogram  September 25 2013    Mild concentric LVH ;; EF 60-65%. Increased left atrial pressure. Mild-moderate aortic stenosis with trivial regurgitation. Severe MAC with mild stenosis. Mild LA dilation. PA pressures mildly elevated at 42 mmHg.    Allergies   Allergen Reactions  . Codeine Rash    Current Outpatient Prescriptions  Medication Sig Dispense Refill  . acetaminophen (TYLENOL) 500 MG tablet Take 500 mg by mouth as needed for pain.      Marland Kitchen atorvastatin (LIPITOR) 10 MG tablet Take 10 mg by mouth every other day.      . carvedilol (COREG) 6.25 MG tablet Take 1 tablet (6.25 mg total) by mouth 2 (two) times daily.  60 tablet  8  . chlorthalidone (HYGROTON) 25 MG tablet Take 1/2 tablet by mouth daily  15 tablet  5  . clopidogrel (PLAVIX) 75 MG tablet take 1  tablet by mouth once daily WITH BREAKFAST  30 tablet  11  . Propylene Glycol-Glycerin (SOOTHE OP) Place 1 drop into both eyes daily.       Marland Kitchen amLODipine (NORVASC) 5 MG tablet take 1 tablet by mouth once daily  30 tablet  7  . aspirin 81 MG EC tablet Take 1 tablet (81 mg total) by mouth daily. Swallow whole.  30 tablet  12   No current facility-administered medications for this visit.    Review of Systems : See HPI for pertinent positives and negatives.  Physical Examination  Filed Vitals:   03/28/14 1237 03/28/14 1238  BP: 135/89 150/84  Pulse: 67 62  Resp:  16  Height:  5\' 3"  (1.6 m)  Weight:  150 lb (68.04 kg)  SpO2:  95%   Body mass index is 26.58 kg/(m^2).  General: WDWN female in NAD  GAIT: normal  Eyes: Pupils equal  Pulmonary: CTAB, Negative Rales, Negative rhonchi, & Negative wheezing.  Cardiac: regular Rhythm , Positive Murmur.   VASCULAR EXAM  Carotid Bruits  Left  Right    Positive Positive   Aorta is not palpable.  Radial pulses are 1+ palpable and equal.   LE Pulses  LEFT  RIGHT   POPLITEAL  not palpable  not palpable   POSTERIOR TIBIAL  not palpable  not palpable   DORSALIS PEDIS  ANTERIOR TIBIAL  not palpable  palpable    Gastrointestinal: soft, nontender, BS WNL, no r/g, negative masses.  Musculoskeletal: Negative muscle atrophy/wasting. M/S 5/5 in UE's, 4/5 in LE's and symetric, Extremities without ischemic changes.  Neurologic: A&O X 3;  Appropriate Affect ; SENSATION ;normal;  Speech is normal  CN 2-12 intact, Pain and light touch intact in extremities, Motor exam as listed above.  Non-Invasive Vascular Imaging CAROTID DUPLEX 03/28/2014   CEREBROVASCULAR DUPLEX EVALUATION    INDICATION: Follow-up carotid disease; status post stroke    PREVIOUS INTERVENTION(S):     DUPLEX EXAM:     RIGHT  LEFT  Peak Systolic Velocities (cm/s) End Diastolic Velocities (cm/s) Plaque LOCATION Peak Systolic Velocities (cm/s) End Diastolic Velocities (cm/s) Plaque  82 14  CCA PROXIMAL 105 27   79 18  CCA MID 74 18   56 11  CCA DISTAL 83 25   56 9  ECA 82 9   232 76 CP ICA PROXIMAL 91 28 HT  118 31 HT ICA MID 99 32   47 16  ICA DISTAL 138 48     4.1 ICA / CCA Ratio (PSV) 1.1  Antegrade  Vertebral Flow Antegrade   024 Brachial Systolic Pressure (mmHg) 097  Biphasic  Brachial Artery Waveforms Biphasic    Plaque Morphology:  HM = Homogeneous, HT = Heterogeneous, CP = Calcific Plaque, SP = Smooth Plaque, IP = Irregular Plaque     ADDITIONAL FINDINGS: Known aortic stenosis may cause underestimation of disease in the carotid arteries.    IMPRESSION: 1. Velocities suggest 60%-79% stenosis of the right internal carotid artery. Plaque is calcific with acoustic shadow which may underestimate disease. 2. Evidence of <40% stenosis of the left internal carotid artery. 3. Vessel tortuosity is noted in the right proximal common carotid artery and the mid internal carotid artery. 4. Bilateral vertebral artery is antegrade but dampened.    Compared to the previous exam:  No significant change compared to prior exam.     Assessment: [name] is a 80 y.o. female who presents with known carotid stenosis right greater than  left. She states that she has a stroke in February, 2015 as manifested by right racial drooping and slurred speech that mostly resolved in a month, denies hemiparesis, denies monocular loss of vision. She had a stroke  about 2004 as manifested by left eye amaurosis fugax which last about 9 weeks, then resolved.   Carotid Duplex today: Velocities suggest 60%-79% stenosis of the right internal carotid artery. Plaque is calcific with acoustic shadow which may underestimate disease. Evidence of <40% stenosis of the left internal carotid artery. Vessel tortuosity is noted in the right proximal common carotid artery and the mid internal carotid artery. Bilateral vertebral artery is antegrade but dampened. No significant change compared to prior exam.  CT of head on 09/27/13: IMPRESSION:  Evolving small right posterior frontal infarct (corresponding to  recent MRI abnormality), without acute intracranial process.  Left occipital encephalomalacia may reflect remote posterior  cerebral artery territory infarct or trauma. Moderate white matter  changes suggest chronic small vessel ischemic disease.   Discussed with Dr. Kellie Simmering who is not convinced that her symptoms are secondary to to her right ICA stenosis. In the background of her age and symptomatic aortic stenosis, ICA intervention is not indicated at this point, will again evaluate in 6 months. See Plan.  Plan: Follow-up in 6 months with Carotid Duplex scan.   I discussed in depth with the patient the nature of atherosclerosis, and emphasized the importance of maximal medical management including strict control of blood pressure, blood glucose, and lipid levels, obtaining regular exercise, and continued cessation of smoking.  The patient is aware that without maximal medical management the underlying atherosclerotic disease process will progress, limiting the benefit of any interventions. The patient was given information about stroke prevention and what symptoms should prompt the patient to seek immediate medical care. Thank you for allowing [place] to participate in this patient's care.  Clemon Chambers, RN, MSN, FNP-C [facility] Office: [facility] Physician: Trula Slade  03/28/2014 12:48 PM

## 2014-03-28 NOTE — Patient Instructions (Signed)
Stroke Prevention Some medical conditions and behaviors are associated with an increased chance of having a stroke. You may prevent a stroke by making healthy choices and managing medical conditions. HOW CAN I REDUCE MY RISK OF HAVING A STROKE?   Stay physically active. Get at least 30 minutes of activity on most or all days.  Do not smoke. It may also be helpful to avoid exposure to secondhand smoke.  Limit alcohol use. Moderate alcohol use is considered to be:  No more than 2 drinks per day for men.  No more than 1 drink per day for nonpregnant women.  Eat healthy foods. This involves:  Eating 5 or more servings of fruits and vegetables a day.  Making dietary changes that address high blood pressure (hypertension), high cholesterol, diabetes, or obesity.  Manage your cholesterol levels.  Making food choices that are high in fiber and low in saturated fat, trans fat, and cholesterol may control cholesterol levels.  Take any prescribed medicines to control cholesterol as directed by your health care provider.  Manage your diabetes.  Controlling your carbohydrate and sugar intake is recommended to manage diabetes.  Take any prescribed medicines to control diabetes as directed by your health care provider.  Control your hypertension.  Making food choices that are low in salt (sodium), saturated fat, trans fat, and cholesterol is recommended to manage hypertension.  Take any prescribed medicines to control hypertension as directed by your health care provider.  Maintain a healthy weight.  Reducing calorie intake and making food choices that are low in sodium, saturated fat, trans fat, and cholesterol are recommended to manage weight.  Stop drug abuse.  Avoid taking birth control pills.  Talk to your health care provider about the risks of taking birth control pills if you are over 35 years old, smoke, get migraines, or have ever had a blood clot.  Get evaluated for sleep  disorders (sleep apnea).  Talk to your health care provider about getting a sleep evaluation if you snore a lot or have excessive sleepiness.  Take medicines only as directed by your health care provider.  For some people, aspirin or blood thinners (anticoagulants) are helpful in reducing the risk of forming abnormal blood clots that can lead to stroke. If you have the irregular heart rhythm of atrial fibrillation, you should be on a blood thinner unless there is a good reason you cannot take them.  Understand all your medicine instructions.  Make sure that other conditions (such as anemia or atherosclerosis) are addressed. SEEK IMMEDIATE MEDICAL CARE IF:   You have sudden weakness or numbness of the face, arm, or leg, especially on one side of the body.  Your face or eyelid droops to one side.  You have sudden confusion.  You have trouble speaking (aphasia) or understanding.  You have sudden trouble seeing in one or both eyes.  You have sudden trouble walking.  You have dizziness.  You have a loss of balance or coordination.  You have a sudden, severe headache with no known cause.  You have new chest pain or an irregular heartbeat. Any of these symptoms may represent a serious problem that is an emergency. Do not wait to see if the symptoms will go away. Get medical help at once. Call your local emergency services (911 in [place] Do not drive yourself to the hospital. Document Released: 09/19/2004 Document Revised: 12/27/2013 Document Reviewed: 02/12/2013 [employer] Patient Information 2015 [employer]. This information is not intended to replace advice given   to you by your health care provider. Make sure you discuss any questions you have with your health care provider.  

## 2014-05-21 ENCOUNTER — Other Ambulatory Visit: Payer: Self-pay | Admitting: Family Medicine

## 2014-05-24 ENCOUNTER — Telehealth (HOSPITAL_COMMUNITY): Payer: Self-pay | Admitting: *Deleted

## 2014-05-30 ENCOUNTER — Telehealth (HOSPITAL_COMMUNITY): Payer: Self-pay | Admitting: *Deleted

## 2014-06-07 ENCOUNTER — Telehealth (HOSPITAL_COMMUNITY): Payer: Self-pay | Admitting: *Deleted

## 2014-06-13 ENCOUNTER — Telehealth: Payer: Self-pay | Admitting: Cardiology

## 2014-06-13 NOTE — Telephone Encounter (Signed)
Returned call to patient # [phone] has been disconnected.Spoke to patient's grand daughter she gave me patient's cell # [phone].Cell # called no answer.[place].

## 2014-06-13 NOTE — Telephone Encounter (Signed)
Pt called in stating that she would like to see Dr. Ellyn Hack on 11/2; the same day that she is having a test done because she says that she is having some chest pains. Dr. Ellyn Hack has a DOD slot at 2:15pm , can she be put in that slot?  Thanks

## 2014-06-14 NOTE — Telephone Encounter (Signed)
Left message at cell # and home number To call back

## 2014-06-21 ENCOUNTER — Ambulatory Visit: Payer: Medicare Other | Admitting: Vascular Surgery

## 2014-06-21 ENCOUNTER — Other Ambulatory Visit: Payer: Medicare Other

## 2014-06-27 ENCOUNTER — Ambulatory Visit (HOSPITAL_COMMUNITY)
Admission: RE | Admit: 2014-06-27 | Discharge: 2014-06-27 | Disposition: A | Discharge status: Home / Self Care | Payer: Medicare Other | Source: Ambulatory Visit | Attending: Cardiology | Admitting: Cardiology

## 2014-06-27 ENCOUNTER — Other Ambulatory Visit: Payer: Self-pay | Admitting: *Deleted

## 2014-06-27 DIAGNOSIS — I739 Peripheral vascular disease, unspecified: Secondary | ICD-10-CM

## 2014-06-27 NOTE — Progress Notes (Signed)
Lower Extremity Arterial Duplex Completed. °Brianna L Mazza,RVT °

## 2014-07-05 ENCOUNTER — Ambulatory Visit (INDEPENDENT_AMBULATORY_CARE_PROVIDER_SITE_OTHER): Payer: Medicare Other | Admitting: Physician Assistant

## 2014-07-05 ENCOUNTER — Encounter: Payer: Self-pay | Admitting: Physician Assistant

## 2014-07-05 VITALS — BP 132/68 | HR 74 | Ht 65.0 in | Wt 153.3 lb

## 2014-07-05 DIAGNOSIS — I350 Nonrheumatic aortic (valve) stenosis: Secondary | ICD-10-CM

## 2014-07-05 DIAGNOSIS — E785 Hyperlipidemia, unspecified: Secondary | ICD-10-CM

## 2014-07-05 DIAGNOSIS — I10 Essential (primary) hypertension: Secondary | ICD-10-CM

## 2014-07-05 DIAGNOSIS — R079 Chest pain, unspecified: Secondary | ICD-10-CM

## 2014-07-05 DIAGNOSIS — I739 Peripheral vascular disease, unspecified: Secondary | ICD-10-CM

## 2014-07-05 NOTE — Progress Notes (Signed)
Date:  07/05/2014   ID:  [name], DOB 20-Mar-1924, MRN [id number]  PCP:  No primary care provider on file.  Primary Cardiologist:  [name]     History of Present Illness: [name] is a 80 y.o. female with an extensive past medical history of coronary artery and peripheral vascular as well as carotid artery disease as outlined below.  She was admitted on January 30 when she presented what amounted be the following day after an episode difficulties and slurred speech. Is associated with generalized tingling, but she went to bed the symptoms,and did not go to the emergency room until they were still present upon awakening. She was profoundly hypertensive upon initial evaluation with blood pressures in the 260/160 range. Head CT was negative. MRI demonstrated right posterior frontal gyrus infarction. Echocardiogram was performed as noted below. No change. Interestingly, she does have carotid Dopplers done by [name] from vascular surgery they were relatively stable.  Since last seen her she has seen both [name] for peripheral vascular disease and [name] for carotid artery disease.   She presents today for 6 month evaluation.  She had LEA dopplers on Jun 27, 2014 which revealed left ABI of 0.61.  This is unchanged from prior however, the PT is now occluded.  She reports the tops of her feet burn occasionally but it is not associated with walking.   No claudication.  The patient currently denies nausea, vomiting, fever, chest pain, shortness of breath, orthopnea, dizziness, PND, cough, congestion, abdominal pain, hematochezia, melena, lower extremity edema.  Echo Study Conclusions  - Left ventricle: The cavity size was normal. There was mild concentric hypertrophy. Systolic function was normal. The estimated ejection fraction was in the range of 60% to 65%. Wall motion was normal; there were no regional wall motion abnormalities. The study is not  technically sufficient to allow evaluation of LV diastolic function. Doppler parameters are consistent with elevated mean left atrial filling pressure. - Aortic valve: There was mild to moderate stenosis. Trivial regurgitation. Valve area: 0.71cm^2(VTI). Valve area: 1.1cm^2 (Vmax). - Mitral valve: Severely calcified annulus. Thickening. The findings are consistent with mild stenosis. Mild regurgitation. Valve area by pressure half-time: 2.1cm^2. - Left atrium: The atrium was mildly dilated. - Atrial septum: No defect or patent foramen ovale was identified. - Pulmonary arteries: Systolic pressure was mildly increased. PA peak pressure: 74mm Hg (S).  Wt Readings from Last 3 Encounters:  07/05/14 153 lb 4.8 oz (69.536 kg)  03/28/14 150 lb (68.04 kg)  10/07/13 149 lb (67.586 kg)     Past Medical History  Diagnosis Date  . Peripheral vascular disease 2010    Known distal left SFA occlusion, known right posterior tibial artery occlusion as well as left posterior and anterior tibial artery occlusion.; Most recent Dopplers November 2012: Patent right SFA stent, left ABI 0.56  . Coronary artery disease - history of MI October 2006    Severe RCA lesion -- 2.5 Mm x 12 Mm Vision BMS; January 2007 -. Distal stent stenosis in RCA treated with 2.5 mm x y 12 mm Vision BMS stent distally;  circumflex/OM stenosis -- 2.5 mg 12 mm Mini Vision BMS ;  most recent cath October 11: 100% RCA occlusion with ISR. Left right collaterals. Patent to the extent, the 70% AV groove circumflex. Apical LAD stenosis 70-80 %, diffuse disease; EF 65-70%   . Aortic valve stenosis, senile calcific January 2011    Echo: Normal EF 60-65%, mild aortic  stenosis - estimated valve area 1.1 cm -- recently followed up with new echocardiogram  . Chronic kidney disease 1964  . Hypertension     Previously on multiple medications now on only clonidine  . Non-ST elevation MI (NSTEMI) 2006, 2011  . DVT (deep venous  thrombosis)   . Aneurysm   . Anemia   . Cancer 1963    ovarian cancer  . Arthritis   . Bilateral renal artery stenosis 2006 [name])    - Right renal artery 60% stenosis March '13  . Dyslipidemia, goal LDL below 70      no longer on statin  . Cerebral aneurysm without rupture     3 mm; followed by Dr. Arnoldo Morale as well as vascular surgery  . Stroke Feb. 2015    Carotid artery Doppler show right internal carotid 60-80% stenosis. Previously followed by Dr. Donzetta Kohut  . Carotid artery occlusion     Current Outpatient Prescriptions  Medication Sig Dispense Refill  . acetaminophen (TYLENOL) 500 MG tablet Take 500 mg by mouth as needed for pain.    Marland Kitchen amLODipine (NORVASC) 5 MG tablet take 1 tablet by mouth once daily 30 tablet 7  . aspirin 81 MG EC tablet Take 1 tablet (81 mg total) by mouth daily. Swallow whole. 30 tablet 12  . atorvastatin (LIPITOR) 10 MG tablet Take 10 mg by mouth every other day.    Marland Kitchen atorvastatin (LIPITOR) 10 MG tablet take 1 tablet by mouth once daily 90 tablet 0  . carvedilol (COREG) 6.25 MG tablet Take 1 tablet (6.25 mg total) by mouth 2 (two) times daily. 60 tablet 8  . chlorthalidone (HYGROTON) 25 MG tablet Take 1/2 tablet by mouth daily 15 tablet 5  . clopidogrel (PLAVIX) 75 MG tablet take 1 tablet by mouth once daily WITH BREAKFAST 30 tablet 11  . Propylene Glycol-Glycerin (SOOTHE OP) Place 1 drop into both eyes daily.      No current facility-administered medications for this visit.    Allergies:    Allergies  Allergen Reactions  . Codeine Rash    Social History:  The patient  reports that she has never smoked. She has never used smokeless tobacco. She reports that she does not drink alcohol or use illicit drugs.   Family history:   Family History  Problem Relation Age of Onset  . Irritable bowel syndrome Daughter   . Colon cancer Neg Hx   . Cancer Mother     Ovarian  . Heart attack Father     ROS:  Please see the history of present illness.  All  other systems reviewed and negative.   PHYSICAL EXAM: VS:  BP 132/68 mmHg  Pulse 74  Ht 5\' 5"  (1.651 m)  Wt 153 lb 4.8 oz (69.536 kg)  BMI 25.51 kg/m2 Well nourished, well developed, in no acute distress HEENT: Pupils are equal round react to light accommodation extraocular movements are intact.  Neck: no JVDNo cervical lymphadenopathy. Cardiac: Regular rate and rhythm with 2/6 sys MM. Lungs:  clear to auscultation bilaterally, no wheezing, rhonchi or rales Abd: soft, nontender, positive bowel sounds all quadrants, no hepatosplenomegaly Ext: no lower extremity edema.  2+ radial and dorsalis pedis pulses. Skin: warm and dry Neuro:  Grossly normal  EKG:  NSR 74,  PACs  ASSESSMENT AND PLAN:  Problem List Items Addressed This Visit    Aortic valve stenosis, senile calcific (Chronic)    Will repeat echo in Feb 2016.  This will be the one year  mark.     HLD (hyperlipidemia) (Chronic)    Last lipid panel  Lipid Panel     Component Value Date/Time   CHOL 239* 09/25/2013 0400   TRIG 140 09/25/2013 0400   HDL 44 09/25/2013 0400   CHOLHDL 5.4 09/25/2013 0400   VLDL 28 09/25/2013 0400   LDLCALC 167* 09/25/2013 0400   On a statin.  Recheck at next office visit    HTN (hypertension), malignant    BP just mildly elevated.  No changes in meds today.    Peripheral vascular disease (Chronic)    No complaints of claudication, dizziness.  Recent ABI on the left is 0.61 and was the same on the previous LEA dopplers.  Distal SFA occlusion and DP/PT occlusion.  Asymptomatic.       Other Visit Diagnoses    Chest pain, unspecified chest pain type    -  Primary    Relevant Orders       EKG 12-Lead       2D Echocardiogram without contrast    Aortic stenosis        Relevant Orders       2D Echocardiogram without contrast

## 2014-07-05 NOTE — Assessment & Plan Note (Addendum)
Last lipid panel  Lipid Panel     Component Value Date/Time   CHOL 239* 09/25/2013 0400   TRIG 140 09/25/2013 0400   HDL 44 09/25/2013 0400   CHOLHDL 5.4 09/25/2013 0400   VLDL 28 09/25/2013 0400   LDLCALC 167* 09/25/2013 0400   On a statin.  Recheck at next office visit

## 2014-07-05 NOTE — Patient Instructions (Signed)
Your physician has requested that you have an echocardiogram in February 2016. Echocardiography is a painless test that uses sound waves to create images of your heart. It provides your doctor with information about the size and shape of your heart and how well your heart's chambers and valves are working. This procedure takes approximately one hour. There are no restrictions for this procedure.   Your physician recommends that you schedule a follow-up appointment in: 6 months with Dr. Ellyn Hack.

## 2014-07-05 NOTE — Assessment & Plan Note (Signed)
BP just mildly elevated.  No changes in meds today.

## 2014-07-05 NOTE — Assessment & Plan Note (Signed)
Will repeat echo in Feb 2016.  This will be the one year mark.

## 2014-07-05 NOTE — Assessment & Plan Note (Signed)
No angina 

## 2014-07-05 NOTE — Assessment & Plan Note (Addendum)
No complaints of claudication, dizziness.  Recent ABI on the left is 0.61 and was the same on the previous LEA dopplers.  Distal SFA occlusion and DP/PT occlusion.  Asymptomatic.

## 2014-07-05 NOTE — Assessment & Plan Note (Signed)
Followed by Dr. Lawson. 

## 2014-07-11 ENCOUNTER — Other Ambulatory Visit: Payer: Self-pay | Admitting: Family Medicine

## 2014-07-13 ENCOUNTER — Ambulatory Visit (HOSPITAL_COMMUNITY): Payer: Medicare Other

## 2014-07-19 ENCOUNTER — Telehealth: Payer: Self-pay | Admitting: Cardiology

## 2014-07-19 NOTE — Telephone Encounter (Signed)
Pt called in stating that she had a test done on 11/2 and had not received any results. Please call  Thanks

## 2014-07-19 NOTE — Telephone Encounter (Signed)
Pt. Informed about her dopplers

## 2014-07-27 ENCOUNTER — Other Ambulatory Visit: Payer: Self-pay | Admitting: Pharmacist Clinician (PhC)/ Clinical Pharmacy Specialist

## 2014-07-28 NOTE — Telephone Encounter (Signed)
Rx was sent to pharmacy electronically. 

## 2014-08-03 ENCOUNTER — Encounter (HOSPITAL_COMMUNITY): Payer: Self-pay | Admitting: Cardiovascular Disease

## 2014-08-09 ENCOUNTER — Telehealth: Payer: Self-pay | Admitting: Family Medicine

## 2014-08-09 NOTE — Telephone Encounter (Signed)
Pt was called to make an appt and I was informed by pt that she has another pcp and no longer a pt here.

## 2014-09-22 ENCOUNTER — Other Ambulatory Visit: Payer: Self-pay | Admitting: Family Medicine

## 2014-09-22 ENCOUNTER — Ambulatory Visit (INDEPENDENT_AMBULATORY_CARE_PROVIDER_SITE_OTHER): Payer: Medicare Other | Admitting: Family Medicine

## 2014-09-22 ENCOUNTER — Encounter: Payer: Self-pay | Admitting: Family Medicine

## 2014-09-22 VITALS — BP 130/80 | HR 70 | Temp 98.0°F | Wt 150.0 lb

## 2014-09-22 DIAGNOSIS — N183 Chronic kidney disease, stage 3 unspecified: Secondary | ICD-10-CM

## 2014-09-22 DIAGNOSIS — I10 Essential (primary) hypertension: Secondary | ICD-10-CM | POA: Diagnosis not present

## 2014-09-22 DIAGNOSIS — I739 Peripheral vascular disease, unspecified: Secondary | ICD-10-CM | POA: Diagnosis not present

## 2014-09-22 DIAGNOSIS — S0990XA Unspecified injury of head, initial encounter: Secondary | ICD-10-CM | POA: Diagnosis not present

## 2014-09-22 DIAGNOSIS — E785 Hyperlipidemia, unspecified: Secondary | ICD-10-CM

## 2014-09-22 DIAGNOSIS — J019 Acute sinusitis, unspecified: Secondary | ICD-10-CM

## 2014-09-22 DIAGNOSIS — R7301 Impaired fasting glucose: Secondary | ICD-10-CM | POA: Diagnosis not present

## 2014-09-22 LAB — COMPREHENSIVE METABOLIC PANEL
ALK PHOS: 83 U/L (ref 39–117)
AST: 14 U/L (ref 0–37)
Albumin: 4.3 g/dL (ref 3.5–5.2)
BILIRUBIN TOTAL: 0.7 mg/dL (ref 0.2–1.2)
BUN: 26 mg/dL — ABNORMAL HIGH (ref 6–23)
CO2: 30 meq/L (ref 19–32)
CREATININE: 1.71 mg/dL — AB (ref 0.50–1.10)
Calcium: 10.5 mg/dL (ref 8.4–10.5)
Chloride: 102 mEq/L (ref 96–112)
GLUCOSE: 127 mg/dL — AB (ref 70–99)
Potassium: 3.3 mEq/L — ABNORMAL LOW (ref 3.5–5.3)
SODIUM: 140 meq/L (ref 135–145)
Total Protein: 7.1 g/dL (ref 6.0–8.3)

## 2014-09-22 LAB — CBC WITH DIFFERENTIAL/PLATELET
BASOS ABS: 0 10*3/uL (ref 0.0–0.1)
Basophils Relative: 0 % (ref 0–1)
EOS ABS: 0.4 10*3/uL (ref 0.0–0.7)
Eosinophils Relative: 6 % — ABNORMAL HIGH (ref 0–5)
HCT: 37.8 % (ref 36.0–46.0)
HEMOGLOBIN: 12.5 g/dL (ref 12.0–15.0)
LYMPHS PCT: 29 % (ref 12–46)
Lymphs Abs: 1.9 10*3/uL (ref 0.7–4.0)
MCH: 32.2 pg (ref 26.0–34.0)
MCHC: 33.1 g/dL (ref 30.0–36.0)
MCV: 97.4 fL (ref 78.0–100.0)
MONOS PCT: 10 % (ref 3–12)
MPV: 11.6 fL (ref 8.6–12.4)
Monocytes Absolute: 0.6 10*3/uL (ref 0.1–1.0)
NEUTROS ABS: 3.5 10*3/uL (ref 1.7–7.7)
NEUTROS PCT: 55 % (ref 43–77)
Platelets: 190 10*3/uL (ref 150–400)
RBC: 3.88 MIL/uL (ref 3.87–5.11)
RDW: 13.2 % (ref 11.5–15.5)
WBC: 6.4 10*3/uL (ref 4.0–10.5)

## 2014-09-22 LAB — LIPID PANEL
CHOL/HDL RATIO: 4.6 ratio
CHOLESTEROL: 192 mg/dL (ref 0–200)
HDL: 42 mg/dL (ref >39)
LDL Cholesterol: 117 mg/dL — ABNORMAL HIGH (ref 0–99)
Triglycerides: 167 mg/dL — ABNORMAL HIGH (ref <150)
VLDL: 33 mg/dL (ref 0–40)

## 2014-09-22 MED ORDER — AMOXICILLIN 875 MG PO TABS: 875.0000 mg | ORAL_TABLET | Freq: Two times a day (BID) | ORAL | Status: DC

## 2014-09-22 NOTE — Progress Notes (Signed)
   Subjective:    Patient ID: [name], female    DOB: 1924/06/18, 81 y.o.   MRN: [id number]  HPI She is here for evaluation of a head injury. She fell 5 days ago hitting occipital area. She did not lose consciousness; no history of dizziness, nausea, headache. She states that this occurred when she got up too quickly. Presently she only complains of slight scalp tenderness in the area that she landed on. She has a previous history of vascular disease requiring stenting and balloons etc. This is all well documented in the record. She also has a previous history of hyperlipidemia as well as CK D3. She does complain of a three-week history of nasal congestion, rhinorrhea, facial discomfort, PND but no fever, chills, sore throat or earache. Also of note is that she was apparently assigned to another physician has now switched back to me. This was done against her will by the insurance company. Review of Systems     Objective:   Physical Exam Alert and in no distress. Exam of the head shows only slight tenderness palpation in the occipital area but no palpable lesions noted. She did drive herself here today.       Assessment & Plan:  Head injury, initial encounter  Dyslipidemia, goal LDL below 70 - Plan: Lipid panel  CKD (chronic kidney disease) stage 3, GFR 30-59 ml/min - Plan: CBC with Differential/Platelet, Comprehensive metabolic panel  HLD (hyperlipidemia) - Plan: Lipid panel  Essential hypertension - Plan: CBC with Differential/Platelet, Comprehensive metabolic panel, Lipid panel  Peripheral vascular disease  Acute sinusitis, recurrence not specified, unspecified location  I will get routine blood studies back on her to get a fuller assessment of what going on. Discussed head injury with her in regard to increasing headache. She will call if she has further difficulties. Follow-up pending blood results.

## 2014-09-23 LAB — HEMOGLOBIN A1C
Hgb A1c MFr Bld: 6.2 % — ABNORMAL HIGH (ref <5.7)
Mean Plasma Glucose: 131 mg/dL — ABNORMAL HIGH (ref <117)

## 2014-09-24 ENCOUNTER — Other Ambulatory Visit: Payer: Self-pay | Admitting: Cardiology

## 2014-09-26 ENCOUNTER — Ambulatory Visit: Payer: Self-pay | Admitting: Family Medicine

## 2014-09-26 NOTE — Telephone Encounter (Signed)
Rx refill sent to patient pharmacy   

## 2014-09-27 ENCOUNTER — Ambulatory Visit: Payer: Medicare Other | Admitting: Family

## 2014-09-27 ENCOUNTER — Other Ambulatory Visit (HOSPITAL_COMMUNITY): Payer: Medicare Other

## 2014-10-06 ENCOUNTER — Encounter: Payer: Self-pay | Admitting: Family

## 2014-10-07 ENCOUNTER — Ambulatory Visit (INDEPENDENT_AMBULATORY_CARE_PROVIDER_SITE_OTHER): Payer: Medicare Other | Admitting: Family

## 2014-10-07 ENCOUNTER — Other Ambulatory Visit: Payer: Self-pay | Admitting: Family

## 2014-10-07 ENCOUNTER — Ambulatory Visit (HOSPITAL_COMMUNITY)
Admission: RE | Admit: 2014-10-07 | Discharge: 2014-10-07 | Disposition: A | Discharge status: Home / Self Care | Payer: Medicare Other | Source: Ambulatory Visit | Attending: Family | Admitting: Family

## 2014-10-07 ENCOUNTER — Encounter: Payer: Self-pay | Admitting: Family

## 2014-10-07 VITALS — BP 145/86 | HR 64 | Resp 16 | Ht 64.0 in | Wt 153.0 lb

## 2014-10-07 DIAGNOSIS — Z8673 Personal history of transient ischemic attack (TIA), and cerebral infarction without residual deficits: Secondary | ICD-10-CM | POA: Diagnosis not present

## 2014-10-07 DIAGNOSIS — I6523 Occlusion and stenosis of bilateral carotid arteries: Secondary | ICD-10-CM | POA: Insufficient documentation

## 2014-10-07 DIAGNOSIS — Z48812 Encounter for surgical aftercare following surgery on the circulatory system: Secondary | ICD-10-CM

## 2014-10-07 NOTE — Patient Instructions (Signed)
Stroke Prevention Some medical conditions and behaviors are associated with an increased chance of having a stroke. You may prevent a stroke by making healthy choices and managing medical conditions. HOW CAN I REDUCE MY RISK OF HAVING A STROKE?   Stay physically active. Get at least 30 minutes of activity on most or all days.  Do not smoke. It may also be helpful to avoid exposure to secondhand smoke.  Limit alcohol use. Moderate alcohol use is considered to be:  No more than 2 drinks per day for men.  No more than 1 drink per day for nonpregnant women.  Eat healthy foods. This involves:  Eating 5 or more servings of fruits and vegetables a day.  Making dietary changes that address high blood pressure (hypertension), high cholesterol, diabetes, or obesity.  Manage your cholesterol levels.  Making food choices that are high in fiber and low in saturated fat, trans fat, and cholesterol may control cholesterol levels.  Take any prescribed medicines to control cholesterol as directed by your health care provider.  Manage your diabetes.  Controlling your carbohydrate and sugar intake is recommended to manage diabetes.  Take any prescribed medicines to control diabetes as directed by your health care provider.  Control your hypertension.  Making food choices that are low in salt (sodium), saturated fat, trans fat, and cholesterol is recommended to manage hypertension.  Take any prescribed medicines to control hypertension as directed by your health care provider.  Maintain a healthy weight.  Reducing calorie intake and making food choices that are low in sodium, saturated fat, trans fat, and cholesterol are recommended to manage weight.  Stop drug abuse.  Avoid taking birth control pills.  Talk to your health care provider about the risks of taking birth control pills if you are over 35 years old, smoke, get migraines, or have ever had a blood clot.  Get evaluated for sleep  disorders (sleep apnea).  Talk to your health care provider about getting a sleep evaluation if you snore a lot or have excessive sleepiness.  Take medicines only as directed by your health care provider.  For some people, aspirin or blood thinners (anticoagulants) are helpful in reducing the risk of forming abnormal blood clots that can lead to stroke. If you have the irregular heart rhythm of atrial fibrillation, you should be on a blood thinner unless there is a good reason you cannot take them.  Understand all your medicine instructions.  Make sure that other conditions (such as anemia or atherosclerosis) are addressed. SEEK IMMEDIATE MEDICAL CARE IF:   You have sudden weakness or numbness of the face, arm, or leg, especially on one side of the body.  Your face or eyelid droops to one side.  You have sudden confusion.  You have trouble speaking (aphasia) or understanding.  You have sudden trouble seeing in one or both eyes.  You have sudden trouble walking.  You have dizziness.  You have a loss of balance or coordination.  You have a sudden, severe headache with no known cause.  You have new chest pain or an irregular heartbeat. Any of these symptoms may represent a serious problem that is an emergency. Do not wait to see if the symptoms will go away. Get medical help at once. Call your local emergency services (911 in [place] Do not drive yourself to the hospital. Document Released: 09/19/2004 Document Revised: 12/27/2013 Document Reviewed: 02/12/2013 [employer] Patient Information 2015 [employer]. This information is not intended to replace advice given   to you by your health care provider. Make sure you discuss any questions you have with your health care provider.  

## 2014-10-07 NOTE — Progress Notes (Signed)
Established Carotid Patient   History of Present Illness  [name] is a 81 y.o. female patient of Dr. Kellie Simmering seen for known carotid artery stenosis, right greater than left.  She returns today for follow up.   She also has a history of PAD followed by Dr. Ellyn Hack and is s/p right femoral artery stent in 2006, 2011, and 2012. Atherectomy with PTA alone in 2006; April 2000: Right SFA - overlapping stents (6.0 mm x 170 mm x2 and 6.0 mm x 40 mm); Cutting Balloon PTA of right SFA ISR -- patent by Dopplers and cath in 2012. Bilatera renal artery stents in 2006 Dr. Deon Pilling; patent as of 2012 cath.  Right iliac artery stent in 2006.  Popliteal angioplasty July 04, 2011 done in the same setting as PTA of ISR in right SFA.   Pt denies LE claudication symptoms, denies non-healing wounds.   Patient has not had previous carotid artery intervention.  She states that she has a stroke in February, 2015 as manifested by right racial drooping and slurred speech that mostly resolved in a month, denies hemiparesis, denies monocular loss of vision, has had no further stroke or TIA's. She had a stroke about 2004 as manifested by left eye amaurosis fugax which lasted about 9 weeks, then resolved.   CT of head on 09/27/13: IMPRESSION:  Evolving small right posterior frontal infarct (corresponding to  recent MRI abnormality), without acute intracranial process.  Left occipital encephalomalacia may reflect remote posterior  cerebral artery territory infarct or trauma. Moderate white matter  changes suggest chronic small vessel ischemic disease.    Pt denies New Medical or Surgical History.  She has had no headaches since 2013 or 2014. Pt states she has an appointment to see Dr. Ellyn Hack on 10/11/14.  Pt Diabetic: No  Pt smoker: non-smoker  Pt meds include:  Statin : Yes  Betablocker: Yes  ASA: Yes, but takes 2-3x/week on the advise of her PCP.  Other anticoagulants/antiplatelets: Plavix    Past Medical History  Diagnosis Date  . Peripheral vascular disease 2010    Known distal left SFA occlusion, known right posterior tibial artery occlusion as well as left posterior and anterior tibial artery occlusion.; Most recent Dopplers November 2012: Patent right SFA stent, left ABI 0.56  . Coronary artery disease - history of MI October 2006    Severe RCA lesion -- 2.5 Mm x 12 Mm Vision BMS; January 2007 -. Distal stent stenosis in RCA treated with 2.5 mm x y 12 mm Vision BMS stent distally;  circumflex/OM stenosis -- 2.5 mg 12 mm Mini Vision BMS ;  most recent cath October 11: 100% RCA occlusion with ISR. Left right collaterals. Patent to the extent, the 70% AV groove circumflex. Apical LAD stenosis 70-80 %, diffuse disease; EF 65-70%   . Aortic valve stenosis, senile calcific January 2011    Echo: Normal EF 60-65%, mild aortic stenosis - estimated valve area 1.1 cm -- recently followed up with new echocardiogram  . Chronic kidney disease 1964  . Hypertension     Previously on multiple medications now on only clonidine  . Non-ST elevation MI (NSTEMI) 2006, 2011  . DVT (deep venous thrombosis)   . Aneurysm   . Anemia   . Cancer 1963    ovarian cancer  . Arthritis   . Bilateral renal artery stenosis 2006 Deon Pilling)    - Right renal artery 60% stenosis March '13  . Dyslipidemia, goal LDL below 70  no longer on statin  . Cerebral aneurysm without rupture     3 mm; followed by Dr. Arnoldo Morale as well as vascular surgery  . Stroke Feb. 2015    Carotid artery Doppler show right internal carotid 60-80% stenosis. Previously followed by Dr. Donzetta Kohut  . Carotid artery occlusion   . Fall at home Jan. 22, 2016    Social History History  Substance Use Topics  . Smoking status: Never Smoker   . Smokeless tobacco: Never Used  . Alcohol Use: No     Comment: social drinker  years ago-no longer    Family History Family History  Problem Relation Age of Onset  . Irritable bowel syndrome  Daughter   . Colon cancer Neg Hx   . Cancer Mother     Ovarian  . Heart attack Father     Surgical History Past Surgical History  Procedure Laterality Date  . Abdominal hysterectomy    . Bladder surgery      bladder tack  . Breast lumpectomy      left  . Appendectomy    . Femoral artery stent Right 2006, 2011, 2012    Atherectomy with PTA alone in 2006; April 2000: Right SFA - overlapping stents (6.0 mm x 170 mm x2 and 6.0 mm x 40 mm); Cutting Balloon PTA of right SFA ISR -- patent by Dopplers and cath in 2012  . Renal artery stent Bilateral 2006    Dr. Deon Pilling; patent as of 2012 cath  . Iliac artery stent Right 2006  . Popliteal artery angioplasty  July 04, 2011    Done in the same setting as PTA of ISR in right SFA  . Doppler echocardiography  09/25/2009    ef 60 to 65%,aotric valve mild stenosis 1.1cm^2(VTI)  . Nm myocar perf wall motion  10/13/2009    EF 73%  . Coronary angioplasty with stent placement  05/27/2005    RCA - MiniVision BMS 2.5 mm x 12 mm  . Coronary angioplasty with stent placement Bilateral 01/23/20072007    Circumflex-OM -- 2.5 mm x 12 mm Mini Vision BMS; RCA distal to previous stent 2.5 mm x 12 mm Mini Vision BMS  . Cardiac catheterization  05/28/2010    EF 65-70%,multi vessel CAD  . Carotid doppler  02/04/2012; January 2015    Done at [facility]----right internal 60 to 79%,bilateral external patent,bilateral common carotid artery tortuosity present  . Renal doppler  11/13/2011    bil.renal arterial stents patent w/less than 60%,right and left kidneys equal in size  . Lower arterial extrem. doppler  07/23/2011    ABI RIGHT .92 ,LEFT ABI .14 ,RGT PROX.SFA STENT 0-49%,RGT SFA  W/O EVIDENCE OF SIGNIFICANT DIAMETER REDUCTION,RGT POPLITEAL ARTERY 50% NARROWING   . Transthoracic echocardiogram  September 25 2013    Mild concentric LVH ;; EF 60-65%. Increased left atrial pressure. Mild-moderate aortic stenosis with trivial regurgitation. Severe MAC with mild stenosis.  Mild LA dilation. PA pressures mildly elevated at 42 mmHg.  Marland Kitchen Lower extremity angiogram Left 07/04/2011    Procedure: LOWER EXTREMITY ANGIOGRAM;  Surgeon: Lorretta Harp, MD;  Location: [facility] CATH LAB;  Service: Cardiovascular;  Laterality: Left;    Allergies  Allergen Reactions  . Codeine Rash    Current Outpatient Prescriptions  Medication Sig Dispense Refill  . acetaminophen (TYLENOL) 500 MG tablet Take 500 mg by mouth as needed for pain.    Marland Kitchen aspirin 81 MG EC tablet Take 1 tablet (81 mg total) by mouth daily. Swallow whole.  30 tablet 12  . atorvastatin (LIPITOR) 10 MG tablet Take 10 mg by mouth every other day.    . carvedilol (COREG) 6.25 MG tablet Take 1 tablet (6.25 mg total) by mouth 2 (two) times daily. 60 tablet 8  . chlorthalidone (HYGROTON) 25 MG tablet take 1/2 tablet by mouth once daily 15 tablet 11  . clopidogrel (PLAVIX) 75 MG tablet take 1 tablet by mouth once daily WITH BREAKFAST 30 tablet 11  . Propylene Glycol-Glycerin (SOOTHE OP) Place 1 drop into both eyes daily.     Marland Kitchen amLODipine (NORVASC) 5 MG tablet Take 1 tablet (5 mg total) by mouth daily. (Patient not taking: Reported on 10/07/2014) 30 tablet 10  . amoxicillin (AMOXIL) 875 MG tablet Take 1 tablet (875 mg total) by mouth 2 (two) times daily. (Patient not taking: Reported on 10/07/2014) 20 tablet 0   No current facility-administered medications for this visit.    Review of Systems : See HPI for pertinent positives and negatives.  Physical Examination  Filed Vitals:   10/07/14 1345 10/07/14 1346  BP: 134/81 145/86  Pulse: 65 64  Resp:  16  Height:  5\' 4"  (1.626 m)  Weight:  153 lb (69.4 kg)  SpO2:  95%   Body mass index is 26.25 kg/(m^2).    General: WDWN female in NAD  GAIT: normal  Eyes: Pupils equal  Pulmonary: CTAB, Negative Rales, Negative rhonchi, & Negative wheezing.  Cardiac: regular Rhythm , Positive Murmur.   VASCULAR EXAM  Carotid Bruits  Left  Right    Positive Positive    Aorta is not palpable.  Radial pulses are 1+ palpable and equal.   LE Pulses  LEFT  RIGHT   FEMORAL 2+ palpable 1+ palpable  POPLITEAL  not palpable  not palpable   POSTERIOR TIBIAL  not palpable  not palpable   DORSALIS PEDIS  ANTERIOR TIBIAL  not palpable  Not palpable    Gastrointestinal: soft, nontender, BS WNL, no r/g, no palpable masses.  Musculoskeletal: Negative muscle atrophy/wasting. M/S 5/5 in UE's, 4/5 in LE's and symetric, Extremities without ischemic changes.  Neurologic: A&O X 3; Appropriate Affect ; SENSATION ;normal;  Speech is normal  CN 2-12 intact, Pain and light touch intact in extremities, Motor exam as listed above.         Non-Invasive Vascular Imaging CAROTID DUPLEX 10/07/2014   CEREBROVASCULAR DUPLEX EVALUATION    INDICATION: Carotid stenosis; History of CVA    PREVIOUS INTERVENTION(S): NA    DUPLEX EXAM:     RIGHT  LEFT  Peak Systolic Velocities (cm/s) End Diastolic Velocities (cm/s) Plaque LOCATION Peak Systolic Velocities (cm/s) End Diastolic Velocities (cm/s) Plaque  84 12  CCA PROXIMAL 96 22   71 14  CCA MID 110 28   51 10 HT CCA DISTAL 55 16 HT  54 4  ECA 55 10 HT  236 84 CP ICA PROXIMAL 92 28   119 22 HT ICA MID 101 33   50 16  ICA DISTAL 105 32     3.32 ICA / CCA Ratio (PSV) 0.84  Antegrade Vertebral Flow Antegrade  341 Brachial Systolic Pressure (mmHg) 962  Biphasic Brachial Artery Waveforms Biphasic    Plaque Morphology:  HM = Homogeneous, HT = Heterogeneous, CP = Calcific Plaque, SP = Smooth Plaque, IP = Irregular Plaque     ADDITIONAL FINDINGS: Known aortic disease may cause underestimation of disease in the carotid arteries. Vessel tortuousity present throughout the carotid artery systems.    IMPRESSION:  Right internal carotid artery stenosis present in the 60%-79% range, this may be underestimated due to calcific plaque present making Doppler interrogation difficult. Left internal carotid artery  stenosis present of less than 40%.    Compared to the previous exam:  Essentially unchanged since previous study on 03/28/2014.      Assessment: [name] is a 81 y.o. female who presents with asymptomatic 60%-79% right ICA stenosis,  this may be underestimated due to calcific plaque present making Doppler interrogation difficult. Left internal carotid artery stenosis present of less than 40%. Essentially unchanged since previous study on 03/28/2014.  Dr. Ellyn Hack has been monitoring patient's PAD, patient has an appointment to see Dr. Ellyn Hack on 10/11/14; she has no signs of ischemia in her LE's.  Plan: Follow-up in 6 months with Carotid Duplex.   I discussed in depth with the patient the nature of atherosclerosis, and emphasized the importance of maximal medical management including strict control of blood pressure, blood glucose, and lipid levels, obtaining regular exercise, and continued cessation of smoking.  The patient is aware that without maximal medical management the underlying atherosclerotic disease process will progress, limiting the benefit of any interventions. The patient was given information about stroke prevention and what symptoms should prompt the patient to seek immediate medical care. Thank you for allowing [place] to participate in this patient's care.  Clemon Chambers, RN, MSN, FNP-C [facility] Office: [phone]  Clinic Physician: Bridgett Larsson  10/07/2014 1:53 PM

## 2014-10-11 ENCOUNTER — Inpatient Hospital Stay (HOSPITAL_COMMUNITY): Admission: RE | Admit: 2014-10-11 | Payer: Medicare Other | Source: Ambulatory Visit

## 2014-10-14 ENCOUNTER — Ambulatory Visit (HOSPITAL_COMMUNITY)
Admission: RE | Admit: 2014-10-14 | Discharge: 2014-10-14 | Disposition: A | Discharge status: Home / Self Care | Payer: Medicare Other | Source: Ambulatory Visit | Attending: Cardiology | Admitting: Cardiology

## 2014-10-14 DIAGNOSIS — E785 Hyperlipidemia, unspecified: Secondary | ICD-10-CM | POA: Diagnosis not present

## 2014-10-14 DIAGNOSIS — R079 Chest pain, unspecified: Secondary | ICD-10-CM

## 2014-10-14 DIAGNOSIS — Z8249 Family history of ischemic heart disease and other diseases of the circulatory system: Secondary | ICD-10-CM | POA: Diagnosis not present

## 2014-10-14 DIAGNOSIS — I10 Essential (primary) hypertension: Secondary | ICD-10-CM | POA: Diagnosis not present

## 2014-10-14 DIAGNOSIS — I350 Nonrheumatic aortic (valve) stenosis: Secondary | ICD-10-CM | POA: Diagnosis not present

## 2014-10-14 NOTE — Progress Notes (Signed)
2D Echocardiogram Complete.  10/14/2014   [name] [place], [place]

## 2014-10-18 ENCOUNTER — Encounter: Payer: Self-pay | Admitting: Family Medicine

## 2014-10-18 ENCOUNTER — Ambulatory Visit (INDEPENDENT_AMBULATORY_CARE_PROVIDER_SITE_OTHER): Payer: Medicare Other | Admitting: Family Medicine

## 2014-10-18 VITALS — BP 140/80 | HR 70 | Temp 97.8°F | Wt 152.0 lb

## 2014-10-18 DIAGNOSIS — J0100 Acute maxillary sinusitis, unspecified: Secondary | ICD-10-CM | POA: Diagnosis not present

## 2014-10-18 MED ORDER — AMOXICILLIN-POT CLAVULANATE 875-125 MG PO TABS: 1.0000 | ORAL_TABLET | Freq: Two times a day (BID) | ORAL | Status: DC

## 2014-10-18 NOTE — Progress Notes (Signed)
   Subjective:    Patient ID: [name], female    DOB: 04-04-1924, 81 y.o.   MRN: [id number]  HPI She complains of a 2 month history of intermittent left earache with associated nasal congestion, rhinorrhea, PND. No fever, chills or upper tooth discomfort. She has a remote history of allergies. She does not smoke. She also has a question about a possible aneurysm. Review of the record indicates a small cerebral aneurysm.   Review of Systems     Objective:   Physical Exam Alert and in no distress. Tympanic membranes and canals are normal. Pharyngeal area is normal. Neck is supple without adenopathy or thyromegaly. Cardiac exam shows a regular sinus rhythm without murmurs or gallops. Lungs are clear to auscultation. Nasal mucosa is normal. She does have tenderness over left maxillary sinus.        Assessment & Plan:  Acute maxillary sinusitis, recurrence not specified - Plan: amoxicillin-clavulanate (AUGMENTIN) 875-125 MG per tablet  she is to call if not entirely back to normal. The MRI was reviewed with a radiologist. She is really not a candidate for any major intervention with her underlying atherosclerotic disease.

## 2014-10-18 NOTE — Patient Instructions (Signed)
Take all the antibiotic and if you're not totally back to normal when you finish give me a call

## 2014-10-24 ENCOUNTER — Other Ambulatory Visit (HOSPITAL_COMMUNITY): Payer: Self-pay | Admitting: Cardiology

## 2014-10-24 DIAGNOSIS — I739 Peripheral vascular disease, unspecified: Secondary | ICD-10-CM

## 2014-10-25 ENCOUNTER — Telehealth: Payer: Self-pay | Admitting: Cardiology

## 2014-10-25 NOTE — Telephone Encounter (Signed)
Mrs. [name] is calling about the results of her test she had . Please call    Thanks

## 2014-10-25 NOTE — Telephone Encounter (Signed)
Returned call to patient she stated she was calling to get results of 10/14/14 echo.Advised results not available.Message sent to [place].

## 2014-10-27 NOTE — Telephone Encounter (Signed)
No answer, will try again.

## 2014-10-27 NOTE — Telephone Encounter (Signed)
-----   Message from Leonie Man, MD sent at 10/25/2014  6:06 PM EST ----- Regarding: Echo result Bryan ordered an echo - Minimal change in Aortic Stenosis severity.  Relatively stable.  Gaspar Bidding will probably review the result & respond as well.  [place]

## 2014-11-01 NOTE — Telephone Encounter (Signed)
Pt made aware echo results showing minimal change in aortic stenosis severity, and relatively stable per Tarri Fuller PA-C and Dr Ellyn Hack.  Pt verbalized understanding.

## 2014-11-01 NOTE — Telephone Encounter (Signed)
Pt is calling to receive the results from and Echo that was performed on 2/19. Please call back  Thanks

## 2014-11-03 ENCOUNTER — Encounter (HOSPITAL_COMMUNITY): Payer: Self-pay | Admitting: *Deleted

## 2014-11-09 ENCOUNTER — Other Ambulatory Visit: Payer: Self-pay | Admitting: Cardiology

## 2014-11-09 NOTE — Telephone Encounter (Signed)
Rx(s) sent to pharmacy electronically.  

## 2014-11-14 ENCOUNTER — Ambulatory Visit (HOSPITAL_COMMUNITY)
Admission: RE | Admit: 2014-11-14 | Discharge: 2014-11-14 | Disposition: A | Discharge status: Home / Self Care | Payer: Medicare Other | Source: Ambulatory Visit | Attending: Cardiology | Admitting: Cardiology

## 2014-11-14 DIAGNOSIS — I739 Peripheral vascular disease, unspecified: Secondary | ICD-10-CM | POA: Diagnosis present

## 2014-11-14 DIAGNOSIS — I701 Atherosclerosis of renal artery: Secondary | ICD-10-CM

## 2014-11-14 NOTE — Progress Notes (Signed)
Renal Duplex Completed. [place]ri Veals, BS, RDMS, RVT  

## 2014-11-21 ENCOUNTER — Telehealth: Payer: Self-pay | Admitting: *Deleted

## 2014-11-21 NOTE — Telephone Encounter (Signed)
Spoke to patient. Result given . Verbalized understanding  

## 2014-11-21 NOTE — Telephone Encounter (Signed)
-----   Message from Leonie Man, MD sent at 11/18/2014  2:47 PM EDT ----- Stable kidney artery stents on doppler.  [place]

## 2014-11-23 ENCOUNTER — Other Ambulatory Visit: Payer: Self-pay | Admitting: Pharmacist Clinician (PhC)/ Clinical Pharmacy Specialist

## 2014-11-23 NOTE — Telephone Encounter (Signed)
Rx(s) sent to pharmacy electronically.  

## 2014-12-09 ENCOUNTER — Other Ambulatory Visit: Payer: Self-pay | Admitting: Family Medicine

## 2015-02-18 ENCOUNTER — Encounter (HOSPITAL_COMMUNITY): Payer: Self-pay | Admitting: Emergency Medicine

## 2015-02-18 ENCOUNTER — Emergency Department (HOSPITAL_COMMUNITY)
Admission: EM | Admit: 2015-02-18 | Discharge: 2015-02-18 | Disposition: A | Discharge status: Home / Self Care | Payer: Medicare Other | Attending: Emergency Medicine | Admitting: Emergency Medicine

## 2015-02-18 ENCOUNTER — Emergency Department (HOSPITAL_COMMUNITY): Payer: Medicare Other

## 2015-02-18 DIAGNOSIS — Z79899 Other long term (current) drug therapy: Secondary | ICD-10-CM | POA: Diagnosis not present

## 2015-02-18 DIAGNOSIS — I2510 Atherosclerotic heart disease of native coronary artery without angina pectoris: Secondary | ICD-10-CM | POA: Insufficient documentation

## 2015-02-18 DIAGNOSIS — Z9861 Coronary angioplasty status: Secondary | ICD-10-CM | POA: Insufficient documentation

## 2015-02-18 DIAGNOSIS — Z7902 Long term (current) use of antithrombotics/antiplatelets: Secondary | ICD-10-CM | POA: Insufficient documentation

## 2015-02-18 DIAGNOSIS — M1990 Unspecified osteoarthritis, unspecified site: Secondary | ICD-10-CM | POA: Insufficient documentation

## 2015-02-18 DIAGNOSIS — N189 Chronic kidney disease, unspecified: Secondary | ICD-10-CM | POA: Insufficient documentation

## 2015-02-18 DIAGNOSIS — Z8543 Personal history of malignant neoplasm of ovary: Secondary | ICD-10-CM | POA: Diagnosis not present

## 2015-02-18 DIAGNOSIS — E876 Hypokalemia: Secondary | ICD-10-CM

## 2015-02-18 DIAGNOSIS — Z9889 Other specified postprocedural states: Secondary | ICD-10-CM | POA: Diagnosis not present

## 2015-02-18 DIAGNOSIS — Z86718 Personal history of other venous thrombosis and embolism: Secondary | ICD-10-CM | POA: Insufficient documentation

## 2015-02-18 DIAGNOSIS — R42 Dizziness and giddiness: Secondary | ICD-10-CM

## 2015-02-18 DIAGNOSIS — R110 Nausea: Secondary | ICD-10-CM | POA: Diagnosis not present

## 2015-02-18 DIAGNOSIS — E785 Hyperlipidemia, unspecified: Secondary | ICD-10-CM | POA: Diagnosis not present

## 2015-02-18 DIAGNOSIS — Z8673 Personal history of transient ischemic attack (TIA), and cerebral infarction without residual deficits: Secondary | ICD-10-CM | POA: Insufficient documentation

## 2015-02-18 DIAGNOSIS — R0789 Other chest pain: Secondary | ICD-10-CM | POA: Diagnosis present

## 2015-02-18 DIAGNOSIS — I129 Hypertensive chronic kidney disease with stage 1 through stage 4 chronic kidney disease, or unspecified chronic kidney disease: Secondary | ICD-10-CM | POA: Insufficient documentation

## 2015-02-18 DIAGNOSIS — Z7982 Long term (current) use of aspirin: Secondary | ICD-10-CM | POA: Diagnosis not present

## 2015-02-18 DIAGNOSIS — Z862 Personal history of diseases of the blood and blood-forming organs and certain disorders involving the immune mechanism: Secondary | ICD-10-CM | POA: Diagnosis not present

## 2015-02-18 DIAGNOSIS — Z792 Long term (current) use of antibiotics: Secondary | ICD-10-CM | POA: Insufficient documentation

## 2015-02-18 LAB — PROTIME-INR
INR: 0.98 (ref 0.00–1.49)
Prothrombin Time: 13.2 seconds (ref 11.6–15.2)

## 2015-02-18 LAB — COMPREHENSIVE METABOLIC PANEL
ALBUMIN: 4 g/dL (ref 3.5–5.0)
ALT: 12 U/L — ABNORMAL LOW (ref 14–54)
ANION GAP: 10 (ref 5–15)
AST: 18 U/L (ref 15–41)
Alkaline Phosphatase: 77 U/L (ref 38–126)
BUN: 26 mg/dL — ABNORMAL HIGH (ref 6–20)
CALCIUM: 10.3 mg/dL (ref 8.9–10.3)
CO2: 28 mmol/L (ref 22–32)
Chloride: 102 mmol/L (ref 101–111)
Creatinine, Ser: 1.64 mg/dL — ABNORMAL HIGH (ref 0.44–1.00)
GFR calc Af Amer: 31 mL/min — ABNORMAL LOW (ref >60)
GFR calc non Af Amer: 27 mL/min — ABNORMAL LOW (ref >60)
GLUCOSE: 166 mg/dL — AB (ref 65–99)
Potassium: 2.9 mmol/L — ABNORMAL LOW (ref 3.5–5.1)
SODIUM: 140 mmol/L (ref 135–145)
Total Bilirubin: 0.9 mg/dL (ref 0.3–1.2)
Total Protein: 7 g/dL (ref 6.5–8.1)

## 2015-02-18 LAB — CBC
HCT: 37.5 % (ref 36.0–46.0)
Hemoglobin: 12.3 g/dL (ref 12.0–15.0)
MCH: 32 pg (ref 26.0–34.0)
MCHC: 32.8 g/dL (ref 30.0–36.0)
MCV: 97.7 fL (ref 78.0–100.0)
Platelets: 207 10*3/uL (ref 150–400)
RBC: 3.84 MIL/uL — AB (ref 3.87–5.11)
RDW: 13.4 % (ref 11.5–15.5)
WBC: 5.8 10*3/uL (ref 4.0–10.5)

## 2015-02-18 LAB — TROPONIN I: TROPONIN I: 0.03 ng/mL (ref <0.031)

## 2015-02-18 MED ORDER — POTASSIUM CHLORIDE CRYS ER 20 MEQ PO TBCR
60.0000 meq | EXTENDED_RELEASE_TABLET | Freq: Once | ORAL | Status: AC
  Administered 2015-02-18: 60 meq via ORAL
  Filled 2015-02-18: qty 3

## 2015-02-18 NOTE — ED Notes (Signed)
CMET recollected and sent to lab

## 2015-02-18 NOTE — ED Notes (Signed)
Pt from home c/o chest pain with dizziness while taking out the trash. Pt has hx of MI and stroke. Pt is A&O and in NAD

## 2015-02-18 NOTE — ED Notes (Signed)
Pt from home via EMS c/o chest tightness and dizziness. Pt denies SOB, radiation, N/V or diaphoresis. Pt is A&O and in NAD. Pt adds that she has had cough x2 months

## 2015-02-18 NOTE — ED Notes (Signed)
Patient transported to CT 

## 2015-02-18 NOTE — ED Notes (Signed)
Bed: WA17 Expected date: 02/18/15 Expected time: 10:44 AM Means of arrival: Ambulance Comments: 81 yo "dizzy spells"

## 2015-02-18 NOTE — Discharge Instructions (Signed)
As discussed, your evaluation today has been largely reassuring.  But, it is important that you monitor your condition carefully, and do not hesitate to return to the ED if you develop new, or concerning changes in your condition.  Otherwise, please follow-up with your physician for appropriate ongoing care.  Please be sure to arrange additional laboratory evaluation to insure that your low potassium level has improved.

## 2015-02-18 NOTE — ED Provider Notes (Signed)
CSN: [id number]     Arrival date & time 02/18/15  1047 History   First MD Initiated Contact with Patient 02/18/15 1059     Chief Complaint  Patient presents with  . Chest Pain     (Consider location/radiation/quality/duration/timing/severity/associated sxs/prior Treatment) HPI  Patient presents with concern of dizziness, new chest tightness. Patient was well prior to this morning. About 2 hours ago, while taking the trash she felt dizzy, disequilibrium, not near syncope. No fall, syncope. Subsequent, that symptom improved. However, upon entering the insulins, the patient developed chest tightness. This is also improved since onset, though is minimally present. There is mild associated nausea, but no vomiting, dyspnea, dysesthesia or weakness anywhere. No recent medication changes. Patient has been taking all medication as directed, site not taking Plavix today.  Past Medical History  Diagnosis Date  . Peripheral vascular disease 2010    Known distal left SFA occlusion, known right posterior tibial artery occlusion as well as left posterior and anterior tibial artery occlusion.; Most recent Dopplers November 2012: Patent right SFA stent, left ABI 0.56  . Coronary artery disease - history of MI October 2006    Severe RCA lesion -- 2.5 Mm x 12 Mm Vision BMS; January 2007 -. Distal stent stenosis in RCA treated with 2.5 mm x y 12 mm Vision BMS stent distally;  circumflex/OM stenosis -- 2.5 mg 12 mm Mini Vision BMS ;  most recent cath October 11: 100% RCA occlusion with ISR. Left right collaterals. Patent to the extent, the 70% AV groove circumflex. Apical LAD stenosis 70-80 %, diffuse disease; EF 65-70%   . Aortic valve stenosis, senile calcific January 2011    Echo: Normal EF 60-65%, mild aortic stenosis - estimated valve area 1.1 cm -- recently followed up with new echocardiogram  . Chronic kidney disease 1964  . Hypertension     Previously on multiple medications now on only clonidine   . Non-ST elevation MI (NSTEMI) 2006, 2011  . DVT (deep venous thrombosis)   . Aneurysm   . Anemia   . Cancer 1963    ovarian cancer  . Arthritis   . Bilateral renal artery stenosis 2006 Deon Pilling)    - Right renal artery 60% stenosis March '13  . Dyslipidemia, goal LDL below 70      no longer on statin  . Cerebral aneurysm without rupture     3 mm; followed by Dr. Arnoldo Morale as well as vascular surgery  . Stroke Feb. 2015    Carotid artery Doppler show right internal carotid 60-80% stenosis. Previously followed by Dr. Donzetta Kohut  . Carotid artery occlusion   . Fall at home Jan. 22, 2016   Past Surgical History  Procedure Laterality Date  . Abdominal hysterectomy    . Bladder surgery      bladder tack  . Breast lumpectomy      left  . Appendectomy    . Femoral artery stent Right 2006, 2011, 2012    Atherectomy with PTA alone in 2006; April 2000: Right SFA - overlapping stents (6.0 mm x 170 mm x2 and 6.0 mm x 40 mm); [place] PTA of right SFA ISR -- patent by Dopplers and cath in 2012  . Renal artery stent Bilateral 2006    Dr. Deon Pilling; patent as of 2012 cath  . Iliac artery stent Right 2006  . Popliteal artery angioplasty  July 04, 2011    Done in the same setting as PTA of ISR in right SFA  .  Doppler echocardiography  09/25/2009    ef 60 to 65%,aotric valve mild stenosis 1.1cm^2(VTI)  . Nm myocar perf wall motion  10/13/2009    EF 73%  . Coronary angioplasty with stent placement  05/27/2005    RCA - MiniVision BMS 2.5 mm x 12 mm  . Coronary angioplasty with stent placement Bilateral 01/23/20072007    Circumflex-OM -- 2.5 mm x 12 mm Mini Vision BMS; RCA distal to previous stent 2.5 mm x 12 mm Mini Vision BMS  . Cardiac catheterization  05/28/2010    EF 65-70%,multi vessel CAD  . Carotid doppler  02/04/2012; January 2015    Done at [facility]----right internal 60 to 79%,bilateral external patent,bilateral common carotid artery tortuosity present  . Renal doppler  11/13/2011     bil.renal arterial stents patent w/less than 60%,right and left kidneys equal in size  . Lower arterial extrem. doppler  07/23/2011    ABI RIGHT .92 ,LEFT ABI .79 ,RGT PROX.SFA STENT 0-49%,RGT SFA  W/O EVIDENCE OF SIGNIFICANT DIAMETER REDUCTION,RGT POPLITEAL ARTERY 50% NARROWING   . Transthoracic echocardiogram  September 25 2013    Mild concentric LVH ;; EF 60-65%. Increased left atrial pressure. Mild-moderate aortic stenosis with trivial regurgitation. Severe MAC with mild stenosis. Mild LA dilation. PA pressures mildly elevated at 42 mmHg.  Marland Kitchen Lower extremity angiogram Left 07/04/2011    Procedure: LOWER EXTREMITY ANGIOGRAM;  Surgeon: Lorretta Harp, MD;  Location[facility] CATH LAB;  Service: Cardiovascular;  Laterality: Left;   Family History  Problem Relation Age of Onset  . Irritable bowel syndrome Daughter   . Colon cancer Neg Hx   . Cancer Mother     Ovarian  . Heart attack Father    History  Substance Use Topics  . Smoking status: Never Smoker   . Smokeless tobacco: Never Used  . Alcohol Use: No     Comment: social drinker  years ago-no longer   OB History    No data available     Review of Systems  Constitutional:       Per HPI, otherwise negative  HENT:       Per HPI, otherwise negative  Respiratory:       Per HPI, otherwise negative  Cardiovascular:       Per HPI, otherwise negative  Gastrointestinal: Negative for vomiting.  Endocrine:       Negative aside from HPI  Genitourinary:       Neg aside from HPI   Musculoskeletal:       Per HPI, otherwise negative  Skin: Negative.   Neurological: Negative for syncope.      Allergies  Codeine  Home Medications   Prior to Admission medications   Medication Sig Start Date End Date Taking? Authorizing Provider  acetaminophen (TYLENOL) 500 MG tablet Take 500 mg by mouth as needed for pain.    Historical Provider, MD  amLODipine (NORVASC) 5 MG tablet Take 1 tablet (5 mg total) by mouth daily. Patient not taking:  Reported on 10/07/2014 09/26/14   Leonie Man, MD  amoxicillin (AMOXIL) 875 MG tablet Take 1 tablet (875 mg total) by mouth 2 (two) times daily. Patient not taking: Reported on 10/07/2014 09/22/14   Denita Lung, MD  amoxicillin-clavulanate (AUGMENTIN) 875-125 MG per tablet Take 1 tablet by mouth 2 (two) times daily. 10/18/14   Denita Lung, MD  aspirin 81 MG EC tablet Take 1 tablet (81 mg total) by mouth daily. Swallow whole. 09/25/13   Debbe Odea, MD  atorvastatin (LIPITOR) 10 MG tablet Take 10 mg by mouth every other day. 05/05/13   Denita Lung, MD  atorvastatin (LIPITOR) 10 MG tablet take 1 tablet by mouth once daily 12/12/14   Denita Lung, MD  carvedilol (COREG) 6.25 MG tablet Take 1 tablet (6.25 mg total) by mouth 2 (two) times daily with a meal. 11/09/14   Leonie Man, MD  chlorthalidone (HYGROTON) 25 MG tablet take 1/2 tablet by mouth once daily 07/28/14   Rockne Menghini, RPH-CPP  clopidogrel (PLAVIX) 75 MG tablet Take 1 tablet (75 mg total) by mouth daily. 11/23/14   Casimiro Needle [place], RPH-CPP  Propylene Glycol-Glycerin (SOOTHE OP) Place 1 drop into both eyes daily.     Historical Provider, MD   BP 174/81 mmHg  Pulse 76  Temp(Src) 98.1 F (36.7 C) (Oral)  Resp 18  SpO2 94% Physical Exam  Constitutional: She is oriented to person, place, and time. She appears well-developed and well-nourished. No distress.  HENT:  Head: Normocephalic and atraumatic.  Eyes: Conjunctivae and EOM are normal.  Cardiovascular: Normal rate and regular rhythm.   Pulmonary/Chest: Effort normal and breath sounds normal. No stridor. No respiratory distress.  Abdominal: She exhibits no distension.  Musculoskeletal: She exhibits no edema.  Neurological: She is alert and oriented to person, place, and time. She displays no atrophy and no tremor. No cranial nerve deficit or sensory deficit. She exhibits normal muscle tone. She displays no seizure activity. Coordination normal.  Skin: Skin is warm and  dry.  Psychiatric: She has a normal mood and affect.  Nursing note and vitals reviewed.   ED Course  Procedures (including critical care time) Labs Review Labs Reviewed  CBC - Abnormal; Notable for the following:    RBC 3.84 (*)    All other components within normal limits  PROTIME-INR  COMPREHENSIVE METABOLIC PANEL  TROPONIN I    Imaging Review Dg Chest 2 View  02/18/2015   CLINICAL DATA:  Left upper chest pain  EXAM: CHEST  2 VIEW  COMPARISON:  09/24/2013  FINDINGS: Mild cardiac enlargement. No pleural effusion or edema. Chronic bronchitic changes noted. No airspace consolidation. Mild spondylosis noted within the thoracic spine.  IMPRESSION: 1. No acute cardiopulmonary abnormalities.   Electronically Signed   By: Kerby Moors M.D.   On: 02/18/2015 11:44     EKG Interpretation   Date/Time:  Saturday February 18 2015 10:48:54 EDT Ventricular Rate:  68 PR Interval:  182 QRS Duration: 75 QT Interval:  406 QTC Calculation: 432 R Axis:   -6 Text Interpretation:  Sinus rhythm Borderline repol abnrm, anterolateral  leads Sinus rhythm T wave abnormality Artifact Abnormal ekg Confirmed by  Carmin Muskrat  MD ([id number]) on 02/18/2015 11:00:08 AM     Cardiac 70 sinus normal Pulse ox 95% room air normal   12:54 PM Labs notable for hypokalemia. This was repleted.  2:07 PM Patient with no active complaints, will follow-up with primary care for repeat blood draw.  MDM   Patient presents after episodic dizziness, chest pressure.  Patient has unremarkable EKG, is already medically managed for CAD, with Plavix and aspirin. With elevation, low suspicion for ongoing coronary ischemia, patient unlikely to benefit from additional cardiac evaluation. patient's evaluation here is most notable for the initiation of hypokalemia, likely contributory to her dizziness patient also has multiple medical issues, making this a multifactorial presentation, without acute findings beyond the low  potassium level. patient received repletion, and with hours of monitoring, with no  decompensation, she was discharged in stable condition to have repeat labs performed in 2 days evaluation with primary care in 2 days.    Carmin Muskrat, MD 02/18/15 1407

## 2015-02-20 ENCOUNTER — Ambulatory Visit (INDEPENDENT_AMBULATORY_CARE_PROVIDER_SITE_OTHER): Payer: Medicare Other | Admitting: Family Medicine

## 2015-02-20 ENCOUNTER — Encounter: Payer: Self-pay | Admitting: Family Medicine

## 2015-02-20 VITALS — BP 110/68 | HR 68 | Wt 150.2 lb

## 2015-02-20 DIAGNOSIS — R42 Dizziness and giddiness: Secondary | ICD-10-CM | POA: Diagnosis not present

## 2015-02-20 DIAGNOSIS — E871 Hypo-osmolality and hyponatremia: Secondary | ICD-10-CM

## 2015-02-20 LAB — BASIC METABOLIC PANEL
BUN: 26 mg/dL — ABNORMAL HIGH (ref 6–23)
CALCIUM: 10.5 mg/dL (ref 8.4–10.5)
CHLORIDE: 102 meq/L (ref 96–112)
CO2: 29 mEq/L (ref 19–32)
Creat: 1.78 mg/dL — ABNORMAL HIGH (ref 0.50–1.10)
GLUCOSE: 110 mg/dL — AB (ref 70–99)
Potassium: 3.4 mEq/L — ABNORMAL LOW (ref 3.5–5.3)
Sodium: 141 mEq/L (ref 135–145)

## 2015-02-20 NOTE — Progress Notes (Signed)
   Subjective:    Patient ID: [name], female    DOB: [birth year], 81 y.o.   MRN: [id number]  HPI She is here for follow-up visit. She was seen in the emergency room recently for evaluation of dizziness as well as chest pain she. The only thing the thumb was hyponatremia at 129. Since then she has done fairly well but does have these episodes of dizziness that can last just for several seconds. She cannot relate these to any head position, physical activity. She's had no chest pain, weakness, blurred or double vision or headache associated with this.   Review of Systems     Objective:   Physical Exam Alert and in no distress. Tympanic membranes and canals are normal. Pharyngeal area is normal. Neck is supple without adenopathy or thyromegaly. Cardiac exam shows a regular sinus rhythm without murmurs or gallops. Lungs are clear to auscultation.        Assessment & Plan:  Dizziness - Plan: Basic Metabolic Panel  Hyponatremia - Plan: Basic Metabolic Panel, CANCELED: Basic Metabolic Panel I reassured her that I did not think she was in any danger especially since this tends to go away fairly quickly.She will keep in touch with me on this. I will let her know about her sodium level.

## 2015-03-27 ENCOUNTER — Telehealth: Payer: Self-pay | Admitting: Family Medicine

## 2015-03-27 NOTE — Telephone Encounter (Signed)
Let her know that she can get enough potassium if she uses [place] from the grocery store about a teaspoon per day. Explained that she needs to use this like a very potent salt as it is quite bitter.

## 2015-03-27 NOTE — Telephone Encounter (Signed)
Pt called and stated she would like a rx for potassium. She states she can't eat any more bananas . She states she gotten so she can't stand the sight of them. She is requesting that she be given a rx instead. Pt uses [facility] on [address] and can be reached at [phone].

## 2015-03-27 NOTE — Telephone Encounter (Signed)
Pt informed and verbalized understanding

## 2015-04-03 ENCOUNTER — Other Ambulatory Visit: Payer: Self-pay | Admitting: *Deleted

## 2015-04-03 DIAGNOSIS — I6523 Occlusion and stenosis of bilateral carotid arteries: Secondary | ICD-10-CM

## 2015-04-10 ENCOUNTER — Ambulatory Visit: Payer: Medicare Other | Admitting: Family

## 2015-04-10 ENCOUNTER — Encounter (HOSPITAL_COMMUNITY): Payer: Medicare Other

## 2015-04-20 ENCOUNTER — Ambulatory Visit: Payer: Medicare Other | Admitting: Family

## 2015-04-20 ENCOUNTER — Encounter (HOSPITAL_COMMUNITY): Payer: Medicare Other

## 2015-05-29 ENCOUNTER — Other Ambulatory Visit: Payer: Self-pay | Admitting: Family Medicine

## 2015-06-21 ENCOUNTER — Encounter: Payer: Self-pay | Admitting: Cardiology

## 2015-06-21 ENCOUNTER — Ambulatory Visit (INDEPENDENT_AMBULATORY_CARE_PROVIDER_SITE_OTHER): Payer: Medicare Other | Admitting: Cardiology

## 2015-06-21 VITALS — BP 140/76 | HR 81 | Ht 63.0 in | Wt 150.2 lb

## 2015-06-21 DIAGNOSIS — I739 Peripheral vascular disease, unspecified: Secondary | ICD-10-CM | POA: Diagnosis not present

## 2015-06-21 DIAGNOSIS — N183 Chronic kidney disease, stage 3 unspecified: Secondary | ICD-10-CM

## 2015-06-21 DIAGNOSIS — I25810 Atherosclerosis of coronary artery bypass graft(s) without angina pectoris: Secondary | ICD-10-CM

## 2015-06-21 DIAGNOSIS — I10 Essential (primary) hypertension: Secondary | ICD-10-CM

## 2015-06-21 DIAGNOSIS — I350 Nonrheumatic aortic (valve) stenosis: Secondary | ICD-10-CM

## 2015-06-21 DIAGNOSIS — M79604 Pain in right leg: Secondary | ICD-10-CM | POA: Diagnosis not present

## 2015-06-21 NOTE — Assessment & Plan Note (Signed)
She complains of Rt leg, knee, and ankle pain. Not clear if this is claudication or not.

## 2015-06-21 NOTE — Progress Notes (Signed)
06/21/2015 [name]   1924/01/04  [id number]  Primary Physician Wyatt Haste, MD Primary Cardiologist: Dr Harding/ Dr Gwenlyn Found  HPI:  Delightful 81 y/o AA feamle with a history of CAD, PVD, CRI, and AS. She has been followed by Dr Kellie Simmering and Dr Gwenlyn Found. No prior intervention for PVD. Dopplers 11/15 suggests widespread LE PVD with high grade distal Rt SFA and occluded Lt SFA. She is in the office today with complaints of Rt knee, ankle, and foot pain. It is not clearly claudication but it does not seem to bother her at rest. It does seem to be worse "just starting out". Some days she may not have it at all.    Current Outpatient Prescriptions  Medication Sig Dispense Refill  . acetaminophen (TYLENOL) 500 MG tablet Take 500 mg by mouth as needed for pain.    Marland Kitchen aspirin 81 MG EC tablet Take 1 tablet (81 mg total) by mouth daily. Swallow whole. 30 tablet 12  . atorvastatin (LIPITOR) 10 MG tablet take 1 tablet by mouth once daily 90 tablet 1  . carvedilol (COREG) 6.25 MG tablet Take 1 tablet (6.25 mg total) by mouth 2 (two) times daily with a meal. 60 tablet 8  . chlorthalidone (HYGROTON) 25 MG tablet take 1/2 tablet by mouth once daily 15 tablet 11  . clopidogrel (PLAVIX) 75 MG tablet Take 1 tablet (75 mg total) by mouth daily. 30 tablet 8  . Propylene Glycol-Glycerin (SOOTHE OP) Place 1 drop into both eyes daily.     Marland Kitchen amLODipine (NORVASC) 5 MG tablet Take 5 mg by mouth daily.  1   No current facility-administered medications for this visit.    Allergies  Allergen Reactions  . Codeine Rash    Social History   Social History  . Marital Status: Widowed    Spouse Name: N/A  . Number of Children: 6  . Years of Education: N/A   Occupational History  . Not on file.   Social History Main Topics  . Smoking status: Never Smoker   . Smokeless tobacco: Never Used  . Alcohol Use: No     Comment: social drinker  years ago-no longer  . Drug Use: No  . Sexual Activity: No     Other Topics Concern  . Not on file   Social History Narrative   She is a widowed mother of 78. She has 32 grandchildren and great-grandchildren   combined. She is very active. Never smoked. She has not had a drink in over 30 years.     Review of Systems: General: negative for chills, fever, night sweats or weight changes.  Cardiovascular: negative for chest pain, dyspnea on exertion, edema, orthopnea, palpitations, paroxysmal nocturnal dyspnea or shortness of breath Dermatological: negative for rash Respiratory: negative for cough or wheezing Urologic: negative for hematuria Abdominal: negative for nausea, vomiting, diarrhea, bright red blood per rectum, melena, or hematemesis Neurologic: negative for visual changes, syncope, or dizziness All other systems reviewed and are otherwise negative except as noted above.    Blood pressure 140/76, pulse 81, height 5\' 3"  (1.6 m), weight 150 lb 3.2 oz (68.13 kg).  General appearance: alert, cooperative, no distress and moderately obese Neck: no JVD and transmitted bruit vs AS murmur Lungs: clear to auscultation bilaterally Heart: regular rate and rhythm and 2/6 systolic murmur LSB and AOV Extremities: no edema, no ulcers Pulses: diminnsihed bilatereal LE pulses. Rt foot slightly cooler than Lt Skin: Skin color, texture, turgor normal. No rashes or  lesions Neurologic: Grossly normal  EKG NSR  ASSESSMENT AND PLAN:   Leg pain, right She complains of Rt leg, knee, and ankle pain. Not clear if this is claudication or not.  Peripheral vascular disease ([place]) Known distal left SFA occlusion, known right posterior tibial artery occlusion as well as left posterior and anterior tibial artery occlusion.; Most recent Dopplers suggest high grade Rt SFA diseae  Coronary artery disease RCA BMS 2007, occluded in 2011 with residual  70% AV groove circumflex. Apical LAD stenosis 70-80 %, diffuse disease; EF 65-70%  No angina  Aortic valve  stenosis, senile calcific Mild to moderate AS  CKD (chronic kidney disease) stage 3, GFR 30-59 ml/min SCr 1.5   PLAN  The pt was seen by Dr berry and myself. The plan is for conservative Rx. She would be high risk for any intervention. We recommended Tylenol PRN (No Motrin). She has a /u in Dec with Dr harding and will keep that.  Kerin Ransom K PA-C 06/21/2015 2:44 PM

## 2015-06-21 NOTE — Assessment & Plan Note (Signed)
Known distal left SFA occlusion, known right posterior tibial artery occlusion as well as left posterior and anterior tibial artery occlusion.; Most recent Dopplers suggest high grade Rt SFA diseae

## 2015-06-21 NOTE — Assessment & Plan Note (Signed)
SCr 1.5  

## 2015-06-21 NOTE — Assessment & Plan Note (Signed)
RCA BMS 2007, occluded in 2011 with residual  70% AV groove circumflex. Apical LAD stenosis 70-80 %, diffuse disease; EF 65-70%  No angina

## 2015-06-21 NOTE — Assessment & Plan Note (Signed)
Mild to moderate AS

## 2015-06-21 NOTE — Patient Instructions (Addendum)
Your physician has recommended you make the following change in your medication:  You can take tylenol 500 mg  1 tablet 4 times a day for pain.  Your physician recommends that you keep your follow-up appointment in December with Dr. Ellyn Hack.  If you need a refill on your cardiac medications before your next appointment, please call your pharmacy.

## 2015-06-30 ENCOUNTER — Encounter: Payer: Self-pay | Admitting: Family

## 2015-07-03 ENCOUNTER — Ambulatory Visit: Payer: Medicare Other | Admitting: Family

## 2015-07-03 ENCOUNTER — Encounter (HOSPITAL_COMMUNITY): Payer: Medicare Other

## 2015-07-03 ENCOUNTER — Other Ambulatory Visit: Payer: Self-pay | Admitting: Vascular Surgery

## 2015-07-03 DIAGNOSIS — Z8673 Personal history of transient ischemic attack (TIA), and cerebral infarction without residual deficits: Secondary | ICD-10-CM

## 2015-07-03 DIAGNOSIS — I6523 Occlusion and stenosis of bilateral carotid arteries: Secondary | ICD-10-CM

## 2015-07-14 ENCOUNTER — Other Ambulatory Visit: Payer: Self-pay | Admitting: Cardiology

## 2015-07-14 DIAGNOSIS — I739 Peripheral vascular disease, unspecified: Secondary | ICD-10-CM

## 2015-07-24 ENCOUNTER — Ambulatory Visit (HOSPITAL_COMMUNITY)
Admission: RE | Admit: 2015-07-24 | Discharge: 2015-07-24 | Disposition: A | Discharge status: Home / Self Care | Payer: Medicare Other | Source: Ambulatory Visit | Attending: Cardiology | Admitting: Cardiology

## 2015-07-24 DIAGNOSIS — Y838 Other surgical procedures as the cause of abnormal reaction of the patient, or of later complication, without mention of misadventure at the time of the procedure: Secondary | ICD-10-CM | POA: Insufficient documentation

## 2015-07-24 DIAGNOSIS — I708 Atherosclerosis of other arteries: Secondary | ICD-10-CM | POA: Diagnosis not present

## 2015-07-24 DIAGNOSIS — N189 Chronic kidney disease, unspecified: Secondary | ICD-10-CM | POA: Insufficient documentation

## 2015-07-24 DIAGNOSIS — I739 Peripheral vascular disease, unspecified: Secondary | ICD-10-CM | POA: Insufficient documentation

## 2015-07-24 DIAGNOSIS — R938 Abnormal findings on diagnostic imaging of other specified body structures: Secondary | ICD-10-CM | POA: Diagnosis not present

## 2015-07-24 DIAGNOSIS — I700 Atherosclerosis of aorta: Secondary | ICD-10-CM | POA: Diagnosis not present

## 2015-07-24 DIAGNOSIS — I129 Hypertensive chronic kidney disease with stage 1 through stage 4 chronic kidney disease, or unspecified chronic kidney disease: Secondary | ICD-10-CM | POA: Diagnosis not present

## 2015-07-24 DIAGNOSIS — T82856A Stenosis of peripheral vascular stent, initial encounter: Secondary | ICD-10-CM | POA: Diagnosis not present

## 2015-07-31 ENCOUNTER — Other Ambulatory Visit: Payer: Self-pay | Admitting: Pharmacist Clinician (PhC)/ Clinical Pharmacy Specialist

## 2015-08-01 NOTE — Telephone Encounter (Signed)
Rx(s) sent to pharmacy electronically.  

## 2015-08-03 ENCOUNTER — Telehealth: Payer: Self-pay | Admitting: *Deleted

## 2015-08-03 DIAGNOSIS — I739 Peripheral vascular disease, unspecified: Secondary | ICD-10-CM

## 2015-08-03 NOTE — Telephone Encounter (Signed)
-----   Message from Leonie Man, MD sent at 07/31/2015  5:59 PM EST ----- Impressions  - stable findings Right ABI is in the moderate range.  Essentially stable left ABI, still in the moderate range.  Abnormal great toe-brachial indices, bilaterally.  See Arterial Duplex study.  f/u 1 year

## 2015-08-03 NOTE — Telephone Encounter (Signed)
Placed order for 2017 doppler

## 2015-08-03 NOTE — Telephone Encounter (Signed)
Spoke to patient. Result given . Verbalized understanding  

## 2015-08-17 ENCOUNTER — Telehealth: Payer: Self-pay | Admitting: Family

## 2015-08-17 NOTE — Telephone Encounter (Signed)
Recvd VM from pt on 12.22.16 @ 9:41 that she would like to speak with someone to reschedule her appt in Jan. She asked that I call [phone].  Call placed at 10:06am to [phone], no answer, no voicemail. dpm

## 2015-08-18 ENCOUNTER — Encounter: Payer: Medicare Other | Admitting: Family Medicine

## 2015-08-23 ENCOUNTER — Ambulatory Visit: Payer: Medicare Other | Admitting: Cardiology

## 2015-08-25 ENCOUNTER — Other Ambulatory Visit: Payer: Self-pay | Admitting: Cardiology

## 2015-08-25 ENCOUNTER — Other Ambulatory Visit: Payer: Self-pay | Admitting: Pharmacist Clinician (PhC)/ Clinical Pharmacy Specialist

## 2015-08-25 NOTE — Telephone Encounter (Signed)
Rx request sent to pharmacy.  

## 2015-09-01 ENCOUNTER — Encounter: Payer: Self-pay | Admitting: Family

## 2015-09-05 ENCOUNTER — Encounter (HOSPITAL_COMMUNITY): Payer: Medicare Other

## 2015-09-05 ENCOUNTER — Ambulatory Visit: Payer: Medicare Other | Admitting: Family

## 2015-09-07 ENCOUNTER — Ambulatory Visit: Payer: Medicare Other | Admitting: Family Medicine

## 2015-10-03 ENCOUNTER — Ambulatory Visit (INDEPENDENT_AMBULATORY_CARE_PROVIDER_SITE_OTHER): Payer: Medicare Other | Admitting: Cardiology

## 2015-10-03 VITALS — BP 116/66 | HR 82 | Ht 63.0 in | Wt 150.1 lb

## 2015-10-03 DIAGNOSIS — I10 Essential (primary) hypertension: Secondary | ICD-10-CM

## 2015-10-03 DIAGNOSIS — E785 Hyperlipidemia, unspecified: Secondary | ICD-10-CM

## 2015-10-03 DIAGNOSIS — I350 Nonrheumatic aortic (valve) stenosis: Secondary | ICD-10-CM

## 2015-10-03 DIAGNOSIS — I2510 Atherosclerotic heart disease of native coronary artery without angina pectoris: Secondary | ICD-10-CM

## 2015-10-03 DIAGNOSIS — I739 Peripheral vascular disease, unspecified: Secondary | ICD-10-CM

## 2015-10-03 DIAGNOSIS — I2111 ST elevation (STEMI) myocardial infarction involving right coronary artery: Secondary | ICD-10-CM

## 2015-10-03 NOTE — Patient Instructions (Signed)
Your physician has requested that you have an echocardiogram [facility] . Echocardiography is a painless test that uses sound waves to create images of your heart. It provides your doctor with information about the size and shape of your heart and how well your heart's chambers and valves are working. This procedure takes approximately one hour. There are no restrictions for this procedure.  NO OTHER CHANGES TODAY   Your physician recommends that you schedule a follow-up appointment in [place] physician wants you to follow-up in [place]. You will receive a reminder letter in the mail two months in advance. If you don't receive a letter, please call our office to schedule the follow-up appointment.  If you need a refill on your cardiac medications before your next appointment, please call your pharmacy.

## 2015-10-03 NOTE — Progress Notes (Signed)
PCP: Wyatt Haste, MD  Clinic Note: Chief Complaint  Patient presents with  . Follow-up    2 month//HTN//lower extremity doppler--pt c/o fatigue, and swelling in bilateral feet  . Aortic Stenosis  . Coronary Artery Disease  . PAD    HPI: MILICA MOHRBACHER is a 82 y.o. female with a PMH below who presents today for ~3-4 month f/u. She has an extensive history of CAD, PVD, CRI, and AS. She has been followed by Dr Kellie Simmering and Dr Gwenlyn Found. No prior intervention for PVD. Dopplers 11/15 suggests widespread LE PVD with high grade distal Rt SFA and occluded Lt SFA.  I have not seen her since February 2015. She saw Tarri Fuller in November 2015.  TYANI GERATY was last seen on Jun 21, 2015 by Mr. Rosalyn Gess, [place] - for evaluation of right knee, ankle and foot pain that was probably not claudication. Arterial Dopplers were performed there are relatively stable.  Recent Hospitalizations: None  Studies Reviewed:   Lower Extremity Arterial Dopplers November 2016 : Right ABI moderate, stable left ABI still moderate. Abnormal great toe brachial indices. Mild atherosclerosis in the distal aorta and bilateral common and external iliac arteries. 50-74% in-stent stenosis in the right mid SFA. Occlusion right distal SFA with immediate reconstitution via collateral flow. Two vessel run-off in the right lower extremity  March 2013 renal Dopplers: Stable kidney artery stents  Interval History: She presents today really doing okay. She has little bit of fatigue, but denies any real claudication type symptoms. She has some occasional swelling in her feet when she's been on her feet walking a long time. She went for a long walk yesterday, and has some swelling today, but had no claudication. Is really her right knee that limits her activity. She has occasional gas bloating sensation across her chest that is relieved with burping. Is not made worse with exertion. It's usually after she eats certain  things. She really denies any cardiac symptoms of angina or PND orthopnea. She does get some exertional dyspnea if she goes fast, but it best not a change for her. She denies a rapid irregular heartbeat/palpitations.  1 blackout spell - associated with Hypokalemia, otherwise No syncope/near syncope or TIA/amaurosis fugax symptoms. No melena, hematochezia, hematuria, or epstaxis.   ROS: A comprehensive was performed. Review of Systems  Constitutional: Positive for malaise/fatigue (Just not as active and energetic as usual.- She says is because she is 89).  HENT: Negative for nosebleeds.   Respiratory: Negative for shortness of breath and wheezing.   Cardiovascular: Negative for claudication.  Gastrointestinal: Negative for blood in stool and melena.  Genitourinary: Negative for hematuria.  Musculoskeletal: Positive for joint pain (Knee). Negative for myalgias and falls.  Neurological: Positive for dizziness (sometimes positional). Negative for headaches.  Psychiatric/Behavioral: Negative for memory loss. The patient does not have insomnia.   All other systems reviewed and are negative.    Past Medical History  Diagnosis Date  . Peripheral vascular disease ([place]) 2010    Known distal left SFA occlusion, known right posterior tibial artery occlusion as well as left posterior and anterior tibial artery occlusion.; Most recent Dopplers November 2012: Patent right SFA stent, left ABI 0.56  . Coronary artery disease - history of MI October 2006    Severe RCA lesion -- 2.5 Mm x 12 Mm Vision BMS; January 2007 -. Distal stent stenosis in RCA treated with 2.5 mm x y 12 mm Vision BMS stent distally;  circumflex/OM stenosis -- 2.5 mg  12 mm Mini Vision BMS ;  most recent cath October 11: 100% RCA occlusion with ISR. Left right collaterals. Patent to the extent, the 70% AV groove circumflex. Apical LAD stenosis 70-80 %, diffuse disease; EF 65-70%   . Aortic valve stenosis, senile calcific January 2011     Echo: Normal EF 60-65%, mild aortic stenosis - estimated valve area 1.1 cm -- recently followed up with new echocardiogram  . Chronic kidney disease 1964  . Hypertension     Previously on multiple medications now on only clonidine  . Non-ST elevation MI (NSTEMI) ([place]) 2006, 2011  . DVT (deep venous thrombosis) ([place])   . Aneurysm ([place])   . Anemia   . Cancer [facility]) 1963    ovarian cancer  . Arthritis   . Bilateral renal artery stenosis ([place]) 2006 ([place])    - Right renal artery 60% stenosis March '13  . Dyslipidemia, goal LDL below 70      no longer on statin  . Cerebral aneurysm without rupture     3 mm; followed by Dr. Arnoldo Morale as well as vascular surgery  . Stroke [facility]) Feb. 2015    Carotid artery Doppler show right internal carotid 60-80% stenosis. Previously followed by Dr. Donzetta Kohut  . Carotid artery occlusion   . Fall at home Jan. 22, 2016    Past Surgical History  Procedure Laterality Date  . Abdominal hysterectomy    . Bladder surgery      bladder tack  . Breast lumpectomy      left  . Appendectomy    . Femoral artery stent Right 2006, 2011, 2012    Atherectomy with PTA alone in 2006; April 2000: Right SFA - overlapping stents (6.0 mm x 170 mm x2 and 6.0 mm x 40 mm); [place] PTA of right SFA ISR -- patent by Dopplers and cath in 2012  . Renal artery stent Bilateral 2006    Dr. Deon Pilling; patent as of 2012 cath  . Iliac artery stent Right 2006  . Popliteal artery angioplasty  July 04, 2011    Done in the same setting as PTA of ISR in right SFA  . Doppler echocardiography  09/25/2009    ef 60 to 65%,aotric valve mild stenosis 1.1cm^2([facility])  . Nm myocar perf wall motion  10/13/2009    EF 73%  . Coronary angioplasty with stent placement  05/27/2005    RCA - MiniVision BMS 2.5 mm x 12 mm  . Coronary angioplasty with stent placement Bilateral 01/23/20072007    Circumflex-OM -- 2.5 mm x 12 mm Mini Vision BMS; RCA distal to previous stent 2.5 mm x 12 mm Mini Vision BMS   . Cardiac catheterization  05/28/2010    EF 65-70%,multi vessel CAD  . Carotid doppler  02/04/2012; January 2015    Done at [facility]----right internal 60 to 79%,bilateral external patent,bilateral common carotid artery tortuosity present  . Renal doppler  11/13/2011    bil.renal arterial stents patent w/less than 60%,right and left kidneys equal in size  . Lower arterial extrem. doppler  07/23/2011    ABI RIGHT .92 ,LEFT ABI .65 ,RGT PROX.SFA STENT 0-49%,RGT SFA  W/O EVIDENCE OF SIGNIFICANT DIAMETER REDUCTION,RGT POPLITEAL ARTERY 50% NARROWING   . Transthoracic echocardiogram  09/25/2013; 10/14/2014    a. Mild concentric LVH, EF 60-65%. Increased left atrial pressure. Mild-moderate aortic stenosis with trivial regurgitation. Severe MAC with mild stenosis. Mild LA dilation. PA pressures mildly elevated at 42 mmHg.;; b. Vigorous LV function, EF 60-70%. Dynamic outflow  tract obstruction. Midcavity obliteration. Peak intracavitary gradient  36 mmHg, moderate AS, Moderate-severely calcified m  . Lower extremity angiogram Left 07/04/2011    Procedure: LOWER EXTREMITY ANGIOGRAM;  Surgeon: Lorretta Harp, MD;  Location: [facility] CATH LAB;  Service: Cardiovascular;  Laterality: Left;    Prior to Admission medications   Medication Sig Start Date End Date Taking? Authorizing Provider  acetaminophen (TYLENOL) 500 MG tablet Take 500 mg by mouth as needed for pain.   Yes Historical Provider, MD  amLODipine (NORVASC) 5 MG tablet take 1 tablet by mouth once daily 08/25/15  Yes Leonie Man, MD  aspirin 81 MG EC tablet Take 1 tablet (81 mg total) by mouth daily. Swallow whole. 09/25/13  Yes Debbe Odea, MD  atorvastatin (LIPITOR) 10 MG tablet take 1 tablet by mouth once daily 05/29/15  Yes Denita Lung, MD  carvedilol (COREG) 6.25 MG tablet take 1 tablet by mouth twice a day with meals 08/25/15  Yes Leonie Man, MD  chlorthalidone (HYGROTON) 25 MG tablet take 1/2 tablet by mouth once daily 08/01/15  Yes Leonie Man, MD  clopidogrel (PLAVIX) 75 MG tablet take 1 tablet by mouth once daily 08/25/15  Yes Leonie Man, MD  Propylene Glycol-Glycerin (SOOTHE OP) Place 1 drop into both eyes daily.    Yes Historical Provider, MD   Allergies  Allergen Reactions  . Codeine Rash     Social History   Social History  . Marital Status: Widowed    Spouse Name: N/A  . Number of Children: 6  . Years of Education: N/A   Social History Main Topics  . Smoking status: Never Smoker   . Smokeless tobacco: Never Used  . Alcohol Use: No     Comment: social drinker  years ago-no longer  . Drug Use: No  . Sexual Activity: No   Other Topics Concern  . None   Social History Narrative   She is a widowed mother of 35. She has 32 grandchildren and great-grandchildren   combined. She is very active. Never smoked. She has not had a drink in over 30 years.   Family History  Problem Relation Age of Onset  . Irritable bowel syndrome Daughter   . Colon cancer Neg Hx   . Cancer Mother     Ovarian  . Heart attack Father      Wt Readings from Last 3 Encounters:  10/04/15 150 lb (68.04 kg)  10/03/15 150 lb 1.6 oz (68.085 kg)  06/21/15 150 lb 3.2 oz (68.13 kg)    PHYSICAL EXAM BP 116/66 mmHg  Pulse 82  Ht 5\' 3"  (1.6 m)  Wt 150 lb 1.6 oz (68.085 kg)  BMI 26.60 kg/m2 General appearance: alert, cooperative, appears stated age, no distress and otherwise healthy-appearing. Neck: no adenopathy, no carotid bruit and no JVD -- radiating aspirin murmur. Lungs: clear to auscultation bilaterally, normal percussion bilaterally and non-labored Heart: regular rate and rhythm, S1, S2 normal, no S3 or S4, systolic murmur: systolic ejection 3/6, crescendo, decrescendo and harsh at 2nd right intercostal space, radiates to carotids and no rub; nondisplaced PMI Abdomen: soft, non-tender; bowel sounds normal; no masses,  no organomegaly; Soft bruit noted in the midline , and more to the right; no femoral bruit  noted Extremities: extremities normal, atraumatic, no cyanosis, or edema Pulses: Diminished pedal pulses mostly on the left barely palpable. Right PT bounding with weak right DP pulsest. Bounding radial pulses bilaterally. Neurologic: Mental status: Alert, oriented, thought content  appropriate Cranial nerves: normal (II-XII grossly intact) - mild facial droop noted    Adult ECG Report Not checked  Other studies Reviewed: Additional studies/ records that were reviewed today include:  Recent Labs:  Due for lipid panel.   ASSESSMENT / PLAN: Overall relatively a symptomatically. No significant claudication or cardiac symptoms. Continue current management. She is due for follow-up echocardiogram.  Problem List Items Addressed This Visit    Peripheral vascular disease ([place]) (Chronic)    Known disease. Stable Dopplers. No active claudication symptoms. On stable cardiac medications which helps treat PVD. She has annual Dopplers ordered, we'll discuss with her whether or not she was continued surveillance Doppler or just doing for symptoms.      Relevant Orders   ECHOCARDIOGRAM COMPLETE   Non-Q wave ST elevation myocardial infarction (STEMI) involving right coronary artery (Chronic)    As far until 2 documented MIs in 2016 2011. No recurrent symptoms.  Relatively normal Myoview in 2011 which was the time of her last MI. No plans for further investigation unless she has symptoms.      Essential hypertension (Chronic)    Stable pressures. Continue current regimen. Be careful with chlorthalidone to avoid hyponatremia or hypokalemia.      Relevant Orders   ECHOCARDIOGRAM COMPLETE   Dyslipidemia, goal LDL below 70 (Chronic)    She is back on atorvastatin. She is due for lipid panel. Ordered for this week.      Relevant Orders   ECHOCARDIOGRAM COMPLETE   Coronary artery disease involving native heart without angina pectoris - Primary (Chronic)    Stable chronic disease - known RCA stent  occlusion with moderate disease in the circumflex and apical LAD.Marland Kitchen No active anginal symptoms. In the absence of symptoms, I would not evaluate with Myoview - last Myoview was negative for ischemia in 2011. She is on beta blocker, aspirin and Plavix along with statin. He is also on amlodipine for antianginal effect.      Relevant Orders   ECHOCARDIOGRAM COMPLETE   Aortic valve stenosis, senile calcific (Chronic)    Most recently described as moderate aortic stenosis. Also complicated by having intracavitary gradient. Due for annual follow-up echocardiogram. No current symptoms. She is on statin and adequate blood pressure control.      Relevant Orders   ECHOCARDIOGRAM COMPLETE      Current medicines are reviewed at length with the patient today. (+/- concerns) none The following changes have been made: None  NO OTHER CHANGES TODAY   Your physician recommends that you schedule a follow-up appointment in [place] physician wants you to follow-up in [place] Richardo Popoff.  Studies Ordered:   Orders Placed This Encounter  Procedures  . ECHOCARDIOGRAM COMPLETE      Kyandre Okray, Leonie Green, M.D., M.S. Interventional Cardiologist   Pager # [phone] Phone # [phone] [address] [place], [place] [address]

## 2015-10-04 ENCOUNTER — Encounter: Payer: Self-pay | Admitting: Family Medicine

## 2015-10-04 ENCOUNTER — Ambulatory Visit (INDEPENDENT_AMBULATORY_CARE_PROVIDER_SITE_OTHER): Payer: Medicare Other | Admitting: Family Medicine

## 2015-10-04 VITALS — BP 114/70 | HR 72 | Ht 63.0 in | Wt 150.0 lb

## 2015-10-04 DIAGNOSIS — R319 Hematuria, unspecified: Secondary | ICD-10-CM

## 2015-10-04 DIAGNOSIS — R252 Cramp and spasm: Secondary | ICD-10-CM | POA: Diagnosis not present

## 2015-10-04 DIAGNOSIS — I10 Essential (primary) hypertension: Secondary | ICD-10-CM | POA: Diagnosis not present

## 2015-10-04 DIAGNOSIS — E785 Hyperlipidemia, unspecified: Secondary | ICD-10-CM | POA: Diagnosis not present

## 2015-10-04 DIAGNOSIS — Z23 Encounter for immunization: Secondary | ICD-10-CM

## 2015-10-04 DIAGNOSIS — N183 Chronic kidney disease, stage 3 unspecified: Secondary | ICD-10-CM

## 2015-10-04 DIAGNOSIS — I701 Atherosclerosis of renal artery: Secondary | ICD-10-CM | POA: Diagnosis not present

## 2015-10-04 DIAGNOSIS — I739 Peripheral vascular disease, unspecified: Secondary | ICD-10-CM

## 2015-10-04 DIAGNOSIS — I350 Nonrheumatic aortic (valve) stenosis: Secondary | ICD-10-CM | POA: Diagnosis not present

## 2015-10-04 LAB — CBC WITH DIFFERENTIAL/PLATELET
BASOS PCT: 0 % (ref 0–1)
Basophils Absolute: 0 10*3/uL (ref 0.0–0.1)
EOS ABS: 0.3 10*3/uL (ref 0.0–0.7)
Eosinophils Relative: 5 % (ref 0–5)
HCT: 36 % (ref 36.0–46.0)
Hemoglobin: 12.2 g/dL (ref 12.0–15.0)
LYMPHS ABS: 1.9 10*3/uL (ref 0.7–4.0)
Lymphocytes Relative: 31 % (ref 12–46)
MCH: 32.9 pg (ref 26.0–34.0)
MCHC: 33.9 g/dL (ref 30.0–36.0)
MCV: 97 fL (ref 78.0–100.0)
MONOS PCT: 9 % (ref 3–12)
MPV: 10.9 fL (ref 8.6–12.4)
Monocytes Absolute: 0.5 10*3/uL (ref 0.1–1.0)
Neutro Abs: 3.4 10*3/uL (ref 1.7–7.7)
Neutrophils Relative %: 55 % (ref 43–77)
PLATELETS: 184 10*3/uL (ref 150–400)
RBC: 3.71 MIL/uL — ABNORMAL LOW (ref 3.87–5.11)
RDW: 13.4 % (ref 11.5–15.5)
WBC: 6.1 10*3/uL (ref 4.0–10.5)

## 2015-10-04 LAB — POCT URINALYSIS DIPSTICK
BILIRUBIN UA: NEGATIVE
Glucose, UA: NEGATIVE
Ketones, UA: NEGATIVE
NITRITE UA: NEGATIVE
PH UA: 6
Spec Grav, UA: >=1.03
Urobilinogen, UA: NEGATIVE

## 2015-10-04 LAB — COMPREHENSIVE METABOLIC PANEL
ALT: 9 U/L (ref 6–29)
AST: 14 U/L (ref 10–35)
Albumin: 4 g/dL (ref 3.6–5.1)
Alkaline Phosphatase: 73 U/L (ref 33–130)
BUN: 25 mg/dL (ref 7–25)
CHLORIDE: 100 mmol/L (ref 98–110)
CO2: 29 mmol/L (ref 20–31)
Calcium: 10.5 mg/dL — ABNORMAL HIGH (ref 8.6–10.4)
Creat: 1.67 mg/dL — ABNORMAL HIGH (ref 0.60–0.88)
GLUCOSE: 130 mg/dL — AB (ref 65–99)
POTASSIUM: 3.5 mmol/L (ref 3.5–5.3)
Sodium: 140 mmol/L (ref 135–146)
Total Bilirubin: 0.7 mg/dL (ref 0.2–1.2)
Total Protein: 6.8 g/dL (ref 6.1–8.1)

## 2015-10-04 LAB — LIPID PANEL
CHOLESTEROL: 191 mg/dL (ref 125–200)
HDL: 43 mg/dL — ABNORMAL LOW (ref >=46)
LDL Cholesterol: 121 mg/dL (ref <130)
Total CHOL/HDL Ratio: 4.4 Ratio (ref <=5.0)
Triglycerides: 135 mg/dL (ref <150)
VLDL: 27 mg/dL (ref <30)

## 2015-10-04 NOTE — Progress Notes (Signed)
Patient ID: IRJA MARRAZZO, female   DOB: 06/28/1924, 82 y.o.   MRN: [id number] [name] is a 82 y.o. female who presents for annual wellness visit and follow-up on chronic medical conditions.  She has the following concerns:She has a very long and complicated past medical historyCoding PVD, CAD, AS, CKD, NSTEMI,Carotid artery stenosis, bilateral renal artery stenosis. She continues to have difficulty with intermittent leg cramping. She also states that her bowel habits are now back to normal. She is scheduled for evaluation of her carotid arteries in the near future. Although she has multiple diagnoses, at the present time she is doing fairly well. He is doing all of her ADLs including driving.   Immunization History  Administered Date(s) Administered  . Influenza Split 09/24/2012  . Influenza, High Dose Seasonal PF 07/14/2013, 10/04/2015  . Pneumococcal Conjugate-13 10/04/2015  . Pneumococcal Polysaccharide-23 09/24/2012   Last Pap smear:N/A Last mammogram:N/ALast colonoscopy: Last DEXA:N/A Dentist:? Ophtho: 4/5 years Exercise:[place]  Other doctors caring for patient include: dr Ellyn Hack   Depression screen:  See questionnaire below.  Depression screen PHQ 2/9 10/04/2015  Decreased Interest 0  Down, Depressed, Hopeless 0  PHQ - 2 Score 0    Fall Risk Screen: see questionnaire below. Fall Risk  10/04/2015  Falls in the past year? No    ADL screen:  See questionnaire below Functional Status Survey: Is the patient deaf or have difficulty hearing?: No Does the patient have difficulty seeing, even when wearing glasses/contacts?: No Does the patient have difficulty walking or climbing stairs?: Yes Does the patient have difficulty dressing or bathing?: No Does the patient have difficulty doing errands alone such as visiting a doctor's office or shopping?: No   End of Life Discussion:  Patient has a living will and medical power of attorney  Review of Systems Constitutional:  -fever, -chills, -sweats, -unexpected weight change, -anorexia, -fatigue Allergy: -sneezing, -itching, -congestion  Cardiology:  -chest pain, -palpitations, -edema, -orthopnea, -paroxysmal nocturnal dyspnea Respiratory: -cough, -shortness of breath, -dyspnea on exertion, -wheezing, -hemoptysis Gastroenterology: -abdominal pain, -nausea, -vomiting, -diarrhea, -constipation, -blood in stool, -changes in bowel movement, -dysphagia Hematology: -bleeding or bruising problems Musculoskeletal: -arthralgias, -myalgias, -joint swelling, -back pain, -neck pain, -cramping, -gait changes Ophthalmology: -vision changes, -eye redness, -itching, -discharge Urology: -dysuria, -difficulty urinating, -hematuria, -urinary frequency, -urgency, incontinence Neurology: -headache, -weakness, -tingling, -numbness, -speech abnormality, -memory loss, -falls, -dizziness Psychology:  -depressed mood, -agitation, -sleep problems    PHYSICAL EXAM:  BP 114/70 mmHg  Pulse 72  Ht 5\' 3"  (1.6 m)  Wt 150 lb (68.04 kg)  BMI 26.58 kg/m2  SpO2 98%  General Appearance: Alert, cooperative, no distress, appears stated age Head: Normocephalic, without obvious abnormality, atraumatic Eyes: PERRL, conjunctiva/corneas clear, EOM's intact, fundi benign Ears: Normal TM's and external ear canals Nose: Nares normal, mucosa normal, no drainage or sinus   tenderness Throat: Lips, mucosa, and tongue normal; teeth and gums normal Neck: Supple, no lymphadenopathy; thyroid: no enlargement/tenderness/nodules; no carotid bruit or JVD  Lungs: Clear to auscultation bilaterally without wheezes, rales or ronchi; respirations unlabored Heart: Regular rate and rhythm, S1 and S2 normal, no murmur, rub or gallop  Abdomen: Soft, non-tender, nondistended, normoactive bowel sounds, no masses, no hepatosplenomegaly Rectal: Normal tone, no masses or tenderness; guaiac negative stool Extremities: No clubbing, cyanosis or edema Pulses: 2+ and  symmetric all extremities Skin: Skin color, texture, turgor normal, no rashes or lesions Lymph nodes: Cervical, supraclavicular, and axillary nodes normal Neurologic: CNII-XII intact, normal strength, sensation and gait; reflexes 2+  and symmetric throughout Psych: Normal mood, affect, hygiene and grooming. Urine microscopic showed rare RBCs. ASSESSMENT/PLAN: Aortic valve stenosis, senile calcific  Bilateral leg cramps - Plan: CBC with Differential/Platelet, Comprehensive metabolic panel  CKD (chronic kidney disease) stage 3, GFR 30-59 ml/min - Plan: CBC with Differential/Platelet, Comprehensive metabolic panel  Claudication (HCC)  Hematuria - Plan: Urinalysis Dipstick  Need for prophylactic vaccination against Streptococcus pneumoniae (pneumococcus) - Plan: Pneumococcal conjugate vaccine 13-valent  Need for prophylactic vaccination and inoculation against influenza - Plan: Flu vaccine HIGH DOSE PF (Fluzone High dose)  Dyslipidemia, goal LDL below 70 - Plan: Lipid panel  Peripheral vascular disease ([place])  Essential hypertension  Bilateral renal artery stenosis (HCC)      Discussed monthly self breast exams and yearly mammograms; at least 30 minutes of aerobic activity at least 5 days/week and weight-bearing exercise 2x/week; proper sunscreen use reviewed; healthy diet, including goals of calcium and vitamin D intake and alcohol recommendations (less than or equal to 1 drink/day) reviewed; regular seatbelt use; changing batteries in smoke detectors.  Immunization recommendations discussed.  Colonoscopy recommendations reviewed   Medicare Attestation I have personally reviewed: The patient's medical and social history Their use of alcohol, tobacco or illicit drugs Their current medications and supplements The patient's functional ability including ADLs,fall risks, home safety risks, cognitive, and hearing and visual impairment Diet and physical activities Evidence for  depression or mood disorders  The patient's weight, height, and BMI have been recorded in the chart.  I have made referrals, counseling, and provided education to the patient based on review of the above and I have provided the patient with a written personalized care plan for preventive services.   Overall this lady is doing quite well. She does continue to drive. Did give her a prescription to get TDaP at her drugstore.  Wyatt Haste, MD   10/04/2015

## 2015-10-04 NOTE — Patient Instructions (Signed)
  [name] , Thank you for taking time to come for your Medicare Wellness Visit. I appreciate your ongoing commitment to your health goals. Please review the following plan we discussed and let me know if I can assist you in the future.   These are the goals we discussed: Goals    None      This is a list of the screening recommended for you and due dates:  Health Maintenance  Topic Date Due  . Tetanus Vaccine  11/09/1944  . Shingles Vaccine  11/09/1985  . DEXA scan (bone density measurement)  11/10/1990  . Pneumonia vaccines (2 of 2 - PCV13) 09/24/2013  . Flu Shot  03/27/2015  . Colon Cancer Screening  09/11/2016

## 2015-10-05 ENCOUNTER — Encounter: Payer: Self-pay | Admitting: Cardiology

## 2015-10-05 NOTE — Assessment & Plan Note (Signed)
Most recently described as moderate aortic stenosis. Also complicated by having intracavitary gradient. Due for annual follow-up echocardiogram. No current symptoms. She is on statin and adequate blood pressure control.

## 2015-10-05 NOTE — Assessment & Plan Note (Addendum)
Stable chronic disease - known RCA stent occlusion with moderate disease in the circumflex and apical LAD.[name] No active anginal symptoms. In the absence of symptoms, I would not evaluate with Myoview - last Myoview was negative for ischemia in 2011. She is on beta blocker, aspirin and Plavix along with statin. He is also on amlodipine for antianginal effect.

## 2015-10-06 NOTE — Assessment & Plan Note (Signed)
Known disease. Stable Dopplers. No active claudication symptoms. On stable cardiac medications which helps treat PVD. She has annual Dopplers ordered, we'll discuss with her whether or not she was continued surveillance Doppler or just doing for symptoms.

## 2015-10-06 NOTE — Assessment & Plan Note (Signed)
Stable pressures. Continue current regimen. Be careful with chlorthalidone to avoid hyponatremia or hypokalemia.

## 2015-10-06 NOTE — Assessment & Plan Note (Signed)
As far until 2 documented MIs in 2016 2011. No recurrent symptoms.  Relatively normal Myoview in 2011 which was the time of her last MI. No plans for further investigation unless she has symptoms.

## 2015-10-06 NOTE — Assessment & Plan Note (Signed)
She is back on atorvastatin. She is due for lipid panel. Ordered for this week.

## 2015-10-17 ENCOUNTER — Encounter: Payer: Self-pay | Admitting: Cardiology

## 2015-10-17 ENCOUNTER — Ambulatory Visit (HOSPITAL_COMMUNITY): Payer: Medicare Other | Attending: Cardiology

## 2015-10-17 ENCOUNTER — Other Ambulatory Visit: Payer: Self-pay

## 2015-10-17 DIAGNOSIS — I1310 Hypertensive heart and chronic kidney disease without heart failure, with stage 1 through stage 4 chronic kidney disease, or unspecified chronic kidney disease: Secondary | ICD-10-CM | POA: Diagnosis not present

## 2015-10-17 DIAGNOSIS — I2510 Atherosclerotic heart disease of native coronary artery without angina pectoris: Secondary | ICD-10-CM | POA: Diagnosis not present

## 2015-10-17 DIAGNOSIS — I071 Rheumatic tricuspid insufficiency: Secondary | ICD-10-CM | POA: Diagnosis not present

## 2015-10-17 DIAGNOSIS — N183 Chronic kidney disease, stage 3 (moderate): Secondary | ICD-10-CM | POA: Insufficient documentation

## 2015-10-17 DIAGNOSIS — I739 Peripheral vascular disease, unspecified: Secondary | ICD-10-CM

## 2015-10-17 DIAGNOSIS — I352 Nonrheumatic aortic (valve) stenosis with insufficiency: Secondary | ICD-10-CM | POA: Diagnosis not present

## 2015-10-17 DIAGNOSIS — I252 Old myocardial infarction: Secondary | ICD-10-CM | POA: Diagnosis not present

## 2015-10-17 DIAGNOSIS — I6529 Occlusion and stenosis of unspecified carotid artery: Secondary | ICD-10-CM | POA: Insufficient documentation

## 2015-10-17 DIAGNOSIS — I350 Nonrheumatic aortic (valve) stenosis: Secondary | ICD-10-CM

## 2015-10-17 DIAGNOSIS — E785 Hyperlipidemia, unspecified: Secondary | ICD-10-CM

## 2015-10-17 DIAGNOSIS — I10 Essential (primary) hypertension: Secondary | ICD-10-CM

## 2015-10-17 DIAGNOSIS — I340 Nonrheumatic mitral (valve) insufficiency: Secondary | ICD-10-CM | POA: Diagnosis not present

## 2015-10-18 ENCOUNTER — Telehealth: Payer: Self-pay | Admitting: *Deleted

## 2015-10-18 NOTE — Telephone Encounter (Signed)
-----   Message from Leonie Man, MD sent at 10/17/2015 10:28 PM EST ----- Stable Echo - Mod AS.  Persistent Outflow gradient.  Only notable difference is that the PA Pressures are estimated to be higher -- may explain some dyspnea.  Leonie Man, MD

## 2015-10-18 NOTE — Telephone Encounter (Signed)
Spoke to patient. Result given . Verbalized understanding  

## 2015-10-20 ENCOUNTER — Ambulatory Visit: Payer: Medicare Other | Admitting: Family

## 2015-10-20 ENCOUNTER — Encounter (HOSPITAL_COMMUNITY): Payer: Medicare Other

## 2015-10-30 ENCOUNTER — Encounter: Payer: Self-pay | Admitting: Family

## 2015-11-03 ENCOUNTER — Encounter (HOSPITAL_COMMUNITY): Payer: Medicare Other

## 2015-11-03 ENCOUNTER — Ambulatory Visit: Payer: Medicare Other | Admitting: Family

## 2015-11-07 ENCOUNTER — Telehealth: Payer: Self-pay | Admitting: Family Medicine

## 2015-11-07 DIAGNOSIS — R194 Change in bowel habit: Secondary | ICD-10-CM

## 2015-11-07 NOTE — Telephone Encounter (Signed)
Pt called and was wanting to know if she could get another referral to the [facility] states he fixed her bowel leaking problem a couple of years ago, and she is having that problem now and wants to go back to them now to get it fixed , says she cant go anywhere for  Her bowels leaking, and she needs to get out for the summer. Pt would like for [place] to set up the appt for her to go back pt can be reached at [phone] (H) please advise pt,

## 2015-11-07 NOTE — Telephone Encounter (Signed)
Okay to refer her back

## 2015-11-07 NOTE — Telephone Encounter (Signed)
Abbie please handle 

## 2015-11-08 NOTE — Telephone Encounter (Signed)
i have put a referral in epic to [facility] for pt. They will contact pt

## 2015-11-14 ENCOUNTER — Encounter: Payer: Self-pay | Admitting: Gastroenterology

## 2015-11-22 ENCOUNTER — Other Ambulatory Visit: Payer: Self-pay | Admitting: *Deleted

## 2015-11-22 MED ORDER — AMLODIPINE BESYLATE 5 MG PO TABS
5.0000 mg | ORAL_TABLET | Freq: Every day | ORAL | Status: DC
Start: 2015-11-22 — End: 2016-01-12

## 2015-11-24 ENCOUNTER — Encounter: Payer: Self-pay | Admitting: Family

## 2015-11-24 ENCOUNTER — Other Ambulatory Visit: Payer: Self-pay | Admitting: *Deleted

## 2015-11-24 MED ORDER — CARVEDILOL 6.25 MG PO TABS: 6.2500 mg | ORAL_TABLET | Freq: Two times a day (BID) | ORAL | Status: DC

## 2015-11-27 ENCOUNTER — Telehealth: Payer: Self-pay | Admitting: Family Medicine

## 2015-11-27 MED ORDER — ATORVASTATIN CALCIUM 10 MG PO TABS: 10.0000 mg | ORAL_TABLET | Freq: Every day | ORAL | Status: DC

## 2015-11-27 NOTE — Telephone Encounter (Signed)
done

## 2015-11-27 NOTE — Telephone Encounter (Signed)
Rcvd refill requesr for Atorvastatin 10 mg #90

## 2015-11-30 ENCOUNTER — Ambulatory Visit (INDEPENDENT_AMBULATORY_CARE_PROVIDER_SITE_OTHER): Payer: Medicare Other | Admitting: Family

## 2015-11-30 ENCOUNTER — Encounter: Payer: Self-pay | Admitting: Family

## 2015-11-30 ENCOUNTER — Ambulatory Visit (HOSPITAL_COMMUNITY)
Admission: RE | Admit: 2015-11-30 | Discharge: 2015-11-30 | Disposition: A | Discharge status: Home / Self Care | Payer: Medicare Other | Source: Ambulatory Visit | Attending: Family | Admitting: Family

## 2015-11-30 VITALS — BP 135/76 | HR 72 | Temp 97.9°F | Resp 14 | Ht 63.0 in | Wt 150.3 lb

## 2015-11-30 DIAGNOSIS — I6523 Occlusion and stenosis of bilateral carotid arteries: Secondary | ICD-10-CM

## 2015-11-30 DIAGNOSIS — I6521 Occlusion and stenosis of right carotid artery: Secondary | ICD-10-CM

## 2015-11-30 DIAGNOSIS — Z8673 Personal history of transient ischemic attack (TIA), and cerebral infarction without residual deficits: Secondary | ICD-10-CM

## 2015-11-30 NOTE — Progress Notes (Signed)
Chief Complaint: Extracranial Carotid Artery Stenosis   History of Present Illness  [name] is a 82 y.o. female  patient of Dr. Kellie Simmering seen for known extracranial carotid artery stenosis, right greater than left.  She returns today for follow up.   She also has a history of PAD followed by Dr. Ellyn Hack and is s/p right femoral artery stent in 2006, 2011, and 2012. Atherectomy with PTA alone in 2006; April 2000: Right SFA - overlapping stents (6.0 mm x 170 mm x2 and 6.0 mm x 40 mm); Cutting Balloon PTA of right SFA ISR -- patent by Dopplers and cath in 2012. Bilateral renal artery stents in 2006 Dr. Deon Pilling; patent as of 2012 cath. Right iliac artery stent in 2006. Popliteal angioplasty July 04, 2011 done in the same setting as PTA of ISR in right SFA.   Pt denies LE claudication symptoms, denies non-healing wounds.   Patient has not had previous carotid artery intervention.  She states that she has a stroke in February, 2015 as manifested by right racial drooping and slurred speech that mostly resolved in a month, denies hemiparesis, denies monocular loss of vision, has had no further stroke or TIA's. She had a stroke about 2004 as manifested by left eye amaurosis fugax which lasted about 9 weeks, then resolved.   She has occasional burning and numbness in both feet.  CT of head on 09/27/13: IMPRESSION:  Evolving small right posterior frontal infarct (corresponding to  recent MRI abnormality), without acute intracranial process.  Left occipital encephalomalacia may reflect remote posterior  cerebral artery territory infarct or trauma. Moderate white matter  changes suggest chronic small vessel ischemic disease.   She has had no headaches since 2013 or 2014.  Pt Diabetic: she has metabolic syndrome, review of records shows A1C of 6.2 in January 2017  Pt smoker: non-smoker   Pt meds include:  Statin : Yes  Betablocker: Yes  ASA: Yes Other  anticoagulants/antiplatelets: Plavix    Past Medical History  Diagnosis Date  . Peripheral vascular disease ([place]) 2010    Known distal left SFA occlusion, known right posterior tibial artery occlusion as well as left posterior and anterior tibial artery occlusion.; Most recent Dopplers November 2012: Patent right SFA stent, left ABI 0.56  . Coronary artery disease - history of MI October 2006    Severe RCA lesion -- 2.5 Mm x 12 Mm Vision BMS; January 2007 -. Distal stent stenosis in RCA treated with 2.5 mm x y 12 mm Vision BMS stent distally;  circumflex/OM stenosis -- 2.5 mg 12 mm Mini Vision BMS ;  most recent cath October 11: 100% RCA occlusion with ISR. Left right collaterals. Patent to the extent, the 70% AV groove circumflex. Apical LAD stenosis 70-80 %, diffuse disease; EF 65-70%   . Aortic valve stenosis, senile calcific January 2011; Jan 2017    a. Echo: Normal EF 60-65%, mild aortic stenosis - estimated valve area 1.1 cm ; b. Feb '17: EF 65-70%, no RWMA. Gr 1 DD. Vigorous - dynamic outflow gradient (4.9 m/s). Mod AS (mean Grad 21 mmHg), Mild MS, Mod TR - PAP ~58 mmHg   . Chronic kidney disease 1964  . Hypertension     Previously on multiple medications now on only clonidine  . Non-ST elevation MI (NSTEMI) ([place]) 2006, 2011  . DVT (deep venous thrombosis) ([place])   . Aneurysm ([place])   . Anemia   . Cancer [facility]) 1963    ovarian cancer  .  Arthritis   . Bilateral renal artery stenosis ([place]) 2006 ([place])    - Right renal artery 60% stenosis March '13  . Dyslipidemia, goal LDL below 70      no longer on statin  . Cerebral aneurysm without rupture     3 mm; followed by Dr. Arnoldo Morale as well as vascular surgery  . Stroke [facility]) Feb. 2015    Carotid artery Doppler show right internal carotid 60-80% stenosis. Previously followed by Dr. Donzetta Kohut  . Carotid artery occlusion   . Fall at home Jan. 22, 2016    Social History Social History  Substance Use Topics  . Smoking status: Never Smoker   .  Smokeless tobacco: Never Used  . Alcohol Use: No     Comment: social drinker  years ago-no longer    Family History Family History  Problem Relation Age of Onset  . Irritable bowel syndrome Daughter   . Colon cancer Neg Hx   . Cancer Mother     Ovarian  . Heart attack Father     Surgical History Past Surgical History  Procedure Laterality Date  . Abdominal hysterectomy    . Bladder surgery      bladder tack  . Breast lumpectomy      left  . Appendectomy    . Femoral artery stent Right 2006, 2011, 2012    Atherectomy with PTA alone in 2006; April 2000: Right SFA - overlapping stents (6.0 mm x 170 mm x2 and 6.0 mm x 40 mm); Cutting Balloon PTA of right SFA ISR -- patent by Dopplers and cath in 2012  . Renal artery stent Bilateral 2006    Dr. Deon Pilling; patent as of 2012 cath  . Iliac artery stent Right 2006  . Popliteal artery angioplasty  July 04, 2011    Done in the same setting as PTA of ISR in right SFA  . Doppler echocardiography  09/25/2009    ef 60 to 65%,aotric valve mild stenosis 1.1cm^2(VTI)  . Nm myocar perf wall motion  10/13/2009    EF 73%  . Coronary angioplasty with stent placement  05/27/2005    RCA - MiniVision BMS 2.5 mm x 12 mm  . Coronary angioplasty with stent placement Bilateral 01/23/20072007    Circumflex-OM -- 2.5 mm x 12 mm Mini Vision BMS; RCA distal to previous stent 2.5 mm x 12 mm Mini Vision BMS  . Cardiac catheterization  05/28/2010    EF 65-70%,multi vessel CAD  . Carotid doppler  02/04/2012; January 2015    [facility]----right internal 60 to 79%,bilateral external patent,bilateral common carotid artery tortuosity present  . Renal doppler  11/13/2011    bil.renal arterial stents patent w/less than 60%,right and left kidneys equal in size  . Lower arterial extrem. doppler  07/23/2011    ABI RIGHT .92 ,LEFT ABI .12 ,RGT PROX.SFA STENT 0-49%,RGT SFA  W/O EVIDENCE OF SIGNIFICANT DIAMETER REDUCTION,RGT POPLITEAL ARTERY 50% NARROWING   .  Transthoracic echocardiogram  2/'16; 10/17/2015    a.Vigorous LV function, EF 60-70%. Dynamic outflow tract obstruction. Midcavity obliteration. Peak intracavitary gradient 36 mmHg, moderate AS, Moderate-severely Ca++ MV -> mild MS. b. EF 65-70%, no RWMA. Gr 1 DD. Vigorous - dynamic outflow gradient (4.9 m/s). Mod AS (mean Grad 21 mmHg), Mild MS, Mod TR - PAP ~58 mmHg   . Lower extremity angiogram Left 07/04/2011    Procedure: LOWER EXTREMITY ANGIOGRAM;  Surgeon: Lorretta Harp, MD;  Location: [facility] CATH LAB;  Service: Cardiovascular;  Laterality: Left;  Allergies  Allergen Reactions  . Codeine Rash    Current Outpatient Prescriptions  Medication Sig Dispense Refill  . acetaminophen (TYLENOL) 500 MG tablet Take 500 mg by mouth as needed for pain.    Marland Kitchen amLODipine (NORVASC) 5 MG tablet Take 1 tablet (5 mg total) by mouth daily. 30 tablet 1  . aspirin 81 MG EC tablet Take 1 tablet (81 mg total) by mouth daily. Swallow whole. 30 tablet 12  . atorvastatin (LIPITOR) 10 MG tablet Take 1 tablet (10 mg total) by mouth daily. 90 tablet 1  . carvedilol (COREG) 6.25 MG tablet Take 1 tablet (6.25 mg total) by mouth 2 (two) times daily with a meal. 60 tablet 0  . chlorthalidone (HYGROTON) 25 MG tablet take 1/2 tablet by mouth once daily 15 tablet 8  . clopidogrel (PLAVIX) 75 MG tablet take 1 tablet by mouth once daily 30 tablet 8  . Propylene Glycol-Glycerin (SOOTHE OP) Place 1 drop into both eyes daily.      No current facility-administered medications for this visit.    Review of Systems : See HPI for pertinent positives and negatives.  Physical Examination  Filed Vitals:   11/30/15 1218  BP: 135/76  Pulse: 72  Temp: 97.9 F (36.6 C)  TempSrc: Oral  Resp: 14  Height: 5\' 3"  (1.6 m)  Weight: 150 lb 4.8 oz (68.176 kg)  SpO2: 94%   Body mass index is 26.63 kg/(m^2).  General: WDWN female in NAD  GAIT: normal  Eyes: Pupils equal  Pulmonary: CTAB, respirations are non  labored.  Cardiac: regular rhythm, + murmur.  VASCULAR EXAM  Carotid Bruits  Left  Right    Transmitted cardiac murmur Transmitted cardiac murmur   Aorta is not palpable.  Radial pulses are 1+ palpable and equal.   LE Pulses  LEFT  RIGHT   FEMORAL 2+ palpable 1+ palpable  POPLITEAL  not palpable  not palpable   POSTERIOR TIBIAL  not palpable  not palpable   DORSALIS PEDIS  ANTERIOR TIBIAL  not palpable  faintly palpable    Gastrointestinal: soft, nontender, BS WNL, no r/g, no palpable masses, Extremities without ischemic changes. Right 4th toe tip is surgically absent. Neurologic: A&O X 3; Appropriate Affect ; SENSATION ;normal; M/S is 5/5 throughout, Speech is normal  CN 2-12 intact, Pain and light touch intact in extremities, Motor exam as listed above.               Non-Invasive Vascular Imaging CAROTID DUPLEX 11/30/2015   Right ICA: 60 - 79 % stenosis. Left ICA: 1 - 39 % stenosis. No significant change compared to exam of 10/07/14.   Assessment: [name] is a 82 y.o. female who had a stroke in 2004 and again in February of 2015; no subsequent stroke or TIA. Today's carotid duplex suggests 60 - 79 % right stenosis. Left ICA: 1 - 39 % stenosis. No significant change compared to exam of 10/07/14.  Her atherosclerotic risk factors include metabolic syndrome, CAD, extensive family hx of CVD events, and advanced age. However, she is active for her advanced age and has never used tobacco.  Dr. Ellyn Hack has been monitoring patient's PAD, she has no signs of ischemia in her LE's.  Plan: Follow-up in 6 months with Carotid Duplex scan.   I discussed in depth with the patient the nature of atherosclerosis, and emphasized the importance of maximal medical management including strict control of blood pressure, blood glucose, and lipid levels, obtaining regular exercise, and  continued cessation of smoking.  The patient is aware  that without maximal medical management the underlying atherosclerotic disease process will progress, limiting the benefit of any interventions. The patient was given information about stroke prevention and what symptoms should prompt the patient to seek immediate medical care. Thank you for allowing [place] to participate in this patient's care.  Clemon Chambers, RN, MSN, FNP-C [facility] Office: [phone]  Clinic Physician: Oneida Alar  11/30/2015 12:36 PM

## 2015-11-30 NOTE — Patient Instructions (Signed)
Stroke Prevention Some medical conditions and behaviors are associated with an increased chance of having a stroke. You may prevent a stroke by making healthy choices and managing medical conditions. HOW CAN I REDUCE MY RISK OF HAVING A STROKE?   Stay physically active. Get at least 30 minutes of activity on most or all days.  Do not smoke. It may also be helpful to avoid exposure to secondhand smoke.  Limit alcohol use. Moderate alcohol use is considered to be:  No more than 2 drinks per day for men.  No more than 1 drink per day for nonpregnant women.  Eat healthy foods. This involves:  Eating 5 or more servings of fruits and vegetables a day.  Making dietary changes that address high blood pressure (hypertension), high cholesterol, diabetes, or obesity.  Manage your cholesterol levels.  Making food choices that are high in fiber and low in saturated fat, trans fat, and cholesterol may control cholesterol levels.  Take any prescribed medicines to control cholesterol as directed by your health care provider.  Manage your diabetes.  Controlling your carbohydrate and sugar intake is recommended to manage diabetes.  Take any prescribed medicines to control diabetes as directed by your health care provider.  Control your hypertension.  Making food choices that are low in salt (sodium), saturated fat, trans fat, and cholesterol is recommended to manage hypertension.  Ask your health care provider if you need treatment to lower your blood pressure. Take any prescribed medicines to control hypertension as directed by your health care provider.  If you are 18-39 years of age, have your blood pressure checked every 3-5 years. If you are 40 years of age or older, have your blood pressure checked every year.  Maintain a healthy weight.  Reducing calorie intake and making food choices that are low in sodium, saturated fat, trans fat, and cholesterol are recommended to manage  weight.  Stop drug abuse.  Avoid taking birth control pills.  Talk to your health care provider about the risks of taking birth control pills if you are over 35 years old, smoke, get migraines, or have ever had a blood clot.  Get evaluated for sleep disorders (sleep apnea).  Talk to your health care provider about getting a sleep evaluation if you snore a lot or have excessive sleepiness.  Take medicines only as directed by your health care provider.  For some people, aspirin or blood thinners (anticoagulants) are helpful in reducing the risk of forming abnormal blood clots that can lead to stroke. If you have the irregular heart rhythm of atrial fibrillation, you should be on a blood thinner unless there is a good reason you cannot take them.  Understand all your medicine instructions.  Make sure that other conditions (such as anemia or atherosclerosis) are addressed. SEEK IMMEDIATE MEDICAL CARE IF:   You have sudden weakness or numbness of the face, arm, or leg, especially on one side of the body.  Your face or eyelid droops to one side.  You have sudden confusion.  You have trouble speaking (aphasia) or understanding.  You have sudden trouble seeing in one or both eyes.  You have sudden trouble walking.  You have dizziness.  You have a loss of balance or coordination.  You have a sudden, severe headache with no known cause.  You have new chest pain or an irregular heartbeat. Any of these symptoms may represent a serious problem that is an emergency. Do not wait to see if the symptoms will   go away. Get medical help at once. Call your local emergency services (911 in [place] Do not drive yourself to the hospital.   This information is not intended to replace advice given to you by your health care provider. Make sure you discuss any questions you have with your health care provider.   Document Released: 09/19/2004 Document Revised: 09/02/2014 Document Reviewed:  02/12/2013 Elsevier Interactive Patient Education 2016 Elsevier Inc.  

## 2015-12-15 ENCOUNTER — Encounter: Payer: Self-pay | Admitting: Gastroenterology

## 2015-12-15 ENCOUNTER — Ambulatory Visit (INDEPENDENT_AMBULATORY_CARE_PROVIDER_SITE_OTHER): Payer: Medicare Other | Admitting: Gastroenterology

## 2015-12-15 VITALS — BP 134/80 | HR 72 | Ht 62.25 in | Wt 148.1 lb

## 2015-12-15 DIAGNOSIS — R159 Full incontinence of feces: Secondary | ICD-10-CM | POA: Diagnosis not present

## 2015-12-15 NOTE — Progress Notes (Signed)
[facility] Consult Note:  History: [name] 12/15/2015  Referring physician: Wyatt Haste, MD  Reason for consult/chief complaint: Encopresis   Subjective HPI:  This patient sees me for the first time, having previously seen Dr. Deatra Ina in January 2013. She had a colonoscopy that found a few polyps. For several years, she is complained of fecal incontinence. It seems to be getting a bit more frequent, but even then only occurs once or twice a month. She believes it all started after she had a hemorrhoid surgery several years ago. Stool is formed, there is no need to strain, but at times she will just have spontaneous passage of some fecal material without realizing it. There is no rectal bleeding, she denies abdominal pain nausea early satiety or weight loss.   ROS:  Review of Systems She denies chest pain dyspnea or dysuria  Past Medical History: Past Medical History  Diagnosis Date  . Peripheral vascular disease ([place]) 2010    Known distal left SFA occlusion, known right posterior tibial artery occlusion as well as left posterior and anterior tibial artery occlusion.; Most recent Dopplers November 2012: Patent right SFA stent, left ABI 0.56  . Coronary artery disease - history of MI October 2006    Severe RCA lesion -- 2.5 Mm x 12 Mm Vision BMS; January 2007 -. Distal stent stenosis in RCA treated with 2.5 mm x y 12 mm Vision BMS stent distally;  circumflex/OM stenosis -- 2.5 mg 12 mm Mini Vision BMS ;  most recent cath October 11: 100% RCA occlusion with ISR. Left right collaterals. Patent to the extent, the 70% AV groove circumflex. Apical LAD stenosis 70-80 %, diffuse disease; EF 65-70%   . Aortic valve stenosis, senile calcific January 2011; Jan 2017    a. Echo: Normal EF 60-65%, mild aortic stenosis - estimated valve area 1.1 cm ; b. Feb '17: EF 65-70%, no RWMA. Gr 1 DD. Vigorous - dynamic outflow gradient (4.9 m/s). Mod AS (mean Grad 21 mmHg), Mild MS,  Mod TR - PAP ~58 mmHg   . Chronic kidney disease 1964  . Hypertension     Previously on multiple medications now on only clonidine  . Non-ST elevation MI (NSTEMI) ([place]) 2006, 2011  . DVT (deep venous thrombosis) ([place])   . Aneurysm ([place])   . Anemia   . Ovarian cancer ([place]) 1963  . Arthritis   . Bilateral renal artery stenosis ([place]) 2006 ([place])    - Right renal artery 60% stenosis March '13  . Dyslipidemia, goal LDL below 70      no longer on statin  . Cerebral aneurysm without rupture     3 mm; followed by Dr. Arnoldo Morale as well as vascular surgery  . Stroke [facility]) Feb. 2015    Carotid artery Doppler show right internal carotid 60-80% stenosis. Previously followed by Dr. Donzetta Kohut  . Carotid artery occlusion   . Fall at home Jan. 22, 2016     Past Surgical History: Past Surgical History  Procedure Laterality Date  . Abdominal hysterectomy    . Bladder surgery      bladder tack  . Breast lumpectomy Left   . Appendectomy    . Femoral artery stent Right 2006, 2011, 2012    Atherectomy with PTA alone in 2006; April 2000: Right SFA - overlapping stents (6.0 mm x 170 mm x2 and 6.0 mm x 40 mm); Cutting Balloon PTA of right SFA ISR -- patent by Dopplers and cath in 2012  .  Renal artery stent Bilateral 2006    Dr. Deon Pilling; patent as of 2012 cath  . Iliac artery stent Right 2006  . Popliteal artery angioplasty  July 04, 2011    Done in the same setting as PTA of ISR in right SFA  . Doppler echocardiography  09/25/2009    ef 60 to 65%,aotric valve mild stenosis 1.1cm^2(VTI)  . Nm myocar perf wall motion  10/13/2009    EF 73%  . Coronary angioplasty with stent placement  05/27/2005    RCA - MiniVision BMS 2.5 mm x 12 mm  . Coronary angioplasty with stent placement Bilateral 01/23/20072007    Circumflex-OM -- 2.5 mm x 12 mm Mini Vision BMS; RCA distal to previous stent 2.5 mm x 12 mm Mini Vision BMS  . Cardiac catheterization  05/28/2010    EF 65-70%,multi vessel CAD  . Carotid doppler   02/04/2012; January 2015    [facility] at [facility]----right internal 60 to 79%,bilateral external patent,bilateral common carotid artery tortuosity present  . Renal doppler  11/13/2011    bil.renal arterial stents patent w/less than 60%,right and left kidneys equal in size  . Lower arterial extrem. doppler  07/23/2011    ABI RIGHT .92 ,LEFT ABI .19 ,RGT PROX.SFA STENT 0-49%,RGT SFA  W/O EVIDENCE OF SIGNIFICANT DIAMETER REDUCTION,RGT POPLITEAL ARTERY 50% NARROWING   . Transthoracic echocardiogram  2/'16; 10/17/2015    a.Vigorous LV function, EF 60-70%. Dynamic outflow tract obstruction. Midcavity obliteration. Peak intracavitary gradient 36 mmHg, moderate AS, Moderate-severely Ca++ MV -> mild MS. b. EF 65-70%, no RWMA. Gr 1 DD. Vigorous - dynamic outflow gradient (4.9 m/s). Mod AS (mean Grad 21 mmHg), Mild MS, Mod TR - PAP ~58 mmHg   . Lower extremity angiogram Left 07/04/2011    Procedure: LOWER EXTREMITY ANGIOGRAM;  Surgeon: Lorretta Harp, MD;  Location: [facility] CATH LAB;  Service: Cardiovascular;  Laterality: Left;     Family History: Family History  Problem Relation Age of Onset  . Irritable bowel syndrome Daughter   . Colon cancer Neg Hx   . Ovarian cancer Mother   . Heart attack Father   . Alcoholism Brother   . Alcoholism Sister   . Alzheimer's disease Sister   . Heart attack Daughter     Social History: Social History   Social History  . Marital Status: Widowed    Spouse Name: N/A  . Number of Children: 6  . Years of Education: N/A   Social History Main Topics  . Smoking status: Never Smoker   . Smokeless tobacco: Never Used  . Alcohol Use: No     Comment: social drinker  years ago-no longer  . Drug Use: No  . Sexual Activity: No   Other Topics Concern  . None   Social History Narrative   She is a widowed mother of 46. She has 32 grandchildren and great-grandchildren   combined. She is very active. Never smoked. She has not had a drink in over 30 years.     Allergies: Allergies  Allergen Reactions  . Codeine Rash    Outpatient Meds: Current Outpatient Prescriptions  Medication Sig Dispense Refill  . acetaminophen (TYLENOL) 500 MG tablet Take 500 mg by mouth as needed for pain.    Marland Kitchen amLODipine (NORVASC) 5 MG tablet Take 1 tablet (5 mg total) by mouth daily. 30 tablet 1  . aspirin 81 MG EC tablet Take 1 tablet (81 mg total) by mouth daily. Swallow whole. 30 tablet 12  . atorvastatin (LIPITOR) 10  MG tablet Take 1 tablet (10 mg total) by mouth daily. 90 tablet 1  . carvedilol (COREG) 6.25 MG tablet Take 1 tablet (6.25 mg total) by mouth 2 (two) times daily with a meal. 60 tablet 0  . chlorthalidone (HYGROTON) 25 MG tablet take 1/2 tablet by mouth once daily 15 tablet 8  . clopidogrel (PLAVIX) 75 MG tablet take 1 tablet by mouth once daily 30 tablet 8  . Propylene Glycol-Glycerin (SOOTHE OP) Place 1 drop into both eyes 2 (two) times daily.      No current facility-administered medications for this visit.      ___________________________________________________________________ Objective  Exam:  BP 134/80 mmHg  Pulse 72  Ht 5' 2.25" (1.581 m)  Wt 148 lb 2 oz (67.189 kg)  BMI 26.88 kg/m2   General: this is a(n) Well-appearing elderly woman with good muscle mass. She appears to be in remarkably good health for her age.   Eyes: sclera anicteric, no redness  ENT: oral mucosa moist without lesions, no cervical or supraclavicular lymphadenopathy, good dentition  CV: RRR without murmur, S1/S2, no JVD, no peripheral edema  Resp: clear to auscultation bilaterally, normal RR and effort noted  GI: soft, no tenderness, with active bowel sounds. No guarding or palpable organomegaly noted.  Skin; warm and dry, no rash or jaundice noted  Neuro: awake, alert and oriented x 3. Normal gross motor function and fluent speech Rectal (chaperoned by MA): good resting sphincter tone and voluntary squeeze, formed soft stool in rectum, no  palpable lesion   Assessment: Encounter Diagnosis  Name Primary?  . Fecal incontinence Yes   There appears to be altered anal rectal sensation, perhaps due to prior surgery. There is no stricture, and her sphincter tone appears good for her age. It is possible that is mild overflow incontinence.   Plan:  Stool softener twice a week and a meal time toilet regimen She does not seem to be a good candidate for local injection therapy by cold rectal surgery, but if this continues to be a problem we may refer her to them to get their  opinion on that.  Thank you for the courtesy of this consult.  Please call me with any questions or concerns.  Nelida Meuse III

## 2015-12-15 NOTE — Patient Instructions (Addendum)
Stool softener : ducosate 100 mg capsule twice a week  Thank you for choosing [facility]  Dr Wilfrid Lund III

## 2015-12-19 ENCOUNTER — Other Ambulatory Visit: Payer: Self-pay

## 2015-12-19 MED ORDER — CARVEDILOL 6.25 MG PO TABS: 6.2500 mg | ORAL_TABLET | Freq: Two times a day (BID) | ORAL | Status: DC

## 2015-12-25 ENCOUNTER — Other Ambulatory Visit: Payer: Self-pay | Admitting: *Deleted

## 2015-12-25 DIAGNOSIS — I6523 Occlusion and stenosis of bilateral carotid arteries: Secondary | ICD-10-CM

## 2016-01-12 ENCOUNTER — Other Ambulatory Visit: Payer: Self-pay | Admitting: *Deleted

## 2016-01-12 MED ORDER — AMLODIPINE BESYLATE 5 MG PO TABS: 5.0000 mg | ORAL_TABLET | Freq: Every day | ORAL | Status: DC

## 2016-01-17 ENCOUNTER — Emergency Department (HOSPITAL_COMMUNITY)
Admission: EM | Admit: 2016-01-17 | Discharge: 2016-01-18 | Disposition: A | Discharge status: Home / Self Care | Payer: Medicare Other | Attending: Emergency Medicine | Admitting: Emergency Medicine

## 2016-01-17 ENCOUNTER — Encounter (HOSPITAL_COMMUNITY): Payer: Self-pay | Admitting: *Deleted

## 2016-01-17 DIAGNOSIS — I252 Old myocardial infarction: Secondary | ICD-10-CM | POA: Insufficient documentation

## 2016-01-17 DIAGNOSIS — T148XXA Other injury of unspecified body region, initial encounter: Secondary | ICD-10-CM

## 2016-01-17 DIAGNOSIS — N189 Chronic kidney disease, unspecified: Secondary | ICD-10-CM | POA: Diagnosis not present

## 2016-01-17 DIAGNOSIS — Y999 Unspecified external cause status: Secondary | ICD-10-CM | POA: Insufficient documentation

## 2016-01-17 DIAGNOSIS — W0110XA Fall on same level from slipping, tripping and stumbling with subsequent striking against unspecified object, initial encounter: Secondary | ICD-10-CM | POA: Insufficient documentation

## 2016-01-17 DIAGNOSIS — E785 Hyperlipidemia, unspecified: Secondary | ICD-10-CM | POA: Insufficient documentation

## 2016-01-17 DIAGNOSIS — S0093XA Contusion of unspecified part of head, initial encounter: Secondary | ICD-10-CM | POA: Diagnosis not present

## 2016-01-17 DIAGNOSIS — Y92002 Bathroom of unspecified non-institutional (private) residence single-family (private) house as the place of occurrence of the external cause: Secondary | ICD-10-CM | POA: Insufficient documentation

## 2016-01-17 DIAGNOSIS — Y939 Activity, unspecified: Secondary | ICD-10-CM | POA: Insufficient documentation

## 2016-01-17 DIAGNOSIS — Z7982 Long term (current) use of aspirin: Secondary | ICD-10-CM | POA: Insufficient documentation

## 2016-01-17 DIAGNOSIS — Z79899 Other long term (current) drug therapy: Secondary | ICD-10-CM | POA: Diagnosis not present

## 2016-01-17 DIAGNOSIS — Z8543 Personal history of malignant neoplasm of ovary: Secondary | ICD-10-CM | POA: Insufficient documentation

## 2016-01-17 DIAGNOSIS — I2510 Atherosclerotic heart disease of native coronary artery without angina pectoris: Secondary | ICD-10-CM | POA: Insufficient documentation

## 2016-01-17 DIAGNOSIS — M1990 Unspecified osteoarthritis, unspecified site: Secondary | ICD-10-CM | POA: Insufficient documentation

## 2016-01-17 DIAGNOSIS — W19XXXA Unspecified fall, initial encounter: Secondary | ICD-10-CM

## 2016-01-17 DIAGNOSIS — Z8673 Personal history of transient ischemic attack (TIA), and cerebral infarction without residual deficits: Secondary | ICD-10-CM | POA: Insufficient documentation

## 2016-01-17 DIAGNOSIS — S0990XA Unspecified injury of head, initial encounter: Secondary | ICD-10-CM | POA: Diagnosis present

## 2016-01-17 DIAGNOSIS — I129 Hypertensive chronic kidney disease with stage 1 through stage 4 chronic kidney disease, or unspecified chronic kidney disease: Secondary | ICD-10-CM | POA: Insufficient documentation

## 2016-01-17 NOTE — ED Provider Notes (Signed)
CSN: [id number]     Arrival date & time 01/17/16  2225 History  By signing my name below, I, [facility], attest that this documentation has been prepared under the direction and in the presence of Varney Biles, MD. Electronically Signed: Virgel Bouquet, ED Scribe. 01/18/2016. 1:45 AM.     Chief Complaint  Patient presents with  . Fall  . Head Injury   The history is provided by the patient and a relative.   HPI Comments: [name] is a 82 y.o. female who presents to the Emergency Department complaining of constant, mild left-sided rib pain onset earlier this evening after a fall. Pt states that she tripped over a stool in the bathroom and fell to the floor on her left side, striking her head on the wooden floor. She reports that she stayed on the floor until her granddaughter arrived to her house and helped her up and to the her bed. Granddaughter reports that the pt's ambulation was slow with a limp. She notes that she has fallen 4 times in the past 6 months. Per pt, she is checked every 6 months for an aneurysm in her head and is currently taking Plavix. Denies using a cane for ambulation normally. Denies bilateral hip pain, bilateral knee pain, HA, and any other symptoms currently.  Past Medical History  Diagnosis Date  . Peripheral vascular disease ([place]) 2010    Known distal left SFA occlusion, known right posterior tibial artery occlusion as well as left posterior and anterior tibial artery occlusion.; Most recent Dopplers November 2012: Patent right SFA stent, left ABI 0.56  . Coronary artery disease - history of MI October 2006    Severe RCA lesion -- 2.5 Mm x 12 Mm Vision BMS; January 2007 -. Distal stent stenosis in RCA treated with 2.5 mm x y 12 mm Vision BMS stent distally;  circumflex/OM stenosis -- 2.5 mg 12 mm Mini Vision BMS ;  most recent cath October 11: 100% RCA occlusion with ISR. Left right collaterals. Patent to the extent, the 70% AV groove circumflex.  Apical LAD stenosis 70-80 %, diffuse disease; EF 65-70%   . Aortic valve stenosis, senile calcific January 2011; Jan 2017    a. Echo: Normal EF 60-65%, mild aortic stenosis - estimated valve area 1.1 cm ; b. Feb '17: EF 65-70%, no RWMA. Gr 1 DD. Vigorous - dynamic outflow gradient (4.9 m/s). Mod AS (mean Grad 21 mmHg), Mild MS, Mod TR - PAP ~58 mmHg   . Chronic kidney disease 1964  . Hypertension     Previously on multiple medications now on only clonidine  . Non-ST elevation MI (NSTEMI) ([place]) 2006, 2011  . DVT (deep venous thrombosis) ([place])   . Aneurysm ([place])   . Anemia   . Ovarian cancer ([place]) 1963  . Arthritis   . Bilateral renal artery stenosis ([place]) 2006 ([place])    - Right renal artery 60% stenosis March '13  . Dyslipidemia, goal LDL below 70      no longer on statin  . Cerebral aneurysm without rupture     3 mm; followed by Dr. Arnoldo Morale as well as vascular surgery  . Stroke [facility]) Feb. 2015    Carotid artery Doppler show right internal carotid 60-80% stenosis. Previously followed by Dr. Donzetta Kohut  . Carotid artery occlusion   . Fall at home Jan. 22, 2016   Past Surgical History  Procedure Laterality Date  . Abdominal hysterectomy    . Bladder surgery  bladder tack  . Breast lumpectomy Left   . Appendectomy    . Femoral artery stent Right 2006, 2011, 2012    Atherectomy with PTA alone in 2006; April 2000: Right SFA - overlapping stents (6.0 mm x 170 mm x2 and 6.0 mm x 40 mm); [place] PTA of right SFA ISR -- patent by Dopplers and cath in 2012  . Renal artery stent Bilateral 2006    Dr. Deon Pilling; patent as of 2012 cath  . Iliac artery stent Right 2006  . Popliteal artery angioplasty  July 04, 2011    Done in the same setting as PTA of ISR in right SFA  . Doppler echocardiography  09/25/2009    ef 60 to 65%,aotric valve mild stenosis 1.1cm^2(VTI)  . Nm myocar perf wall motion  10/13/2009    EF 73%  . Coronary angioplasty with stent placement  05/27/2005    RCA -  MiniVision BMS 2.5 mm x 12 mm  . Coronary angioplasty with stent placement Bilateral 01/23/20072007    Circumflex-OM -- 2.5 mm x 12 mm Mini Vision BMS; RCA distal to previous stent 2.5 mm x 12 mm Mini Vision BMS  . Cardiac catheterization  05/28/2010    EF 65-70%,multi vessel CAD  . Carotid doppler  02/04/2012; January 2015    Done at [facility]----right internal 60 to 79%,bilateral external patent,bilateral common carotid artery tortuosity present  . Renal doppler  11/13/2011    bil.renal arterial stents patent w/less than 60%,right and left kidneys equal in size  . Lower arterial extrem. doppler  07/23/2011    ABI RIGHT .92 ,LEFT ABI .50 ,RGT PROX.SFA STENT 0-49%,RGT SFA  W/O EVIDENCE OF SIGNIFICANT DIAMETER REDUCTION,RGT POPLITEAL ARTERY 50% NARROWING   . Transthoracic echocardiogram  2/'16; 10/17/2015    a.Vigorous LV function, EF 60-70%. Dynamic outflow tract obstruction. Midcavity obliteration. Peak intracavitary gradient 36 mmHg, moderate AS, Moderate-severely Ca++ MV -> mild MS. b. EF 65-70%, no RWMA. Gr 1 DD. Vigorous - dynamic outflow gradient (4.9 m/s). Mod AS (mean Grad 21 mmHg), Mild MS, Mod TR - PAP ~58 mmHg   . Lower extremity angiogram Left 07/04/2011    Procedure: LOWER EXTREMITY ANGIOGRAM;  Surgeon: Lorretta Harp, MD;  Location[facility] C[facility];  Service: Cardiovascular;  Laterality: Left;   Family History  Problem Relation Age of Onset  . Irritable bowel syndrome Daughter   . Colon cancer Neg Hx   . Ovarian cancer Mother   . Heart attack Father   . Alcoholism Brother   . Alcoholism Sister   . Alzheimer's disease Sister   . Heart attack Daughter    Social History  Substance Use Topics  . Smoking status: Never Smoker   . Smokeless tobacco: Never Used  . Alcohol Use: No     Comment: social drinker  years ago-no longer   OB History    No data available     Review of Systems A complete 10 system review of systems was obtained and all systems are negative except as noted in  the HPI and PMH.    Allergies  Codeine  Home Medications   Prior to Admission medications   Medication Sig Start Date End Date Taking? Authorizing Provider  acetaminophen (TYLENOL) 500 MG tablet Take 500 mg by mouth every 6 (six) hours as needed for mild pain, moderate pain or headache.    Yes Historical Provider, MD  amLODipine (NORVASC) 5 MG tablet Take 1 tablet (5 mg total) by mouth daily. 01/12/16  Yes  Leonie Man, MD  aspirin 81 MG EC tablet Take 1 tablet (81 mg total) by mouth daily. Swallow whole. 09/25/13  Yes Debbe Odea, MD  atorvastatin (LIPITOR) 10 MG tablet Take 1 tablet (10 mg total) by mouth daily. 11/27/15  Yes Denita Lung, MD  carvedilol (COREG) 6.25 MG tablet Take 1 tablet (6.25 mg total) by mouth 2 (two) times daily with a meal. 12/19/15  Yes Leonie Man, MD  chlorthalidone (HYGROTON) 25 MG tablet take 1/2 tablet by mouth once daily Patient taking differently: Take 12.5 mg by mouth once daily 08/01/15  Yes Leonie Man, MD  clopidogrel (PLAVIX) 75 MG tablet take 1 tablet by mouth once daily Patient taking differently: Take 75 mg by mouth once daily 08/25/15  Yes Leonie Man, MD  Propylene Glycol-Glycerin (SOOTHE OP) Place 1 drop into both eyes 2 (two) times daily.    Yes Historical Provider, MD   BP 156/83 mmHg  Pulse 81  Temp(Src) 98.1 F (36.7 C) (Oral)  Resp 16  SpO2 91% Physical Exam  Constitutional: She is oriented to person, place, and time. She appears well-developed and well-nourished. No distress.  HENT:  Head: Normocephalic and atraumatic.  Eyes: Conjunctivae are normal.  Neck: Normal range of motion.  Cardiovascular: Normal rate and regular rhythm.   Murmur heard. Systolic murmur  Pulmonary/Chest: Effort normal. No respiratory distress.  Lungs CTA bilaterally.  Musculoskeletal: Normal range of motion.  No midline C-spine tenderness.  Neurological: She is alert and oriented to person, place, and time.  Skin: Skin is warm and dry.   Psychiatric: She has a normal mood and affect. Her behavior is normal.  Nursing note and vitals reviewed.   ED Course  Procedures (including critical care time)  DIAGNOSTIC STUDIES: Oxygen Saturation is 95% on RA, adequate by my interpretation.    COORDINATION OF CARE: 11:45 PM Will order left lateral chest x-ray and head CT. Discussed treatment plan with pt at bedside and pt agreed to plan.  Labs Review Labs Reviewed - No data to display  Imaging Review Dg Ribs Unilateral W/chest Left  01/18/2016  CLINICAL DATA:  Tripped and fell in bathroom. Hit left side of chest, with anterior left rib pain, radiating to the back. Initial encounter. EXAM: LEFT RIBS AND CHEST - 3+ VIEW COMPARISON:  Chest radiograph performed 02/18/2015 FINDINGS: No displaced rib fractures are seen. The lungs are well-aerated. Mild bibasilar opacities likely reflect atelectasis. There is no evidence of pleural effusion or pneumothorax. The cardiomediastinal silhouette is mildly enlarged. No acute osseous abnormalities are seen. IMPRESSION: Mild bibasilar opacities likely reflect atelectasis. Mild cardiomegaly. No definite displaced rib fracture seen. Electronically Signed   By: Garald Balding M.D.   On: 01/18/2016 00:21   Ct Head Wo Contrast  01/18/2016  CLINICAL DATA:  Trip and fall over stool in the bathroom. Struck head on wood floor. EXAM: CT HEAD WITHOUT CONTRAST TECHNIQUE: Contiguous axial images were obtained from the base of the skull through the vertex without intravenous contrast. COMPARISON:  09/27/2013 FINDINGS: Brain: No evidence of acute infarction, hemorrhage, extra-axial collection, ventriculomegaly, or mass effect. Encephalomalacia in the left occipital lobe is unchanged. Minimal encephalomalacia in the posterior right frontal lobe is unchanged. Age related atrophy and chronic small vessel ischemia. Vascular: No hyperdense vessel or unexpected calcification. Atherosclerosis of skullbase vasculature. Patient  reports history of intracranial aneurysm, not seen on noncontrast CT. Skull: Negative for fracture or focal lesion. Sinuses/Orbits: No acute findings. Other: None. IMPRESSION: 1.  No  acute intracranial abnormality. 2. Unchanged remote prior infarcts. Electronically Signed   By: Jeb Levering M.D.   On: 01/18/2016 01:41   I have personally reviewed and evaluated these images and lab results as part of my medical decision-making.   EKG Interpretation None      MDM   Final diagnoses:  Fall, initial encounter  Contusion    I personally performed the services described in this documentation, which was scribed in my presence. The recorded information has been reviewed and is accurate.  Pt comes in with cc of fall. DDx includes: - Mechanical falls - ICH - Fractures - Contusions - Soft tissue injury Pt has a mild headache and she c/o rib pain - we will get appropriate images to evaluate for any significant injuries- if neg, we will d/c. Pt already ambulated and did well. Cspine cleared clinically.    Varney Biles, MD 01/18/16 [phone]

## 2016-01-17 NOTE — ED Notes (Signed)
Pt states that she tripped over a stool in the bathroom this evening; pt struck left side of face / eye area; denies LOC; pt with small swelling to corner of left eye; pt also c/o pain to left breast area

## 2016-01-18 ENCOUNTER — Emergency Department (HOSPITAL_COMMUNITY): Payer: Medicare Other

## 2016-01-18 NOTE — Discharge Instructions (Signed)
We saw you in the ER after you had a fall. °All the imaging results are normal, no fractures seen. No evidence of brain bleed. °Please be very careful with walking, and do everything possible to prevent falls. ° ° °

## 2016-01-18 NOTE — ED Notes (Signed)
Pt ambulated around unit with no assistance and no issues

## 2016-01-23 ENCOUNTER — Ambulatory Visit (INDEPENDENT_AMBULATORY_CARE_PROVIDER_SITE_OTHER): Payer: Medicare Other | Admitting: Family Medicine

## 2016-01-23 ENCOUNTER — Encounter: Payer: Self-pay | Admitting: Family Medicine

## 2016-01-23 VITALS — BP 110/60 | HR 68 | Ht 63.5 in | Wt 146.0 lb

## 2016-01-23 DIAGNOSIS — E785 Hyperlipidemia, unspecified: Secondary | ICD-10-CM | POA: Diagnosis not present

## 2016-01-23 DIAGNOSIS — I6529 Occlusion and stenosis of unspecified carotid artery: Secondary | ICD-10-CM

## 2016-01-23 DIAGNOSIS — N183 Chronic kidney disease, stage 3 unspecified: Secondary | ICD-10-CM

## 2016-01-23 DIAGNOSIS — I10 Essential (primary) hypertension: Secondary | ICD-10-CM | POA: Diagnosis not present

## 2016-01-23 DIAGNOSIS — I350 Nonrheumatic aortic (valve) stenosis: Secondary | ICD-10-CM | POA: Diagnosis not present

## 2016-01-23 DIAGNOSIS — I2510 Atherosclerotic heart disease of native coronary artery without angina pectoris: Secondary | ICD-10-CM

## 2016-01-23 DIAGNOSIS — I639 Cerebral infarction, unspecified: Secondary | ICD-10-CM | POA: Diagnosis not present

## 2016-01-23 DIAGNOSIS — I739 Peripheral vascular disease, unspecified: Secondary | ICD-10-CM

## 2016-01-23 DIAGNOSIS — I252 Old myocardial infarction: Secondary | ICD-10-CM

## 2016-01-23 MED ORDER — POTASSIUM CHLORIDE CRYS ER 20 MEQ PO TBCR: 20.0000 meq | EXTENDED_RELEASE_TABLET | Freq: Every day | ORAL | Status: DC

## 2016-01-23 NOTE — Progress Notes (Signed)
Subjective:     Patient ID: [name], female   DOB: Jun 30, 1924, 82 y.o.   MRN: [id number]  HPI  [name] is a 82 yo woman here to follow up on a fall 1 week ago and for a general wellness visit.    She had a fall without LOC 1 week ago where she hit her head and left side when she tripped on a stepping stool that her granddaughter had moved. She went to ED where she had a head CT and CXR.  There were no fractures, bleeds or acute concerns.  She has been taking tylenol daily for pain.  [name] has peripheral vascular disease, CAD, aortic stenosis, and RAS.  She has had a right ilac stent, renal artery stent and cardiac stents placed post-STEMI.  She is followed by cardiology every 6 months and a vascular specialist every 6 months for these conditions and her carotid artery stenosis.  She no longer has any of the leg pain post iliac stent.  She has a history of stroke and aneurysm discovered 3 years ago.  She has not had any additional strokes since then.    She has chronic renal disease and history of hypokalemia.  She had been eating bananas everyday for this, but is interested in this time in getting her potassium in pill form if possible.    She also described left knee pain that she has been told is bursitis.  She says that this does not really bother her and when it occurs she takes tylenol.  She says this is around 3 times a month.    She is due for shingles and Tdap vaccines today, but declined immunization today stating that at her age she was not interested in these vaccines.   Review of Systems  Constitutional: Negative for fever, fatigue and unexpected weight change.  HENT: Negative for postnasal drip and sore throat.   Eyes: Negative for visual disturbance.  Respiratory: Negative for cough and shortness of breath.   Cardiovascular: Negative for palpitations.  Gastrointestinal: Negative for nausea, vomiting and diarrhea.  Endocrine: Negative for cold intolerance and  heat intolerance.  Musculoskeletal: Negative for myalgias and arthralgias.  Skin: Negative for pallor.  Neurological: Negative for weakness and light-headedness.       Objective:   Physical Exam  Alert and in no distress.  Tympanic membranes and canals are normal.  Fundoscopic exam reveals normal vessels without hemorrhage.  Pharyngeal area is normal. Neck is supple without adenopathy or thyromegaly.  Cardiac exam shows a systolic murmur best hear at the R upper sternal border and radiates to the carotids.  2+ carotid pulses appreciated. Lungs are clear to auscultation bilaterally without wheezes, rales, rhonchi. On abdominal exam, non-tender to palpation in all quadrants, normal bowel sounds, and no HSM. Some chest wall tenderness of left side.     Assessment /Plan:     Aortic valve stenosis, senile calcific  Claudication (HCC)  Peripheral vascular disease (HCC)  Essential hypertension - Plan: potassium chloride SA (K-DUR,KLOR-CON) 20 MEQ tablet  CKD (chronic kidney disease) stage 3, GFR 30-59 ml/min  Coronary artery disease involving native coronary artery of native heart without angina pectoris  Occlusion and stenosis of unspecified carotid artery  Dyslipidemia, goal LDL below 70  Non-Q wave ST elevation myocardial infarction (STEMI) involving right coronary artery  Cerebral infarction due to unspecified mechanism  CAD, history of STEMI, HTN, Dyslipidemia She will continue on current therapies and continue to see her cardiologist every  6 months.    Peripheral vasc disease, claudication She has improved symptoms post-stent, but sees her vascular specialist every 6 months.  CKD / hypokalemia Will presscribe KCl tablets given she does not want to try lo-salt, no-salt, and is tired of eating her bananas.        History and physical exam conducted by Marylen Ponto (Medical Student) in conjunction with Dr. Redmond School.

## 2016-01-23 NOTE — Progress Notes (Deleted)
Subjective:   HPI  [name] is a 82 y.o. female who presents for a complete physical.  Medical care team includes:  [facility].  [facility]  [facility]   Preventative care: Last ophthalmology visit:3 years ago Last dental visit:no teeth  Last colonoscopy:2013  Last prostate exam:  Last EKG:06/21/2015 Last labs:09/2015  Prior vaccinations: TD or Tdap:no Influenza:10/04/15 Pneumococcal: 23/08/31/13 13/ 10/04/15 Shingles/Zostavax:no Other:   Advanced directive: Health care power of attorney: Living will:  Concerns:   Reviewed their medical, surgical, family, social, medication, and allergy history and updated chart as appropriate.    Review of Systems Constitutional: -fever, -chills, -sweats, -unexpected weight change, -decreased appetite, -fatigue Allergy: -sneezing, -itching, -congestion Dermatology: -changing moles, --rash, -lumps ENT: -runny nose, -ear pain, -sore throat, -hoarseness, -sinus pain, -teeth pain, - ringing in ears, -hearing loss, -nosebleeds Cardiology: -chest pain, -palpitations, -swelling, -difficulty breathing when lying flat, -waking up short of breath Respiratory: -cough, -shortness of breath, -difficulty breathing with exercise or exertion, -wheezing, -coughing up blood Gastroenterology: -abdominal pain, -nausea, -vomiting, -diarrhea, -constipation, -blood in stool, -changes in bowel movement, -difficulty swallowing or eating Hematology: -bleeding, -bruising  Musculoskeletal: -joint aches, -muscle aches, -joint swelling, -back pain, -neck pain, -cramping, -changes in gait Ophthalmology: denies vision changes, eye redness, itching, discharge Urology: -burning with urination, -difficulty urinating, -blood in urine, -urinary frequency, -urgency, -incontinence Neurology: -headache, -weakness, -tingling, -numbness, -memory loss, -falls, -dizziness Psychology: -depressed mood, -agitation, -sleep problems     Objective:   Physical  Exam  There were no vitals filed for this visit.  General appearance: alert, no distress, WD/WN,  Skin: HEENT: normocephalic, conjunctiva/corneas normal, sclerae anicteric, PERRLA, EOMi, nares patent, no discharge or erythema, pharynx normal Oral cavity: MMM, tongue normal, teeth normal Neck: supple, no lymphadenopathy, no thyromegaly, no masses, normal ROM Chest: non tender, normal shape and expansion Heart: RRR, normal S1, S2, no murmurs Lungs: CTA bilaterally, no wheezes, rhonchi, or rales Abdomen: +bs, soft, non tender, non distended, no masses, no hepatomegaly, no splenomegaly, no bruits Back: non tender, normal ROM, no scoliosis Musculoskeletal: upper extremities non tender, no obvious deformity, normal ROM throughout, lower extremities non tender, no obvious deformity, normal ROM throughout Extremities: no edema, no cyanosis, no clubbing Pulses: 2+ symmetric, upper and lower extremities, normal cap refill Neurological: alert, oriented x 3, CN2-12 intact, strength normal upper extremities and lower extremities, sensation normal throughout, DTRs 2+ throughout, no cerebellar signs, gait normal Psychiatric: normal affect, behavior normal, pleasant  GU: normal female external genitalia, nontender, no masses, no hernia, no lymphadenopathy Rectal:    Assessment and Plan :      Physical exam - discussed healthy lifestyle, diet, exercise, preventative care, vaccinations, and addressed their concerns.        Follow-up

## 2016-04-11 ENCOUNTER — Other Ambulatory Visit: Payer: Self-pay | Admitting: Cardiology

## 2016-04-11 NOTE — Telephone Encounter (Signed)
REFILL 

## 2016-05-17 ENCOUNTER — Other Ambulatory Visit: Payer: Self-pay | Admitting: Cardiology

## 2016-05-20 NOTE — Telephone Encounter (Signed)
Rx request sent to pharmacy.  

## 2016-06-10 ENCOUNTER — Other Ambulatory Visit: Payer: Self-pay | Admitting: Family Medicine

## 2016-06-25 ENCOUNTER — Ambulatory Visit: Payer: Medicare Other | Admitting: Family

## 2016-06-25 ENCOUNTER — Encounter (HOSPITAL_COMMUNITY): Payer: Medicare Other

## 2016-06-27 ENCOUNTER — Encounter: Payer: Self-pay | Admitting: Family Medicine

## 2016-06-27 ENCOUNTER — Ambulatory Visit (INDEPENDENT_AMBULATORY_CARE_PROVIDER_SITE_OTHER): Payer: Medicare Other | Admitting: Family Medicine

## 2016-06-27 VITALS — BP 122/70 | HR 70 | Wt 143.0 lb

## 2016-06-27 DIAGNOSIS — Z23 Encounter for immunization: Secondary | ICD-10-CM | POA: Diagnosis not present

## 2016-06-27 DIAGNOSIS — I8001 Phlebitis and thrombophlebitis of superficial vessels of right lower extremity: Secondary | ICD-10-CM

## 2016-06-27 DIAGNOSIS — M25561 Pain in right knee: Secondary | ICD-10-CM

## 2016-06-27 NOTE — Progress Notes (Signed)
   Subjective:    Patient ID: [name], female    DOB: 03-25-1924, 82 y.o.   MRN: [id number]  HPI He complains of right lateral calf discomfort. She notes this especially when she is up walking around. No history of injury to that area. She also complains of difficulty with feeling as if her right knee is getting stuck when she hasn't been thin and when she shakes her leg, she is able to completely straighten it out. Again no history of injury to this area no grinding sensation or swelling.   Review of Systems     Objective:   Physical Exam Alert and in no distress. Superficial lateral calf veins are noted. She points to these is to where the pain is coming from. Exam of her right knee shows no effusion, negative anterior drawer. Medial and lateral collateral ligaments intact. McMurray's testing negative.       Assessment & Plan:  Need for prophylactic vaccination and inoculation against influenza - Plan: Flu vaccine HIGH DOSE PF (Fluzone High dose)  Superficial phlebitis of right leg  Acute pain of right knee I explained that she is having some slight swelling of the veins and use support hose to help with that. Discussed the knee pain and explained that this is most likely a small cartilage problem and as long as she is having minimal difficulty, further intervention would not be needed. She was comfortable with that. Flu shot given.

## 2016-06-27 NOTE — Patient Instructions (Signed)
He had a good pair support hose for that right leg to keep that From hurting

## 2016-07-03 ENCOUNTER — Encounter: Payer: Self-pay | Admitting: Gastroenterology

## 2016-07-19 ENCOUNTER — Other Ambulatory Visit: Payer: Self-pay | Admitting: Cardiology

## 2016-07-22 NOTE — Telephone Encounter (Signed)
REFILL 

## 2016-08-02 ENCOUNTER — Ambulatory Visit: Payer: Medicare Other | Admitting: Family

## 2016-08-02 ENCOUNTER — Encounter (HOSPITAL_COMMUNITY): Payer: Medicare Other

## 2016-08-20 ENCOUNTER — Other Ambulatory Visit: Payer: Self-pay | Admitting: Cardiology

## 2016-08-20 NOTE — Telephone Encounter (Signed)
REFILL 

## 2016-08-29 ENCOUNTER — Other Ambulatory Visit: Payer: Self-pay | Admitting: Cardiology

## 2016-08-29 DIAGNOSIS — I739 Peripheral vascular disease, unspecified: Secondary | ICD-10-CM

## 2016-09-02 ENCOUNTER — Emergency Department (HOSPITAL_COMMUNITY)
Admission: EM | Admit: 2016-09-02 | Discharge: 2016-09-02 | Disposition: A | Discharge status: Home / Self Care | Payer: Medicare Other | Attending: Emergency Medicine | Admitting: Emergency Medicine

## 2016-09-02 ENCOUNTER — Encounter (HOSPITAL_COMMUNITY): Payer: Self-pay

## 2016-09-02 ENCOUNTER — Emergency Department (HOSPITAL_COMMUNITY): Payer: Medicare Other

## 2016-09-02 DIAGNOSIS — I2510 Atherosclerotic heart disease of native coronary artery without angina pectoris: Secondary | ICD-10-CM | POA: Insufficient documentation

## 2016-09-02 DIAGNOSIS — Z955 Presence of coronary angioplasty implant and graft: Secondary | ICD-10-CM | POA: Insufficient documentation

## 2016-09-02 DIAGNOSIS — R079 Chest pain, unspecified: Secondary | ICD-10-CM | POA: Diagnosis present

## 2016-09-02 DIAGNOSIS — Z7982 Long term (current) use of aspirin: Secondary | ICD-10-CM | POA: Insufficient documentation

## 2016-09-02 DIAGNOSIS — Z8543 Personal history of malignant neoplasm of ovary: Secondary | ICD-10-CM | POA: Insufficient documentation

## 2016-09-02 DIAGNOSIS — Z79899 Other long term (current) drug therapy: Secondary | ICD-10-CM | POA: Diagnosis not present

## 2016-09-02 DIAGNOSIS — R0789 Other chest pain: Secondary | ICD-10-CM

## 2016-09-02 DIAGNOSIS — I129 Hypertensive chronic kidney disease with stage 1 through stage 4 chronic kidney disease, or unspecified chronic kidney disease: Secondary | ICD-10-CM | POA: Diagnosis not present

## 2016-09-02 DIAGNOSIS — I252 Old myocardial infarction: Secondary | ICD-10-CM | POA: Insufficient documentation

## 2016-09-02 DIAGNOSIS — N183 Chronic kidney disease, stage 3 (moderate): Secondary | ICD-10-CM | POA: Diagnosis not present

## 2016-09-02 DIAGNOSIS — Z8673 Personal history of transient ischemic attack (TIA), and cerebral infarction without residual deficits: Secondary | ICD-10-CM | POA: Insufficient documentation

## 2016-09-02 LAB — PROTIME-INR
INR: 1.04
PROTHROMBIN TIME: 13.7 s (ref 11.4–15.2)

## 2016-09-02 LAB — COMPREHENSIVE METABOLIC PANEL
ALBUMIN: 3.8 g/dL (ref 3.5–5.0)
ALT: 11 U/L — AB (ref 14–54)
AST: 17 U/L (ref 15–41)
Alkaline Phosphatase: 81 U/L (ref 38–126)
Anion gap: 9 (ref 5–15)
BUN: 19 mg/dL (ref 6–20)
CHLORIDE: 106 mmol/L (ref 101–111)
CO2: 26 mmol/L (ref 22–32)
CREATININE: 1.87 mg/dL — AB (ref 0.44–1.00)
Calcium: 10.4 mg/dL — ABNORMAL HIGH (ref 8.9–10.3)
GFR calc Af Amer: 26 mL/min — ABNORMAL LOW (ref >60)
GFR calc non Af Amer: 23 mL/min — ABNORMAL LOW (ref >60)
Glucose, Bld: 145 mg/dL — ABNORMAL HIGH (ref 65–99)
Potassium: 3.8 mmol/L (ref 3.5–5.1)
SODIUM: 141 mmol/L (ref 135–145)
Total Bilirubin: 0.6 mg/dL (ref 0.3–1.2)
Total Protein: 7.1 g/dL (ref 6.5–8.1)

## 2016-09-02 LAB — CBC
HCT: 37.1 % (ref 36.0–46.0)
Hemoglobin: 11.9 g/dL — ABNORMAL LOW (ref 12.0–15.0)
MCH: 31.9 pg (ref 26.0–34.0)
MCHC: 32.1 g/dL (ref 30.0–36.0)
MCV: 99.5 fL (ref 78.0–100.0)
PLATELETS: 166 10*3/uL (ref 150–400)
RBC: 3.73 MIL/uL — AB (ref 3.87–5.11)
RDW: 12.7 % (ref 11.5–15.5)
WBC: 5.8 10*3/uL (ref 4.0–10.5)

## 2016-09-02 LAB — TROPONIN I

## 2016-09-02 LAB — MAGNESIUM: MAGNESIUM: 2.1 mg/dL (ref 1.7–2.4)

## 2016-09-02 MED ORDER — SUCRALFATE 1 GM/10ML PO SUSP
1.0000 g | Freq: Three times a day (TID) | ORAL | Status: DC
  Administered 2016-09-02: 1 g via ORAL
  Filled 2016-09-02 (×2): qty 10

## 2016-09-02 MED ORDER — FAMOTIDINE-CA CARB-MAG HYDROX 10-800-165 MG PO CHEW: 1.0000 | CHEWABLE_TABLET | Freq: Every day | ORAL | 0 refills | Status: DC | PRN

## 2016-09-02 MED ORDER — ASPIRIN 81 MG PO CHEW: 324.0000 mg | CHEWABLE_TABLET | Freq: Once | ORAL | Status: DC

## 2016-09-02 NOTE — ED Notes (Signed)
Pt. Given beverage with EDP approval.  

## 2016-09-02 NOTE — ED Provider Notes (Signed)
[facility] DEPT Provider Note   CSN: [id number] Arrival date & time: 09/02/16  0735     History   Chief Complaint Chief Complaint  Patient presents with  . Chest Pain    HPI [name] is a 83 y.o. female.  HPI Patient presents via EMS after developing chest pain, dyspnea.  Chest pain was sternal, nonradiating, sore, earning. Symptoms began about 3 days ago.  Since onset symptoms were persistent, with transient relief with home remedy of baking soda, sugar, soda. EMS providers the patient aspirin, nitroglycerin, and all symptoms resolved entirely. Patient acknowledges multiple medical issues including hypertension.  Past Medical History:  Diagnosis Date  . Anemia   . Aneurysm ([place])   . Aortic valve stenosis, senile calcific January 2011; Jan 2017   a. Echo: Normal EF 60-65%, mild aortic stenosis - estimated valve area 1.1 cm ; b. Feb '17: EF 65-70%, no RWMA. Gr 1 DD. Vigorous - dynamic outflow gradient (4.9 m/s). Mod AS (mean Grad 21 mmHg), Mild MS, Mod TR - PAP ~58 mmHg   . Arthritis   . Bilateral renal artery stenosis ([place]) 2006 ([place])   - Right renal artery 60% stenosis March '13  . Carotid artery occlusion   . Cerebral aneurysm without rupture    3 mm; followed by Dr. Arnoldo Morale as well as vascular surgery  . Chronic kidney disease 1964  . Coronary artery disease - history of MI October 2006   Severe RCA lesion -- 2.5 Mm x 12 Mm Vision BMS; January 2007 -. Distal stent stenosis in RCA treated with 2.5 mm x y 12 mm Vision BMS stent distally;  circumflex/OM stenosis -- 2.5 mg 12 mm Mini Vision BMS ;  most recent cath October 11: 100% RCA occlusion with ISR. Left right collaterals. Patent to the extent, the 70% AV groove circumflex. Apical LAD stenosis 70-80 %, diffuse disease; EF 65-70%   . DVT (deep venous thrombosis) ([place])   . Dyslipidemia, goal LDL below 70     no longer on statin  . Fall at home Jan. 22, 2016  . Hypertension    Previously on multiple  medications now on only clonidine  . Non-ST elevation MI (NSTEMI) ([place]) 2006, 2011  . Ovarian cancer ([place]) 1963  . Peripheral vascular disease ([place]) 2010   Known distal left SFA occlusion, known right posterior tibial artery occlusion as well as left posterior and anterior tibial artery occlusion.; Most recent Dopplers November 2012: Patent right SFA stent, left ABI 0.56  . Stroke [facility]) Feb. 2015   Carotid artery Doppler show right internal carotid 60-80% stenosis. Previously followed by Dr. Donzetta Kohut    Patient Active Problem List   Diagnosis Date Noted  . Essential hypertension 09/24/2013  . CVA (cerebral infarction) 09/24/2013  . Peripheral vascular disease ([place])   . History of MI (myocardial infarction)   . Dyslipidemia, goal LDL below 70   . CKD (chronic kidney disease) stage 3, GFR 30-59 ml/min 09/23/2012  . Occlusion and stenosis of unspecified carotid artery 02/04/2012  . Benign neoplasm of colon 10/10/2011  . Aortic valve stenosis, senile calcific 08/26/2009  . Coronary artery disease involving native heart without angina pectoris 05/26/2005    Past Surgical History:  Procedure Laterality Date  . ABDOMINAL HYSTERECTOMY    . APPENDECTOMY    . BLADDER SURGERY     bladder tack  . BREAST LUMPECTOMY Left   . CARDIAC CATHETERIZATION  05/28/2010   EF 65-70%,multi vessel CAD  . CAROTID DOPPLER  02/04/2012; January 2015   Done at [facility]----right internal 60 to 79%,bilateral external patent,bilateral common carotid artery tortuosity present  . CORONARY ANGIOPLASTY WITH STENT PLACEMENT  05/27/2005   RCA - MiniVision BMS 2.5 mm x 12 mm  . CORONARY ANGIOPLASTY WITH STENT PLACEMENT Bilateral 01/23/20072007   Circumflex-OM -- 2.5 mm x 12 mm Mini Vision BMS; RCA distal to previous stent 2.5 mm x 12 mm Mini Vision BMS  . DOPPLER ECHOCARDIOGRAPHY  09/25/2009   ef 60 to 65%,aotric valve mild stenosis 1.1cm^2([facility])  . FEMORAL ARTERY STENT Right 2006, 2011, 2012   Atherectomy with PTA alone in  2006; April 2000: Right SFA - overlapping stents (6.0 mm x 170 mm x2 and 6.0 mm x 40 mm); [place] PTA of right SFA ISR -- patent by Dopplers and cath in 2012  . ILIAC ARTERY STENT Right 2006  . lower arterial extrem. doppler  07/23/2011   ABI RIGHT .92 ,LEFT ABI .14 ,RGT PROX.SFA STENT 0-49%,RGT SFA  W/O EVIDENCE OF SIGNIFICANT DIAMETER REDUCTION,RGT POPLITEAL ARTERY 50% NARROWING   . LOWER EXTREMITY ANGIOGRAM Left 07/04/2011   Procedure: LOWER EXTREMITY ANGIOGRAM;  Surgeon: Lorretta Harp, MD;  Location: [facility] CATH LAB;  Service: Cardiovascular;  Laterality: Left;  . NM MYOCAR PERF WALL MOTION  10/13/2009   EF 73%  . POPLITEAL ARTERY ANGIOPLASTY  July 04, 2011   Done in the same setting as PTA of ISR in right SFA  . RENAL ARTERY STENT Bilateral 2006   Dr. Deon Pilling; patent as of 2012 cath  . renal doppler  11/13/2011   bil.renal arterial stents patent w/less than 60%,right and left kidneys equal in size  . TRANSTHORACIC ECHOCARDIOGRAM  2/'16; 10/17/2015   a.Vigorous LV function, EF 60-70%. Dynamic outflow tract obstruction. Midcavity obliteration. Peak intracavitary gradient 36 mmHg, moderate AS, Moderate-severely Ca++ MV -> mild MS. b. EF 65-70%, no RWMA. Gr 1 DD. Vigorous - dynamic outflow gradient (4.9 m/s). Mod AS (mean Grad 21 mmHg), Mild MS, Mod TR - PAP ~58 mmHg     OB History    No data available       Home Medications    Prior to Admission medications   Medication Sig Start Date End Date Taking? Authorizing Provider  acetaminophen (TYLENOL) 500 MG tablet Take 500 mg by mouth every 6 (six) hours as needed for mild pain, moderate pain or headache.    Yes Historical Provider, MD  amLODipine (NORVASC) 5 MG tablet take 1 tablet by mouth once daily 08/20/16  Yes Leonie Man, MD  aspirin 81 MG EC tablet Take 1 tablet (81 mg total) by mouth daily. Swallow whole. 09/25/13  Yes Debbe Odea, MD  atorvastatin (LIPITOR) 10 MG tablet take 1 tablet by mouth once daily 06/10/16   Yes Denita Lung, MD  carvedilol (COREG) 6.25 MG tablet take 1 tablet by mouth twice a day with meals 07/22/16  Yes Leonie Man, MD  chlorthalidone (HYGROTON) 25 MG tablet take 1/2 tablet by mouth once daily KEEP OV 07/22/16  Yes Leonie Man, MD  clopidogrel (PLAVIX) 75 MG tablet take 1 tablet by mouth once daily 05/20/16  Yes Leonie Man, MD  Propylene Glycol-Glycerin (SOOTHE OP) Place 1 drop into both eyes 2 (two) times daily.    Yes Historical Provider, MD  potassium chloride SA (K-DUR,KLOR-CON) 20 MEQ tablet Take 1 tablet (20 mEq total) by mouth daily. Patient not taking: Reported on 09/02/2016 01/23/16   Denita Lung, MD    Family  History Family History  Problem Relation Age of Onset  . Ovarian cancer Mother   . Heart attack Father   . Irritable bowel syndrome Daughter   . Alcoholism Brother   . Alcoholism Sister   . Alzheimer's disease Sister   . Heart attack Daughter   . Colon cancer Neg Hx     Social History Social History  Substance Use Topics  . Smoking status: Never Smoker  . Smokeless tobacco: Never Used  . Alcohol use No     Comment: social drinker  years ago-no longer     Allergies   Codeine   Review of Systems Review of Systems  Constitutional:       Per HPI, otherwise negative  HENT:       Per HPI, otherwise negative  Respiratory:       Per HPI, otherwise negative  Cardiovascular:       Per HPI, otherwise negative  Gastrointestinal: Positive for nausea. Negative for vomiting.  Endocrine:       Negative aside from HPI  Genitourinary:       Neg aside from HPI   Musculoskeletal:       Per HPI, otherwise negative  Skin: Negative.   Neurological: Negative for syncope.     Physical Exam Updated Vital Signs BP 119/78 (BP Location: Right Arm)   Pulse (!) 58   Temp 97.7 F (36.5 C) (Oral)   Resp 12   Ht 5\' 3"  (1.6 m)   Wt 140 lb (63.5 kg)   SpO2 99%   BMI 24.80 kg/m   Physical Exam  Constitutional: She is oriented to person,  place, and time. She appears well-developed and well-nourished. No distress.  Elderly F awake,alert, NAD.  Denies pain  HENT:  Head: Normocephalic and atraumatic.  Eyes: Conjunctivae and EOM are normal.  Cardiovascular: Normal rate and regular rhythm.   Pulmonary/Chest: Effort normal and breath sounds normal. No stridor. No respiratory distress.  Abdominal: She exhibits no distension.  Musculoskeletal: She exhibits no edema.  Neurological: She is alert and oriented to person, place, and time. No cranial nerve deficit.  Skin: Skin is warm and dry.  Psychiatric: She has a normal mood and affect.  Nursing note and vitals reviewed.    ED Treatments / Results  Labs (all labs ordered are listed, but only abnormal results are displayed) Labs Reviewed  CBC - Abnormal; Notable for the following:       Result Value   RBC 3.73 (*)    Hemoglobin 11.9 (*)    All other components within normal limits  COMPREHENSIVE METABOLIC PANEL - Abnormal; Notable for the following:    Glucose, Bld 145 (*)    Creatinine, Ser 1.87 (*)    Calcium 10.4 (*)    ALT 11 (*)    GFR calc non Af Amer 23 (*)    GFR calc Af Amer 26 (*)    All other components within normal limits  MAGNESIUM  PROTIME-INR  TROPONIN I  TROPONIN I    EKG  EKG Interpretation  Date/Time:  Monday September 02 2016 07:48:27 EST Ventricular Rate:  72 PR Interval:    QRS Duration: 80 QT Interval:  409 QTC Calculation: 448 R Axis:   31 Text Interpretation:  Sinus rhythm Artifact T wave abnormality Abnormal ekg Confirmed by Carmin Muskrat  MD ([id number]) on 09/02/2016 8:12:35 AM       Radiology Dg Chest 2 View  Result Date: 09/02/2016 CLINICAL DATA:  Chest pain. EXAM: CHEST  2 VIEW COMPARISON:  01/18/2016.  02/18/2015. FINDINGS: Mediastinum hilar structures normal. Heart size state. Low lung volumes with mild basilar atelectasis. No pleural effusion or pneumothorax. Small hiatal hernia cannot be excluded. Degenerative changes both  shoulders. IMPRESSION: Mild bibasilar subsegmental atelectasis, otherwise negative chest . Electronically Signed   By: Marcello Moores  Register   On: 09/02/2016 08:14    Procedures Procedures (including critical care time)  Medications Ordered in ED Medications  sucralfate (CARAFATE) 1 GM/10ML suspension 1 g (1 g Oral Given 09/02/16 1101)     Initial Impression / Assessment and Plan / ED Course  I have reviewed the triage vital signs and the nursing notes.  Pertinent labs & imaging results that were available during my care of the patient were reviewed by me and considered in my medical decision making (see chart for details).  Clinical Course     11:58 AM Patient states"I feel good" She is here with a companion.  Together we reviewed all findings including 2 negative troponins, nonischemic EKG, absence of ongoing symptoms, and low suspicion for cardiac etiology given the persistency of pain for 3 days, and the aforementioned reassuring results. With suspicion for GI etiology, patient will start medication, follow-up with primary care.  Final Clinical Impressions(s) / ED Diagnoses  Atypical chest pain   Carmin Muskrat, MD 09/02/16 1158

## 2016-09-02 NOTE — ED Triage Notes (Signed)
Pt. Coming from home via [facility] for chest pain x3days. Pt. sts pain is intermittent burning feeling that goes from left chest to back. Pt. Given 324 ASA and 1 nitro en route with relief. Pt. Currently having no chest pain. Pt. Hx of MI. Pt. sts pain feels like indigestion.

## 2016-09-02 NOTE — Discharge Instructions (Signed)
As discussed, your evaluation today has been largely reassuring.  But, it is important that you monitor your condition carefully, and do not hesitate to return to the ED if you develop new, or concerning changes in your condition.  Please take all medication as directed. It seems as though your pain is likely due to irritation of the stomach and esophagus.  Return here for concerning changes.

## 2016-09-05 ENCOUNTER — Ambulatory Visit (HOSPITAL_COMMUNITY)
Admission: RE | Admit: 2016-09-05 | Discharge: 2016-09-05 | Disposition: A | Discharge status: Home / Self Care | Payer: Medicare Other | Source: Ambulatory Visit | Attending: Cardiology | Admitting: Cardiology

## 2016-09-05 DIAGNOSIS — I739 Peripheral vascular disease, unspecified: Secondary | ICD-10-CM | POA: Insufficient documentation

## 2016-09-06 ENCOUNTER — Ambulatory Visit: Payer: Medicare Other | Admitting: Family

## 2016-09-06 ENCOUNTER — Encounter (HOSPITAL_COMMUNITY): Payer: Medicare Other

## 2016-09-12 ENCOUNTER — Encounter: Payer: Self-pay | Admitting: Family

## 2016-09-19 ENCOUNTER — Ambulatory Visit: Payer: Medicare Other | Admitting: Family

## 2016-09-19 ENCOUNTER — Ambulatory Visit (HOSPITAL_COMMUNITY): Payer: Medicare Other

## 2016-09-23 ENCOUNTER — Other Ambulatory Visit: Payer: Self-pay | Admitting: Cardiology

## 2016-10-07 ENCOUNTER — Other Ambulatory Visit: Payer: Self-pay | Admitting: Cardiology

## 2016-10-24 ENCOUNTER — Ambulatory Visit: Payer: Medicare Other | Admitting: Cardiology

## 2016-11-01 ENCOUNTER — Ambulatory Visit: Payer: Medicare Other | Admitting: Cardiology

## 2016-11-05 ENCOUNTER — Ambulatory Visit: Payer: Medicare Other | Admitting: Family

## 2016-11-05 ENCOUNTER — Encounter (HOSPITAL_COMMUNITY): Payer: Medicare Other

## 2016-11-07 ENCOUNTER — Encounter (HOSPITAL_COMMUNITY): Payer: Self-pay

## 2016-11-07 ENCOUNTER — Emergency Department (HOSPITAL_COMMUNITY): Payer: No Typology Code available for payment source

## 2016-11-07 DIAGNOSIS — Z7982 Long term (current) use of aspirin: Secondary | ICD-10-CM | POA: Insufficient documentation

## 2016-11-07 DIAGNOSIS — Y92410 Unspecified street and highway as the place of occurrence of the external cause: Secondary | ICD-10-CM | POA: Insufficient documentation

## 2016-11-07 DIAGNOSIS — Y939 Activity, unspecified: Secondary | ICD-10-CM | POA: Insufficient documentation

## 2016-11-07 DIAGNOSIS — S93401A Sprain of unspecified ligament of right ankle, initial encounter: Secondary | ICD-10-CM | POA: Insufficient documentation

## 2016-11-07 DIAGNOSIS — Z79899 Other long term (current) drug therapy: Secondary | ICD-10-CM | POA: Insufficient documentation

## 2016-11-07 DIAGNOSIS — I129 Hypertensive chronic kidney disease with stage 1 through stage 4 chronic kidney disease, or unspecified chronic kidney disease: Secondary | ICD-10-CM | POA: Diagnosis not present

## 2016-11-07 DIAGNOSIS — N189 Chronic kidney disease, unspecified: Secondary | ICD-10-CM | POA: Diagnosis not present

## 2016-11-07 DIAGNOSIS — I2510 Atherosclerotic heart disease of native coronary artery without angina pectoris: Secondary | ICD-10-CM | POA: Insufficient documentation

## 2016-11-07 DIAGNOSIS — I252 Old myocardial infarction: Secondary | ICD-10-CM | POA: Insufficient documentation

## 2016-11-07 DIAGNOSIS — Z8673 Personal history of transient ischemic attack (TIA), and cerebral infarction without residual deficits: Secondary | ICD-10-CM | POA: Diagnosis not present

## 2016-11-07 DIAGNOSIS — S99911A Unspecified injury of right ankle, initial encounter: Secondary | ICD-10-CM | POA: Diagnosis present

## 2016-11-07 DIAGNOSIS — S0990XA Unspecified injury of head, initial encounter: Secondary | ICD-10-CM | POA: Diagnosis not present

## 2016-11-07 DIAGNOSIS — Y999 Unspecified external cause status: Secondary | ICD-10-CM | POA: Insufficient documentation
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
## 2016-11-08 ENCOUNTER — Encounter (HOSPITAL_COMMUNITY): Payer: Self-pay | Admitting: *Deleted

## 2016-11-08 ENCOUNTER — Emergency Department (HOSPITAL_COMMUNITY): Payer: No Typology Code available for payment source

## 2016-11-08 ENCOUNTER — Emergency Department (HOSPITAL_COMMUNITY)
Admission: EM | Admit: 2016-11-08 | Discharge: 2016-11-08 | Disposition: A | Discharge status: Home / Self Care | Payer: No Typology Code available for payment source | Attending: Emergency Medicine | Admitting: Emergency Medicine

## 2016-11-08 DIAGNOSIS — S93401A Sprain of unspecified ligament of right ankle, initial encounter: Secondary | ICD-10-CM | POA: Diagnosis not present

## 2016-11-08 DIAGNOSIS — S0990XA Unspecified injury of head, initial encounter: Secondary | ICD-10-CM

## 2016-11-08 MED ORDER — ACETAMINOPHEN 500 MG PO TABS
1000.0000 mg | ORAL_TABLET | Freq: Once | ORAL | Status: AC
  Administered 2016-11-08: 1000 mg via ORAL
  Filled 2016-11-08: qty 2
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
## 2016-11-08 NOTE — ED Notes (Signed)
ED Provider at bedside. 

## 2016-11-08 NOTE — ED Notes (Signed)
Patient transported to CT 

## 2016-11-08 NOTE — ED Notes (Signed)
Pt returned from CT °

## 2016-11-08 NOTE — Discharge Instructions (Signed)
You may take Tylenol 1000 mg every 6 hours as needed for pain. °

## 2016-11-10 ENCOUNTER — Other Ambulatory Visit: Payer: Self-pay | Admitting: Cardiology

## 2016-11-19 ENCOUNTER — Encounter: Payer: Self-pay | Admitting: Family

## 2016-11-25 ENCOUNTER — Encounter: Payer: Self-pay | Admitting: Cardiology

## 2016-11-25 ENCOUNTER — Ambulatory Visit (INDEPENDENT_AMBULATORY_CARE_PROVIDER_SITE_OTHER): Payer: Medicare Other | Admitting: Cardiology

## 2016-11-25 VITALS — BP 124/70 | HR 83 | Ht 63.0 in | Wt 147.0 lb

## 2016-11-25 DIAGNOSIS — I6522 Occlusion and stenosis of left carotid artery: Secondary | ICD-10-CM | POA: Diagnosis not present

## 2016-11-25 DIAGNOSIS — I252 Old myocardial infarction: Secondary | ICD-10-CM

## 2016-11-25 DIAGNOSIS — I350 Nonrheumatic aortic (valve) stenosis: Secondary | ICD-10-CM | POA: Diagnosis not present

## 2016-11-25 DIAGNOSIS — I739 Peripheral vascular disease, unspecified: Secondary | ICD-10-CM | POA: Diagnosis not present

## 2016-11-25 DIAGNOSIS — I10 Essential (primary) hypertension: Secondary | ICD-10-CM

## 2016-11-25 DIAGNOSIS — I25119 Atherosclerotic heart disease of native coronary artery with unspecified angina pectoris: Secondary | ICD-10-CM

## 2016-11-25 DIAGNOSIS — E785 Hyperlipidemia, unspecified: Secondary | ICD-10-CM

## 2016-11-25 NOTE — Assessment & Plan Note (Signed)
Documented MIs in 2006 as well as 2011. No further MI symptoms. Most recent Myoview back in 2011 did not show any significant scar around the time of her her last MI.

## 2016-11-25 NOTE — Patient Instructions (Signed)
NO CHANGES WITH MEDICATIONS   LABS [place] AND CMP DO NOT EAT OR DRINK THE MORNING OF TEST   Your physician wants you to follow-up in 79 MONTHS WITH DR HARDING. You will receive a reminder letter in the mail two months in advance. If you don't receive a letter, please call our office to schedule the follow-up appointment.   If you need a refill on your cardiac medications before your next appointment, please call your pharmacy.

## 2016-11-25 NOTE — Assessment & Plan Note (Signed)
She has had a couple stable echocardiograms. I think we can hold off on until next year to check another one. Not currently having any symptoms of aortic stenosis and exam does not show the murmur has worsened. Continue statin and blood pressure control.
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
## 2016-11-25 NOTE — Assessment & Plan Note (Signed)
Relatively stable pressures on current dose of Norvasc plus Coreg

## 2016-11-25 NOTE — Assessment & Plan Note (Signed)
No significant CAD with RCA occlusion and only moderate disease in the circumflex and apical LAD. No active angina symptoms. I think the chest pain episode she had to go to the hospital was probably noncardiac in the affected she has not had symptoms since. She had a Myoview back in 2011 which was negative for ischemia. Given her low record of her last episode being truly anginal equivalent, I think we can simply continue to monitor only test if her symptoms progress. She is on stable dose of beta blocker and Norvasc for antianginal affect. She is on aspirin and Plavix, partly because of her stroke. She is also on a statin, albeit low-dose.

## 2016-11-25 NOTE — Assessment & Plan Note (Signed)
This is being followed by vascular surgery along with her PAD. She had been followed by Dr. Kellie Simmering, but is now been shuffled to a new vascular surgeon .

## 2016-11-25 NOTE — Assessment & Plan Note (Signed)
She is taking low-dose atorvastatin with no complications. She has not had labs checked in a year. Plan: continue statin & check lipids/CMP

## 2016-11-28 ENCOUNTER — Other Ambulatory Visit: Payer: Self-pay | Admitting: Cardiology

## 2016-11-28 ENCOUNTER — Encounter (HOSPITAL_COMMUNITY): Payer: Medicare Other

## 2016-11-28 ENCOUNTER — Ambulatory Visit: Payer: Medicare Other | Admitting: Family

## 2016-11-28 DIAGNOSIS — I10 Essential (primary) hypertension: Secondary | ICD-10-CM | POA: Diagnosis not present

## 2016-11-28 DIAGNOSIS — E785 Hyperlipidemia, unspecified: Secondary | ICD-10-CM | POA: Diagnosis not present

## 2016-11-28 DIAGNOSIS — I2510 Atherosclerotic heart disease of native coronary artery without angina pectoris: Secondary | ICD-10-CM | POA: Diagnosis not present

## 2016-11-28 DIAGNOSIS — I739 Peripheral vascular disease, unspecified: Secondary | ICD-10-CM | POA: Diagnosis not present

## 2016-11-28 LAB — COMPREHENSIVE METABOLIC PANEL
ALK PHOS: 93 U/L (ref 33–130)
ALT: 6 U/L (ref 6–29)
AST: 14 U/L (ref 10–35)
Albumin: 4 g/dL (ref 3.6–5.1)
BUN: 22 mg/dL (ref 7–25)
CALCIUM: 10.6 mg/dL — AB (ref 8.6–10.4)
CO2: 29 mmol/L (ref 20–31)
Chloride: 103 mmol/L (ref 98–110)
Creat: 1.9 mg/dL — ABNORMAL HIGH (ref 0.60–0.88)
GLUCOSE: 112 mg/dL — AB (ref 65–99)
Potassium: 4.1 mmol/L (ref 3.5–5.3)
Sodium: 141 mmol/L (ref 135–146)
Total Bilirubin: 0.5 mg/dL (ref 0.2–1.2)
Total Protein: 6.8 g/dL (ref 6.1–8.1)

## 2016-11-28 LAB — LIPID PANEL
Cholesterol: 199 mg/dL (ref <200)
HDL: 41 mg/dL — AB (ref >50)
LDL Cholesterol: 120 mg/dL — ABNORMAL HIGH (ref <100)
Total CHOL/HDL Ratio: 4.9 Ratio (ref <5.0)
Triglycerides: 191 mg/dL — ABNORMAL HIGH (ref <150)
VLDL: 38 mg/dL — ABNORMAL HIGH (ref <30)

## 2016-12-14 ENCOUNTER — Other Ambulatory Visit: Payer: Self-pay | Admitting: Cardiology

## 2016-12-19 ENCOUNTER — Other Ambulatory Visit: Payer: Self-pay | Admitting: Family Medicine

## 2016-12-19 ENCOUNTER — Telehealth: Payer: Self-pay | Admitting: *Deleted

## 2016-12-19 MED ORDER — ATORVASTATIN CALCIUM 20 MG PO TABS: 20.0000 mg | ORAL_TABLET | Freq: Every day | ORAL | 3 refills | Status: DC

## 2016-12-19 NOTE — Telephone Encounter (Signed)
Spoke to patient. Result given . Verbalized understanding New prescription e-sent to  [id number] supply x3 refills per patient

## 2016-12-19 NOTE — Telephone Encounter (Signed)
-----   Message from Leonie Man, MD sent at 12/10/2016  2:42 PM EDT ----- Overall, pretty stable kidney function. Not great, but no real significant change. Otherwise electrolytes and LFTs are stable. Cholesterol levels are pretty stable from last year and the year before. She is not that the "gold "for her risk factors, however at her age, my understanding is that we are probably going to be less aggressive. If she would be willing, it will be reasonable thought to increase atorvastatin 20 mg daily.  Glenetta Hew, MD

## 2016-12-25 ENCOUNTER — Other Ambulatory Visit: Payer: Self-pay | Admitting: Cardiology

## 2016-12-25 NOTE — Telephone Encounter (Signed)
REFILL 

## 2017-01-09 ENCOUNTER — Encounter (HOSPITAL_COMMUNITY): Payer: Medicare Other

## 2017-01-09 ENCOUNTER — Ambulatory Visit: Payer: Medicare Other | Admitting: Family

## 2017-02-05 ENCOUNTER — Encounter: Payer: Self-pay | Admitting: Family

## 2017-02-17 ENCOUNTER — Ambulatory Visit (INDEPENDENT_AMBULATORY_CARE_PROVIDER_SITE_OTHER): Payer: Medicare Other | Admitting: Family

## 2017-02-17 ENCOUNTER — Ambulatory Visit (HOSPITAL_COMMUNITY)
Admission: RE | Admit: 2017-02-17 | Discharge: 2017-02-17 | Disposition: A | Discharge status: Home / Self Care | Payer: Medicare Other | Source: Ambulatory Visit | Attending: Surgery | Admitting: Surgery

## 2017-02-17 ENCOUNTER — Encounter: Payer: Self-pay | Admitting: Family

## 2017-02-17 VITALS — BP 133/76 | HR 71 | Temp 98.9°F | Resp 18 | Ht 63.0 in | Wt 143.0 lb

## 2017-02-17 DIAGNOSIS — Z8673 Personal history of transient ischemic attack (TIA), and cerebral infarction without residual deficits: Secondary | ICD-10-CM

## 2017-02-17 DIAGNOSIS — I6523 Occlusion and stenosis of bilateral carotid arteries: Secondary | ICD-10-CM | POA: Insufficient documentation

## 2017-02-17 DIAGNOSIS — I6521 Occlusion and stenosis of right carotid artery: Secondary | ICD-10-CM

## 2017-02-17 LAB — VAS US CAROTID
LCCAPSYS: 108 cm/s
LEFT ECA DIAS: -20 cm/s
LEFT VERTEBRAL DIAS: -27 cm/s
Left CCA prox dias: 24 cm/s
Left ICA dist dias: -19 cm/s
Left ICA dist sys: -102 cm/s
RCCAPSYS: 86 cm/s
RIGHT CCA MID DIAS: -12 cm/s
RIGHT ECA DIAS: -4 cm/s
RIGHT VERTEBRAL DIAS: -22 cm/s
Right CCA prox dias: 12 cm/s

## 2017-02-17 NOTE — Patient Instructions (Signed)
Stroke Prevention Some medical conditions and behaviors are associated with an increased chance of having a stroke. You may prevent a stroke by making healthy choices and managing medical conditions. How can I reduce my risk of having a stroke?  Stay physically active. Get at least 30 minutes of activity on most or all days.  Do not smoke. It may also be helpful to avoid exposure to secondhand smoke.  Limit alcohol use. Moderate alcohol use is considered to be:  No more than 2 drinks per day for men.  No more than 1 drink per day for nonpregnant women.  Eat healthy foods. This involves:  Eating 5 or more servings of fruits and vegetables a day.  Making dietary changes that address high blood pressure (hypertension), high cholesterol, diabetes, or obesity.  Manage your cholesterol levels.  Making food choices that are high in fiber and low in saturated fat, trans fat, and cholesterol may control cholesterol levels.  Take any prescribed medicines to control cholesterol as directed by your health care provider.  Manage your diabetes.  Controlling your carbohydrate and sugar intake is recommended to manage diabetes.  Take any prescribed medicines to control diabetes as directed by your health care provider.  Control your hypertension.  Making food choices that are low in salt (sodium), saturated fat, trans fat, and cholesterol is recommended to manage hypertension.  Ask your health care provider if you need treatment to lower your blood pressure. Take any prescribed medicines to control hypertension as directed by your health care provider.  If you are 18-39 years of age, have your blood pressure checked every 3-5 years. If you are 40 years of age or older, have your blood pressure checked every year.  Maintain a healthy weight.  Reducing calorie intake and making food choices that are low in sodium, saturated fat, trans fat, and cholesterol are recommended to manage  weight.  Stop drug abuse.  Avoid taking birth control pills.  Talk to your health care provider about the risks of taking birth control pills if you are over 35 years old, smoke, get migraines, or have ever had a blood clot.  Get evaluated for sleep disorders (sleep apnea).  Talk to your health care provider about getting a sleep evaluation if you snore a lot or have excessive sleepiness.  Take medicines only as directed by your health care provider.  For some people, aspirin or blood thinners (anticoagulants) are helpful in reducing the risk of forming abnormal blood clots that can lead to stroke. If you have the irregular heart rhythm of atrial fibrillation, you should be on a blood thinner unless there is a good reason you cannot take them.  Understand all your medicine instructions.  Make sure that other conditions (such as anemia or atherosclerosis) are addressed. Get help right away if:  You have sudden weakness or numbness of the face, arm, or leg, especially on one side of the body.  Your face or eyelid droops to one side.  You have sudden confusion.  You have trouble speaking (aphasia) or understanding.  You have sudden trouble seeing in one or both eyes.  You have sudden trouble walking.  You have dizziness.  You have a loss of balance or coordination.  You have a sudden, severe headache with no known cause.  You have new chest pain or an irregular heartbeat. Any of these symptoms may represent a serious problem that is an emergency. Do not wait to see if the symptoms will go away.   Get medical help at once. Call your local emergency services (911 in [place] Do not drive yourself to the hospital. This information is not intended to replace advice given to you by your health care provider. Make sure you discuss any questions you have with your health care provider. Document Released: 09/19/2004 Document Revised: 01/18/2016 Document Reviewed: 02/12/2013 Elsevier  Interactive Patient Education  2017 [employer]  

## 2017-02-17 NOTE — Progress Notes (Signed)
Chief Complaint: Follow up Extracranial Carotid Artery Stenosis   History of Present Illness  [name] is a 83 y.o. female patient of Dr. Kellie Simmering seen for known extracranial carotid artery stenosis, right greater than left.  She returns today for follow up.   She also has a history of PAD followed by Dr. Ellyn Hack and is s/p right femoral artery stent in 2006, 2011, and 2012. Atherectomy with PTA alone in 2006; April 2000: Right SFA - overlapping stents (6.0 mm x 170 mm x2 and 6.0 mm x 40 mm); Cutting Balloon PTA of right SFA ISR -- patent by Dopplers and cath in 2012. Bilateral renal artery stents in 2006 Dr. Deon Pilling; patent as of 2012 cath. Right iliac artery stent in 2006. Popliteal angioplasty July 04, 2011 done in the same setting as PTA of ISR in right SFA.   Pt denies LE claudication symptoms with walking, denies non-healing wounds.   Patient has not had previous carotid artery intervention.  She states that she has a stroke in February, 2015 as manifested by right racial drooping and slurred speech that mostly resolved in a month, denies hemiparesis, denies monocular loss of vision, has had no further stroke or TIA's. She had a stroke about 2004 as manifested by left eye amaurosis fugax which lasted about 9 weeks, then resolved.   She has occasional burning and numbness in both feet.  She has had no headaches since 2013 or 2014.  Pt Diabetic: she has metabolic syndrome, review of records shows A1C of 6.2 in January 2016  Pt smoker: non-smoker   Pt meds include:  Statin : Yes  Betablocker: Yes  ASA: Yes Other anticoagulants/antiplatelets: Plavix     Past Medical History:  Diagnosis Date  . Anemia   . Aortic valve stenosis, senile calcific January 2011; Jan 2017   a. Echo: Normal EF 60-65%, mild aortic stenosis - estimated valve area 1.1 cm ; b. Feb '17: EF 65-70%, no RWMA. Gr 1 DD. Vigorous - dynamic outflow gradient (4.9 m/s). Mod AS (mean Grad 21  mmHg), Mild MS, Mod TR - PAP ~58 mmHg   . Arthritis   . Bilateral renal artery stenosis ([place]) 2006 ([place])   - Right renal artery 60% stenosis March '13  . Carotid artery occlusion   . Cerebral aneurysm without rupture    3 mm; followed by Dr. Arnoldo Morale as well as vascular surgery  . Chronic kidney disease 1964  . Coronary artery disease - history of MI October 2006   Severe RCA lesion -- 2.5 Mm x 12 Mm Vision BMS; January 2007 -. Distal stent stenosis in RCA treated with 2.5 mm x y 12 mm Vision BMS stent distally;  circumflex/OM stenosis -- 2.5 mg 12 mm Mini Vision BMS ;  most recent cath October 11: 100% RCA occlusion with ISR. Left right collaterals. Patent to the extent, the 70% AV groove circumflex. Apical LAD stenosis 70-80 %, diffuse disease; EF 65-70%   . DVT (deep venous thrombosis) ([place])   . Dyslipidemia, goal LDL below 70     no longer on statin  . Fall at home Jan. 22, 2016  . Hypertension    Previously on multiple medications now on only clonidine  . Non-ST elevation MI (NSTEMI) ([place]) 2006, 2011  . Ovarian cancer ([place]) 1963  . Peripheral vascular disease ([place]) 2010   Known distal left SFA occlusion, known right posterior tibial artery occlusion as well as left posterior and anterior tibial artery occlusion.; Most recent  Dopplers November 2012: Patent right SFA stent, left ABI 0.56  . Stroke [facility]) Feb. 2015   Carotid artery Doppler show right internal carotid 60-80% stenosis. Previously followed by Dr. Donzetta Kohut    Social History Social History  Substance Use Topics  . Smoking status: Never Smoker  . Smokeless tobacco: Never Used  . Alcohol use No     Comment: social drinker  years ago-no longer    Family History Family History  Problem Relation Age of Onset  . Ovarian cancer Mother   . Heart attack Father   . Irritable bowel syndrome Daughter   . Alcoholism Brother   . Alcoholism Sister   . Alzheimer's disease Sister   . Heart attack Daughter   . Colon cancer Neg Hx      Surgical History Past Surgical History:  Procedure Laterality Date  . ABDOMINAL HYSTERECTOMY    . APPENDECTOMY    . BLADDER SURGERY     bladder tack  . BREAST LUMPECTOMY Left   . CARDIAC CATHETERIZATION  05/28/2010   EF 65-70%,multi vessel CAD  . CAROTID DOPPLER  02/04/2012; January 2015   Done at [facility]----right internal 60 to 79%,bilateral external patent,bilateral common carotid artery tortuosity present  . CORONARY ANGIOPLASTY WITH STENT PLACEMENT  05/27/2005   RCA - MiniVision BMS 2.5 mm x 12 mm  . CORONARY ANGIOPLASTY WITH STENT PLACEMENT Bilateral 01/23/20072007   Circumflex-OM -- 2.5 mm x 12 mm Mini Vision BMS; RCA distal to previous stent 2.5 mm x 12 mm Mini Vision BMS  . DOPPLER ECHOCARDIOGRAPHY  09/25/2009   ef 60 to 65%,aotric valve mild stenosis 1.1cm^2(VTI)  . FEMORAL ARTERY STENT Right 2006, 2011, 2012   Atherectomy with PTA alone in 2006; April 2000: Right SFA - overlapping stents (6.0 mm x 170 mm x2 and 6.0 mm x 40 mm); Cutting Balloon PTA of right SFA ISR -- patent by Dopplers and cath in 2012  . ILIAC ARTERY STENT Right 2006  . lower arterial extrem. doppler  07/23/2011   ABI RIGHT .92 ,LEFT ABI .53 ,RGT PROX.SFA STENT 0-49%,RGT SFA  W/O EVIDENCE OF SIGNIFICANT DIAMETER REDUCTION,RGT POPLITEAL ARTERY 50% NARROWING   . LOWER EXTREMITY ANGIOGRAM Left 07/04/2011   Procedure: LOWER EXTREMITY ANGIOGRAM;  Surgeon: Lorretta Harp, MD;  Location[facility];  Service: Cardiovascular;  Laterality: Left;  . NM MYOCAR PERF WALL MOTION  10/13/2009   EF 73%  . POPLITEAL ARTERY ANGIOPLASTY  July 04, 2011   Done in the same setting as PTA of ISR in right SFA  . RENAL ARTERY STENT Bilateral 2006   Dr. Deon Pilling; patent as of 2012 cath  . renal doppler  11/13/2011   bil.renal arterial stents patent w/less than 60%,right and left kidneys equal in size  . TRANSTHORACIC ECHOCARDIOGRAM  2/'16; 10/17/2015   a.Vigorous LV function, EF 60-70%. Dynamic outflow tract obstruction.  Midcavity obliteration. Peak intracavitary gradient 36 mmHg, moderate AS, Moderate-severely Ca++ MV -> mild MS. b. EF 65-70%, no RWMA. Gr 1 DD. Vigorous - dynamic outflow gradient (4.9 m/s). Mod AS (mean Grad 21 mmHg), Mild MS, Mod TR - PAP ~58 mmHg     Allergies  Allergen Reactions  . Codeine Rash    Current Outpatient Prescriptions  Medication Sig Dispense Refill  . acetaminophen (TYLENOL) 500 MG tablet Take 500 mg by mouth every 6 (six) hours as needed for mild pain, moderate pain or headache.     Marland Kitchen amLODipine (NORVASC) 5 MG tablet take 1 tablet by mouth once daily  30 tablet 5  . aspirin 81 MG EC tablet Take 1 tablet (81 mg total) by mouth daily. Swallow whole. 30 tablet 12  . atorvastatin (LIPITOR) 10 MG tablet take 1 tablet by mouth once daily 90 tablet 1  . atorvastatin (LIPITOR) 20 MG tablet Take 1 tablet (20 mg total) by mouth daily. 90 tablet 3  . carvedilol (COREG) 6.25 MG tablet take 1 tablet by mouth twice a day with meals 60 tablet 11  . chlorthalidone (HYGROTON) 25 MG tablet take 1/2 tablet by mouth once daily **KEEP OFFICE VISIT** 15 tablet 11  . clopidogrel (PLAVIX) 75 MG tablet take 1 tablet by mouth once daily 30 tablet 11  . famotidine-calcium carbonate-magnesium hydroxide (PEPCID COMPLETE) 10-800-165 MG chewable tablet Chew 1 tablet by mouth daily as needed. 30 tablet 0  . OVER THE COUNTER MEDICATION Take 1 capsule by mouth daily as needed (constipation). STOOL SOFTNER    . potassium chloride SA (K-DUR,KLOR-CON) 20 MEQ tablet Take 1 tablet (20 mEq total) by mouth daily. 90 tablet 3  . Propylene Glycol-Glycerin (SOOTHE OP) Place 1 drop into both eyes 2 (two) times daily.      No current facility-administered medications for this visit.     Review of Systems : See HPI for pertinent positives and negatives.  Physical Examination  Vitals:   02/17/17 1538 02/17/17 1543  BP: (!) 147/74 133/76  Pulse: 71   Resp: 18   Temp: 98.9 F (37.2 C)   TempSrc: Oral   SpO2:  96%   Weight: 143 lb (64.9 kg)   Height: 5\' 3"  (1.6 m)    Body mass index is 25.33 kg/m.  General: WDWN female in NAD  GAIT: normal  Eyes: Pupils equal  Pulmonary: CTAB, respirations are non labored.  Cardiac: regular rhythm, + murmur.  VASCULAR EXAM  Carotid Bruits  Left  Right    Transmitted cardiac murmur Transmitted cardiac murmur   Aorta is not palpable.  Radial pulses are 1+ palpable and equal.   LE Pulses  LEFT  RIGHT   FEMORAL 2+ palpable 1+ palpable  POPLITEAL  not palpable  not palpable   POSTERIOR TIBIAL  not palpable  not palpable   DORSALIS PEDIS  ANTERIOR TIBIAL  not palpable  faintly palpable    Gastrointestinal: soft, nontender, BS WNL, no r/g, no palpable masses, Extremities without ischemic changes. Right 4th toe tip is surgically absent. Neurologic: A&O X 3; Appropriate Affect ; SENSATION ;normal; M/S is 5/5 throughout, speech is normal, CN 2-12 intact, Pain and light touch intact in extremities, Motor exam as listed above.     Assessment: [name] is a 83 y.o. female who had a stroke in 2004 and again in February of 2015; no subsequent stroke or TIA.  Her atherosclerotic risk factors include metabolic syndrome, CAD, extensive family hx of CVD events, and advanced age. However, she is active for her advanced age and has never used tobacco.  Dr. Ellyn Hack has been monitoring patient's PAD, she has no signs of ischemia in her LE's.  DATA Carotid duplex (02/17/17): 60 - 79 % right stenosis. Left ICA: 1 - 39 % stenosis. Bilateral vertebral artery flow is antegrade.  Bilateral subclavian artery waveforms are normal.  Increased PSV and EDV since the last exam on 11-30-15, but category of stenosis remains 60-79%.   CT of head on 09/27/13: IMPRESSION:  Evolving small right posterior frontal infarct (corresponding to  recent MRI abnormality), without acute intracranial process.  Left occipital  encephalomalacia  may reflect remote posterior  cerebral artery territory infarct or trauma. Moderate white matter  changes suggest chronic small vessel ischemic disease.   Plan: Follow-up in 6 months with Carotid Duplex scan.   I discussed in depth with the patient the nature of atherosclerosis, and emphasized the importance of maximal medical management including strict control of blood pressure, blood glucose, and lipid levels, obtaining regular exercise, and continued cessation of smoking.  The patient is aware that without maximal medical management the underlying atherosclerotic disease process will progress, limiting the benefit of any interventions. The patient was given information about stroke prevention and what symptoms should prompt the patient to seek immediate medical care. Thank you for allowing [place] to participate in this patient's care.  Clemon Chambers, RN, MSN, FNP-C [facility] Office: [facility] Physician: Trula Slade  02/17/17 3:50 PM

## 2017-03-14 ENCOUNTER — Ambulatory Visit (INDEPENDENT_AMBULATORY_CARE_PROVIDER_SITE_OTHER): Payer: Medicare Other | Admitting: Family Medicine

## 2017-03-14 ENCOUNTER — Encounter: Payer: Self-pay | Admitting: Family Medicine

## 2017-03-14 VITALS — BP 120/80 | HR 77 | Wt 142.0 lb

## 2017-03-14 DIAGNOSIS — M79674 Pain in right toe(s): Secondary | ICD-10-CM

## 2017-03-14 DIAGNOSIS — G8929 Other chronic pain: Secondary | ICD-10-CM | POA: Diagnosis not present

## 2017-03-14 DIAGNOSIS — R300 Dysuria: Secondary | ICD-10-CM

## 2017-03-14 DIAGNOSIS — M545 Low back pain: Secondary | ICD-10-CM

## 2017-03-14 LAB — POCT URINALYSIS DIP (PROADVANTAGE DEVICE)
Bilirubin, UA: NEGATIVE
GLUCOSE UA: NEGATIVE mg/dL
Ketones, POC UA: NEGATIVE mg/dL
LEUKOCYTES UA: NEGATIVE
Nitrite, UA: NEGATIVE
SPECIFIC GRAVITY, URINE: 1.03
Urobilinogen, Ur: NEGATIVE
pH, UA: 6 (ref 5.0–8.0)

## 2017-03-14 NOTE — Progress Notes (Signed)
   Subjective:    Patient ID: [name], female    DOB: 12-11-1923, 83 y.o.   MRN: [id number]  HPI She is here for multiple issues. She notes difficulty with sacral area discomfort when she rides for long period of time and then hits a bump. No numbness, tingling, referred pain. She has remote history of injury to that area but apparently never fractured anything. She also complains of intermittent dysuria but no fever, chills, abdominal pain. She also has noted difficulty with right third toenail discomfort over the last month.   Review of Systems     Objective:   Physical Exam Alert and in no distress. No tenderness palpation over the lumbar or sacral or pelvic bones. Good motion of the hips. Exam of the right great toe does show some thickening of the skin to the lateral aspect of the nail but no erythema or drainage. Urinalysis did show red cells over microscopic was negative.       Assessment & Plan:  Toe pain, chronic, right - Plan: Ambulatory referral to Podiatry  Dysuria - Plan: POCT Urinalysis DIP (Proadvantage Device), CANCELED: Urinalysis Dipstick  Chronic bilateral low back pain without sciatica No therapy for the dysuria at this time. She will return if further trouble. Also recommend proper posturing for her is a think that is the main issue for her having discomfort.

## 2017-03-24 ENCOUNTER — Other Ambulatory Visit: Payer: Self-pay | Admitting: Family Medicine

## 2017-03-24 DIAGNOSIS — I10 Essential (primary) hypertension: Secondary | ICD-10-CM

## 2017-04-03 ENCOUNTER — Ambulatory Visit: Payer: Medicare Other | Admitting: Podiatry

## 2017-04-17 ENCOUNTER — Ambulatory Visit (INDEPENDENT_AMBULATORY_CARE_PROVIDER_SITE_OTHER): Payer: Medicare Other | Admitting: Podiatry

## 2017-04-17 ENCOUNTER — Ambulatory Visit: Payer: Medicare Other | Admitting: Podiatry

## 2017-04-17 ENCOUNTER — Encounter: Payer: Self-pay | Admitting: Podiatry

## 2017-04-17 VITALS — BP 121/77 | HR 72 | Resp 16

## 2017-04-17 DIAGNOSIS — E1151 Type 2 diabetes mellitus with diabetic peripheral angiopathy without gangrene: Secondary | ICD-10-CM

## 2017-04-17 DIAGNOSIS — Z89421 Acquired absence of other right toe(s): Secondary | ICD-10-CM | POA: Diagnosis not present

## 2017-04-17 NOTE — Progress Notes (Signed)
Subjective:  Patient ID: [name], female    DOB: March 30, 1924,  MRN: [id number] HPI Chief Complaint  Patient presents with  . Toe Pain    4th toe right (lateral border) - patient states granddaughter was trimming toenails and cut the skin on that toe, PCP advised referral to [place] for eval    83 y.o. female presents with the above complaint. Reports hx of amputation of the tip of her 3rd toe several years ago by Dr. Milinda Pointer. Reports elongated nails that she can not care for.  Past Medical History:  Diagnosis Date  . Anemia   . Aortic valve stenosis, senile calcific January 2011; Jan 2017   a. Echo: Normal EF 60-65%, mild aortic stenosis - estimated valve area 1.1 cm ; b. Feb '17: EF 65-70%, no RWMA. Gr 1 DD. Vigorous - dynamic outflow gradient (4.9 m/s). Mod AS (mean Grad 21 mmHg), Mild MS, Mod TR - PAP ~58 mmHg   . Arthritis   . Bilateral renal artery stenosis ([place]) 2006 ([place])   - Right renal artery 60% stenosis March '13  . Carotid artery occlusion   . Cerebral aneurysm without rupture    3 mm; followed by Dr. Arnoldo Morale as well as vascular surgery  . Chronic kidney disease 1964  . Coronary artery disease - history of MI October 2006   Severe RCA lesion -- 2.5 Mm x 12 Mm Vision BMS; January 2007 -. Distal stent stenosis in RCA treated with 2.5 mm x y 12 mm Vision BMS stent distally;  circumflex/OM stenosis -- 2.5 mg 12 mm Mini Vision BMS ;  most recent cath October 11: 100% RCA occlusion with ISR. Left right collaterals. Patent to the extent, the 70% AV groove circumflex. Apical LAD stenosis 70-80 %, diffuse disease; EF 65-70%   . DVT (deep venous thrombosis) ([place])   . Dyslipidemia, goal LDL below 70     no longer on statin  . Fall at home Jan. 22, 2016  . Hypertension    Previously on multiple medications now on only clonidine  . Non-ST elevation MI (NSTEMI) ([place]) 2006, 2011  . Ovarian cancer ([place]) 1963  . Peripheral vascular disease ([place]) 2010   Known distal left SFA  occlusion, known right posterior tibial artery occlusion as well as left posterior and anterior tibial artery occlusion.; Most recent Dopplers November 2012: Patent right SFA stent, left ABI 0.56  . Stroke [facility]) Feb. 2015   Carotid artery Doppler show right internal carotid 60-80% stenosis. Previously followed by Dr. Donzetta Kohut   Past Surgical History:  Procedure Laterality Date  . ABDOMINAL HYSTERECTOMY    . APPENDECTOMY    . BLADDER SURGERY     bladder tack  . BREAST LUMPECTOMY Left   . CARDIAC CATHETERIZATION  05/28/2010   EF 65-70%,multi vessel CAD  . CAROTID DOPPLER  02/04/2012; January 2015   [facility] at [facility]----right internal 60 to 79%,bilateral external patent,bilateral common carotid artery tortuosity present  . CORONARY ANGIOPLASTY WITH STENT PLACEMENT  05/27/2005   RCA - MiniVision BMS 2.5 mm x 12 mm  . CORONARY ANGIOPLASTY WITH STENT PLACEMENT Bilateral 01/23/20072007   Circumflex-OM -- 2.5 mm x 12 mm Mini Vision BMS; RCA distal to previous stent 2.5 mm x 12 mm Mini Vision BMS  . DOPPLER ECHOCARDIOGRAPHY  09/25/2009   ef 60 to 65%,aotric valve mild stenosis 1.1cm^2(VTI)  . FEMORAL ARTERY STENT Right 2006, 2011, 2012   Atherectomy with PTA alone in 2006; April 2000: Right SFA - overlapping stents (6.0  mm x 170 mm x2 and 6.0 mm x 40 mm); Cutting Balloon PTA of right SFA ISR -- patent by Dopplers and cath in 2012  . ILIAC ARTERY STENT Right 2006  . lower arterial extrem. doppler  07/23/2011   ABI RIGHT .92 ,LEFT ABI .45 ,RGT PROX.SFA STENT 0-49%,RGT SFA  W/O EVIDENCE OF SIGNIFICANT DIAMETER REDUCTION,RGT POPLITEAL ARTERY 50% NARROWING   . LOWER EXTREMITY ANGIOGRAM Left 07/04/2011   Procedure: LOWER EXTREMITY ANGIOGRAM;  Surgeon: Lorretta Harp, MD;  Location: [facility] CATH LAB;  Service: Cardiovascular;  Laterality: Left;  . NM MYOCAR PERF WALL MOTION  10/13/2009   EF 73%  . POPLITEAL ARTERY ANGIOPLASTY  July 04, 2011   Done in the same setting as PTA of ISR in right SFA  . RENAL ARTERY  STENT Bilateral 2006   Dr. Deon Pilling; patent as of 2012 cath  . renal doppler  11/13/2011   bil.renal arterial stents patent w/less than 60%,right and left kidneys equal in size  . TRANSTHORACIC ECHOCARDIOGRAM  2/'16; 10/17/2015   a.Vigorous LV function, EF 60-70%. Dynamic outflow tract obstruction. Midcavity obliteration. Peak intracavitary gradient 36 mmHg, moderate AS, Moderate-severely Ca++ MV -> mild MS. b. EF 65-70%, no RWMA. Gr 1 DD. Vigorous - dynamic outflow gradient (4.9 m/s). Mod AS (mean Grad 21 mmHg), Mild MS, Mod TR - PAP ~58 mmHg     Current Outpatient Prescriptions:  .  acetaminophen (TYLENOL) 500 MG tablet, Take 500 mg by mouth every 6 (six) hours as needed for mild pain, moderate pain or headache. , Disp: , Rfl:  .  amLODipine (NORVASC) 5 MG tablet, take 1 tablet by mouth once daily, Disp: 30 tablet, Rfl: 5 .  aspirin 81 MG EC tablet, Take 1 tablet (81 mg total) by mouth daily. Swallow whole., Disp: 30 tablet, Rfl: 12 .  atorvastatin (LIPITOR) 10 MG tablet, take 1 tablet by mouth once daily, Disp: 90 tablet, Rfl: 1 .  atorvastatin (LIPITOR) 20 MG tablet, Take 1 tablet (20 mg total) by mouth daily., Disp: 90 tablet, Rfl: 3 .  carvedilol (COREG) 6.25 MG tablet, take 1 tablet by mouth twice a day with meals, Disp: 60 tablet, Rfl: 11 .  chlorthalidone (HYGROTON) 25 MG tablet, take 1/2 tablet by mouth once daily **KEEP OFFICE VISIT**, Disp: 15 tablet, Rfl: 11 .  clopidogrel (PLAVIX) 75 MG tablet, take 1 tablet by mouth once daily, Disp: 30 tablet, Rfl: 11 .  famotidine-calcium carbonate-magnesium hydroxide (PEPCID COMPLETE) 10-800-165 MG chewable tablet, Chew 1 tablet by mouth daily as needed., Disp: 30 tablet, Rfl: 0 .  OVER THE COUNTER MEDICATION, Take 1 capsule by mouth daily as needed (constipation). STOOL SOFTNER, Disp: , Rfl:  .  potassium chloride SA (K-DUR,KLOR-CON) 20 MEQ tablet, take 1 tablet by mouth once daily, Disp: 90 tablet, Rfl: 3 .  Propylene Glycol-Glycerin (SOOTHE  OP), Place 1 drop into both eyes 2 (two) times daily. , Disp: , Rfl:   Allergies  Allergen Reactions  . Codeine Rash   Review of Systems  Musculoskeletal: Positive for arthralgias.  All other systems reviewed and are negative.  Objective:   Vitals:   04/17/17 1442  BP: 121/77  Pulse: 72  Resp: 16   Vitals:   04/17/17 1442  BP: 121/77  Pulse: 72  Resp: 16   General AA&O x3. Normal mood and affect.  Vascular Dorsalis pedis and posterior tibial pulses  present 1+ bilaterally  Capillary refill normal to all digits. Pedal hair growth diminished.  Neurologic Epicritic sensation  present bilaterally.  Dermatologic No open lesions. Interspaces clear of maceration.  Normal skin temperature and turgor. Hyperkeratotic lesions: dorsal 2nd PIPJ bilat, R submet 3 Nails: discoloration dark brown, onychomycosis, thickening  Orthopedic: Amputation hx R distal 3rd toe. MMT 5/5 in dorsiflexion, plantarflexion, inversion, and eversion. Normal lower extremity joint ROM without pain or crepitus.   Assessment & Plan:  Patient was evaluated and treated and all questions answered.  DM with Onychomycosis; Amputation Hx -Nails debrided sharply and manually with nail nipper and rotary burr. Patient meets Class A findings. -Calluses x 3 debrided with 15 blade. -DM Risk Level 3 (History of amputation)  Follow up in 3 months for routine foot care.

## 2017-04-30 ENCOUNTER — Ambulatory Visit (INDEPENDENT_AMBULATORY_CARE_PROVIDER_SITE_OTHER): Payer: Medicare Other | Admitting: Family Medicine

## 2017-04-30 ENCOUNTER — Encounter: Payer: Self-pay | Admitting: Family Medicine

## 2017-04-30 VITALS — BP 130/80 | HR 70 | Resp 16 | Wt 143.4 lb

## 2017-04-30 DIAGNOSIS — R519 Headache, unspecified: Secondary | ICD-10-CM

## 2017-04-30 DIAGNOSIS — R300 Dysuria: Secondary | ICD-10-CM

## 2017-04-30 DIAGNOSIS — Z23 Encounter for immunization: Secondary | ICD-10-CM

## 2017-04-30 DIAGNOSIS — I350 Nonrheumatic aortic (valve) stenosis: Secondary | ICD-10-CM | POA: Diagnosis not present

## 2017-04-30 DIAGNOSIS — R51 Headache: Secondary | ICD-10-CM

## 2017-04-30 LAB — POCT URINALYSIS DIP (PROADVANTAGE DEVICE)
Bilirubin, UA: NEGATIVE
Glucose, UA: NEGATIVE mg/dL
Ketones, POC UA: NEGATIVE mg/dL
Leukocytes, UA: NEGATIVE
Nitrite, UA: NEGATIVE
Specific Gravity, Urine: 1.03
UUROB: NEGATIVE
pH, UA: 6 (ref 5.0–8.0)

## 2017-04-30 NOTE — Progress Notes (Signed)
   Subjective:    Patient ID: [name], female    DOB: Jan 16, 1924, 83 y.o.   MRN: [id number]  HPI She is here for evaluation of several issues. She notes intermittent difficulty with dysuria but no frequency or urgency. She also has had some difficulty with right-sided headache after she hit her head while getting into a car. Apparently this occurred on 2 separate occasions. She did not lose her balance during that timeframe. No blurred vision, double vision, weakness, fever, chills. She also complains of some slight cheek discomfort and earache.   Review of Systems     Objective:   Physical Exam Alert and in no distress. Tympanic membranes and canals are normal. Pharyngeal area is normal. Neck is supple without adenopathy or thyromegaly. Cardiac exam shows a regular sinus rhythm with a 3/6 SEM, no gallops. Lungs are clear to auscultation.  Urine microscopic was negative.      Assessment & Plan:  Dysuria - Plan: POCT Urinalysis DIP (Proadvantage Device)  Need for influenza vaccination  Nonintractable headache, unspecified chronicity pattern, unspecified headache type  Aortic valve stenosis, senile calcific I explained that she did not have a bladder infection. The headache symptoms are mainly tender to palpation on the scalp and I explained that I thought this would go away reasonably quickly. She was comfortable with that.

## 2017-05-06 ENCOUNTER — Ambulatory Visit: Payer: Medicare Other | Admitting: Podiatry

## 2017-05-14 ENCOUNTER — Telehealth: Payer: Self-pay | Admitting: Family Medicine

## 2017-05-14 NOTE — Telephone Encounter (Signed)
Have her go to the grocery store and get a medicine called no salt which should be in the salt I'll. That's essentially the same thing and take a teaspoon of that.

## 2017-05-14 NOTE — Telephone Encounter (Signed)
Pt states she is not able to swallow the no salt, she questions if she can smash the postassium pills up to take them. [place] December

## 2017-05-14 NOTE — Telephone Encounter (Signed)
Yes she can smash them up

## 2017-05-14 NOTE — Telephone Encounter (Signed)
Pt called and states the Potassium is too big for her to swallow even when she breaks them in half. Is there something smaller you could call in.   Pt pharm [facility] [address] Pt ph [phone]

## 2017-05-14 NOTE — Telephone Encounter (Signed)
Pt made aware of recommendations /RLB  

## 2017-05-29 ENCOUNTER — Ambulatory Visit (INDEPENDENT_AMBULATORY_CARE_PROVIDER_SITE_OTHER): Payer: Medicare Other | Admitting: Family Medicine

## 2017-05-29 ENCOUNTER — Encounter: Payer: Self-pay | Admitting: Family Medicine

## 2017-05-29 VITALS — BP 132/74 | HR 81 | Temp 98.0°F | Wt 145.8 lb

## 2017-05-29 DIAGNOSIS — R300 Dysuria: Secondary | ICD-10-CM | POA: Diagnosis not present

## 2017-05-29 LAB — POCT URINALYSIS DIP (PROADVANTAGE DEVICE)
BILIRUBIN UA: NEGATIVE
Glucose, UA: NEGATIVE mg/dL
Ketones, POC UA: NEGATIVE mg/dL
Leukocytes, UA: NEGATIVE
NITRITE UA: NEGATIVE
PH UA: 6 (ref 5.0–8.0)
Protein Ur, POC: NEGATIVE mg/dL
Specific Gravity, Urine: 1.03
UUROB: NEGATIVE

## 2017-05-29 MED ORDER — SULFAMETHOXAZOLE-TRIMETHOPRIM 800-160 MG PO TABS: 1.0000 | ORAL_TABLET | Freq: Two times a day (BID) | ORAL | 0 refills | Status: DC

## 2017-05-29 NOTE — Progress Notes (Signed)
   Subjective:    Patient ID: [name], female    DOB: [birth year], 83 y.o.   MRN: [id number]  HPI She complains of a several day history of burning with urination but no frequency, abdominal pain, fever, chills, urgency. She has noted no vaginal discharge.   Review of Systems     Objective:   Physical Exam Alert and in no distress otherwise not examined Urine microscopic was essentially negative.        Assessment & Plan:  Burning with urination - Plan: POCT Urinalysis DIP (Proadvantage Device), sulfamethoxazole-trimethoprim (BACTRIM DS,SEPTRA DS) 800-160 MG tablet Since she is having symptoms from this, I will treat her with 3 days of Septra. She will call if she is not totally back to normal at the beginning of the week.

## 2017-06-14 ENCOUNTER — Other Ambulatory Visit: Payer: Self-pay | Admitting: Cardiology

## 2017-07-15 ENCOUNTER — Ambulatory Visit: Payer: Medicare Other | Admitting: Podiatry

## 2017-08-12 ENCOUNTER — Other Ambulatory Visit: Payer: Self-pay

## 2017-08-12 DIAGNOSIS — I252 Old myocardial infarction: Secondary | ICD-10-CM

## 2017-08-12 DIAGNOSIS — I6522 Occlusion and stenosis of left carotid artery: Secondary | ICD-10-CM

## 2017-09-01 ENCOUNTER — Encounter: Payer: Self-pay | Admitting: Family

## 2017-09-01 ENCOUNTER — Encounter: Payer: Self-pay | Admitting: Family Medicine

## 2017-09-01 ENCOUNTER — Ambulatory Visit (INDEPENDENT_AMBULATORY_CARE_PROVIDER_SITE_OTHER): Payer: Medicare Other | Admitting: Family Medicine

## 2017-09-01 ENCOUNTER — Ambulatory Visit: Payer: Medicare Other | Admitting: Family

## 2017-09-01 ENCOUNTER — Other Ambulatory Visit: Payer: Self-pay

## 2017-09-01 ENCOUNTER — Ambulatory Visit (HOSPITAL_COMMUNITY)
Admission: RE | Admit: 2017-09-01 | Discharge: 2017-09-01 | Disposition: A | Discharge status: Home / Self Care | Payer: Medicare Other | Source: Ambulatory Visit | Attending: Family | Admitting: Family

## 2017-09-01 VITALS — BP 148/76 | HR 70 | Temp 97.0°F | Resp 16 | Ht 63.0 in | Wt 145.0 lb

## 2017-09-01 VITALS — BP 122/80 | HR 76 | Resp 16 | Wt 144.4 lb

## 2017-09-01 DIAGNOSIS — R300 Dysuria: Secondary | ICD-10-CM | POA: Diagnosis not present

## 2017-09-01 DIAGNOSIS — I350 Nonrheumatic aortic (valve) stenosis: Secondary | ICD-10-CM

## 2017-09-01 DIAGNOSIS — Z8673 Personal history of transient ischemic attack (TIA), and cerebral infarction without residual deficits: Secondary | ICD-10-CM

## 2017-09-01 DIAGNOSIS — I252 Old myocardial infarction: Secondary | ICD-10-CM

## 2017-09-01 DIAGNOSIS — I6522 Occlusion and stenosis of left carotid artery: Secondary | ICD-10-CM

## 2017-09-01 DIAGNOSIS — I6523 Occlusion and stenosis of bilateral carotid arteries: Secondary | ICD-10-CM | POA: Diagnosis not present

## 2017-09-01 DIAGNOSIS — I6521 Occlusion and stenosis of right carotid artery: Secondary | ICD-10-CM | POA: Diagnosis not present

## 2017-09-01 LAB — VAS US CAROTID
LCCADDIAS: -20 cm/s
LCCAPSYS: 92 cm/s
LEFT ECA DIAS: -7 cm/s
LEFT VERTEBRAL DIAS: 27 cm/s
LICADDIAS: -39 cm/s
LICAPDIAS: 37 cm/s
LICAPSYS: 97 cm/s
Left CCA dist sys: -64 cm/s
Left CCA prox dias: 25 cm/s
Left ICA dist sys: -95 cm/s
RIGHT CCA MID DIAS: 12 cm/s
RIGHT ECA DIAS: -15 cm/s
RIGHT VERTEBRAL DIAS: 24 cm/s
Right CCA prox dias: 15 cm/s
Right CCA prox sys: 91 cm/s
Right cca dist sys: -119 cm/s

## 2017-09-01 LAB — POCT URINALYSIS DIP (PROADVANTAGE DEVICE)
BILIRUBIN UA: NEGATIVE
BILIRUBIN UA: NEGATIVE mg/dL
GLUCOSE UA: NEGATIVE mg/dL
Leukocytes, UA: NEGATIVE
Nitrite, UA: NEGATIVE
SPECIFIC GRAVITY, URINE: 1.025
Urobilinogen, Ur: NEGATIVE
pH, UA: 6 (ref 5.0–8.0)

## 2017-09-01 NOTE — Patient Instructions (Signed)
Make sure you drink plenty of fluids and use something like Metamucil to help with your stool

## 2017-09-01 NOTE — Patient Instructions (Signed)

## 2017-09-01 NOTE — Progress Notes (Signed)
Chief Complaint: Follow up Extracranial Carotid Artery Stenosis   History of Present Illness  [name] is a 84 y.o. female patient of Dr. Kellie Simmering seen for known extracranial carotid artery stenosis, right greater than left.  She returns today for follow up.   She also has a history of PAD followed by Dr. Ellyn Hack and is s/p right femoral artery stent in 2006, 2011, and 2012. Atherectomy with PTA alone in 2006; April 2000: Right SFA - overlapping stents (6.0 mm x 170 mm x2 and 6.0 mm x 40 mm); Cutting Balloon PTA of right SFA ISR -- patent by Dopplers and cath in 2012. Bilateral renal artery stents in 2006 Dr. Deon Pilling; patent as of 2012 cath. Right iliac artery stent in 2006. Popliteal angioplasty July 04, 2011 done in the same setting as PTA of ISR in right SFA.   Pt denies LE claudication symptoms with walking, denies non-healing wounds.   Patient has not had previous carotid artery intervention.  She states that she has a stroke in February, 2015 as manifested by right racial drooping and slurred speech that mostly resolved in a month, denies hemiparesis, denies monocular loss of vision, has had no further stroke or TIA's. She had a stroke about 2004 as manifested by left eye amaurosis fugax which lasted about 9 weeks, then resolved.   She has occasional burning and numbness in both feet.  She has had no headaches since 2013 or 2014.  Pt Diabetic: she has metabolic syndrome, review of records shows A1C of 6.2 in January 2016 Pt smoker: non-smoker  Pt meds include:  Statin : Yes  Betablocker: Yes  ASA: Yes Other anticoagulants/antiplatelets: Plavix    Past Medical History:  Diagnosis Date  . Anemia   . Aortic valve stenosis, senile calcific January 2011; Jan 2017   a. Echo: Normal EF 60-65%, mild aortic stenosis - estimated valve area 1.1 cm ; b. Feb '17: EF 65-70%, no RWMA. Gr 1 DD. Vigorous - dynamic outflow gradient (4.9 m/s). Mod AS (mean Grad 21  mmHg), Mild MS, Mod TR - PAP ~58 mmHg   . Arthritis   . Bilateral renal artery stenosis ([place]) 2006 ([place])   - Right renal artery 60% stenosis March '13  . Carotid artery occlusion   . Cerebral aneurysm without rupture    3 mm; followed by Dr. Arnoldo Morale as well as vascular surgery  . Chronic kidney disease 1964  . Coronary artery disease - history of MI October 2006   Severe RCA lesion -- 2.5 Mm x 12 Mm Vision BMS; January 2007 -. Distal stent stenosis in RCA treated with 2.5 mm x y 12 mm Vision BMS stent distally;  circumflex/OM stenosis -- 2.5 mg 12 mm Mini Vision BMS ;  most recent cath October 11: 100% RCA occlusion with ISR. Left right collaterals. Patent to the extent, the 70% AV groove circumflex. Apical LAD stenosis 70-80 %, diffuse disease; EF 65-70%   . DVT (deep venous thrombosis) ([place])   . Dyslipidemia, goal LDL below 70     no longer on statin  . Fall at home Jan. 22, 2016  . Hypertension    Previously on multiple medications now on only clonidine  . Non-ST elevation MI (NSTEMI) ([place]) 2006, 2011  . Ovarian cancer ([place]) 1963  . Peripheral vascular disease ([place]) 2010   Known distal left SFA occlusion, known right posterior tibial artery occlusion as well as left posterior and anterior tibial artery occlusion.; Most recent Dopplers November 2012:  Patent right SFA stent, left ABI 0.56  . Stroke [facility]) Feb. 2015   Carotid artery Doppler show right internal carotid 60-80% stenosis. Previously followed by Dr. Donzetta Kohut    Social History Social History   Tobacco Use  . Smoking status: Never Smoker  . Smokeless tobacco: Never Used  Substance Use Topics  . Alcohol use: No    Comment: social drinker  years ago-no longer  . Drug use: No    Family History Family History  Problem Relation Age of Onset  . Ovarian cancer Mother   . Heart attack Father   . Irritable bowel syndrome Daughter   . Alcoholism Brother   . Alcoholism Sister   . Alzheimer's disease Sister   . Heart attack  Daughter   . Colon cancer Neg Hx     Surgical History Past Surgical History:  Procedure Laterality Date  . ABDOMINAL HYSTERECTOMY    . APPENDECTOMY    . BLADDER SURGERY     bladder tack  . BREAST LUMPECTOMY Left   . CARDIAC CATHETERIZATION  05/28/2010   EF 65-70%,multi vessel CAD  . CAROTID DOPPLER  02/04/2012; January 2015   Done at [facility]----right internal 60 to 79%,bilateral external patent,bilateral common carotid artery tortuosity present  . CORONARY ANGIOPLASTY WITH STENT PLACEMENT  05/27/2005   RCA - MiniVision BMS 2.5 mm x 12 mm  . CORONARY ANGIOPLASTY WITH STENT PLACEMENT Bilateral 01/23/20072007   Circumflex-OM -- 2.5 mm x 12 mm Mini Vision BMS; RCA distal to previous stent 2.5 mm x 12 mm Mini Vision BMS  . DOPPLER ECHOCARDIOGRAPHY  09/25/2009   ef 60 to 65%,aotric valve mild stenosis 1.1cm^2([facility])  . FEMORAL ARTERY STENT Right 2006, 2011, 2012   Atherectomy with PTA alone in 2006; April 2000: Right SFA - overlapping stents (6.0 mm x 170 mm x2 and 6.0 mm x 40 mm); Cutting Balloon PTA of right SFA ISR -- patent by Dopplers and cath in 2012  . ILIAC ARTERY STENT Right 2006  . lower arterial extrem. doppler  07/23/2011   ABI RIGHT .92 ,LEFT ABI .52 ,RGT PROX.SFA STENT 0-49%,RGT SFA  W/O EVIDENCE OF SIGNIFICANT DIAMETER REDUCTION,RGT POPLITEAL ARTERY 50% NARROWING   . LOWER EXTREMITY ANGIOGRAM Left 07/04/2011   Procedure: LOWER EXTREMITY ANGIOGRAM;  Surgeon: Lorretta Harp, MD;  Location: [facility] CATH LAB;  Service: Cardiovascular;  Laterality: Left;  . NM MYOCAR PERF WALL MOTION  10/13/2009   EF 73%  . POPLITEAL ARTERY ANGIOPLASTY  July 04, 2011   Done in the same setting as PTA of ISR in right SFA  . RENAL ARTERY STENT Bilateral 2006   Dr. Deon Pilling; patent as of 2012 cath  . renal doppler  11/13/2011   bil.renal arterial stents patent w/less than 60%,right and left kidneys equal in size  . TRANSTHORACIC ECHOCARDIOGRAM  2/'16; 10/17/2015   a.Vigorous LV function, EF 60-70%.  Dynamic outflow tract obstruction. Midcavity obliteration. Peak intracavitary gradient 36 mmHg, moderate AS, Moderate-severely Ca++ MV -> mild MS. b. EF 65-70%, no RWMA. Gr 1 DD. Vigorous - dynamic outflow gradient (4.9 m/s). Mod AS (mean Grad 21 mmHg), Mild MS, Mod TR - PAP ~58 mmHg     Allergies  Allergen Reactions  . Codeine Rash    Current Outpatient Medications  Medication Sig Dispense Refill  . acetaminophen (TYLENOL) 500 MG tablet Take 500 mg by mouth every 6 (six) hours as needed for mild pain, moderate pain or headache.     Marland Kitchen amLODipine (NORVASC) 5 MG tablet take 1  tablet by mouth once daily 30 tablet 5  . aspirin 81 MG EC tablet Take 1 tablet (81 mg total) by mouth daily. Swallow whole. 30 tablet 12  . atorvastatin (LIPITOR) 10 MG tablet take 1 tablet by mouth once daily 90 tablet 1  . carvedilol (COREG) 6.25 MG tablet take 1 tablet by mouth twice a day with meals 60 tablet 11  . clopidogrel (PLAVIX) 75 MG tablet take 1 tablet by mouth once daily 30 tablet 11  . Propylene Glycol-Glycerin (SOOTHE OP) Place 1 drop into both eyes 2 (two) times daily.     Marland Kitchen atorvastatin (LIPITOR) 20 MG tablet Take 1 tablet (20 mg total) by mouth daily. 90 tablet 3  . chlorthalidone (HYGROTON) 25 MG tablet take 1/2 tablet by mouth once daily **KEEP OFFICE VISIT** 15 tablet 11  . famotidine-calcium carbonate-magnesium hydroxide (PEPCID COMPLETE) 10-800-165 MG chewable tablet Chew 1 tablet by mouth daily as needed. 30 tablet 0  . OVER THE COUNTER MEDICATION Take 1 capsule by mouth daily as needed (constipation). STOOL SOFTNER    . potassium chloride SA (K-DUR,KLOR-CON) 20 MEQ tablet take 1 tablet by mouth once daily 90 tablet 3   No current facility-administered medications for this visit.     Review of Systems : See HPI for pertinent positives and negatives.  Physical Examination  Vitals:   09/01/17 1535 09/01/17 1540 09/01/17 1541  BP: (!) 152/77 135/78 (!) 148/76  Pulse: 69 70 70  Resp: 16     Temp: (!) 97 F (36.1 C)    TempSrc: Oral    SpO2: 97%    Weight: 145 lb (65.8 kg)    Height: 5\' 3"  (1.6 m)     Body mass index is 25.69 kg/m.  General: WDWN elderly female in NAD  HEENT: No gross abnormalities.  GAIT:normal  Eyes: PERRLA Pulmonary: CTAB, respirations are non labored.  Cardiac: regular rhythm and rate, + murmur.  VASCULAR EXAM Carotid Bruits Left Right   Transmitted cardiac murmur Transmitted cardiac murmur   Abdominal aortic pulse not palpable.  Radial pulses are 1+ palpable and equal.   LE Pulses  LEFT  RIGHT   FEMORAL 2+ palpable 1+ palpable  POPLITEAL  not palpable  not palpable  POSTERIOR TIBIAL  not palpable  not palpable   DORSALIS PEDIS ANTERIOR TIBIAL  not palpable  faintly palpable    Gastrointestinal: soft, nontender, BS WNL, no r/g, no palpable masses, Muscoskeletal:  Extremities without ischemic changes. Right 4th toe tip is surgically absent. Skin: No rash, no cellulitis, no ulcers noted.  Neurologic: A&O X 3; Appropriate Affect ; SENSATION ;normal; M/S is 5/5 throughout, speech is normal, CN 2-12 intact, Pain and light touch intact in extremities, Motor exam as listed above      Assessment: [name] is a 84 y.o. female  who had a stroke in 2004 and again in February of 2015; no subsequent stroke or TIA.  Her atherosclerotic risk factors include metabolic syndrome, CAD, extensive family hx of CVD events, and advanced age. However, she is active for her advanced age and has never used tobacco.  Dr. Ellyn Hack has been monitoring patient's PAD, she has no signs of ischemia in her LE's.   DATA  Carotid duplex (09/01/17): 60 - 79 % right stenosis. Left ICA: 1 - 39 % stenosis. Bilateral vertebral artery flow is antegrade.  Bilateral subclavian artery waveforms are normal.  No significant change compared to the exam on 02-17-17.  CT of head on 09/27/13: IMPRESSION:  Evolving small right posterior frontal infarct (corresponding to  recent MRI abnormality), without acute intracranial process.  Left occipital encephalomalacia may reflect remote posterior  cerebral artery territory infarct or trauma. Moderate white matter  changes suggest chronic small vessel ischemic disease.    Plan: Follow-up in 6 months with Carotid Duplex scan.   I discussed in depth with the patient the nature of atherosclerosis, and emphasized the importance of maximal medical management including strict control of blood pressure, blood glucose, and lipid levels, obtaining regular exercise, and continued cessation of smoking.  The patient is aware that without maximal medical management the underlying atherosclerotic disease process will progress, limiting the benefit of any interventions. The patient was given information about stroke prevention and what symptoms should prompt the patient to seek immediate medical care. Thank you for allowing [place] to participate in this patient's care.  Clemon Chambers, RN, MSN, FNP-C [facility] Office: [facility] Physician: Trula Slade  09/01/17 3:54 PM

## 2017-09-01 NOTE — Progress Notes (Signed)
   Subjective:    Patient ID: [name], female    DOB: 1924/02/19, 84 y.o.   MRN: [id number]  HPI She is here for consult concerning multiple issues.  She mentioned problems with dysuria but then said that she is also having occasional difficulty with diarrhea and discomfort with BMs.  She then changed the subject to talk about burning in her feet.  No fever, chills, cough, congestion, DOE, PND or sore throat.   Review of Systems     Objective:   Physical Exam Alert and in no distress. Tympanic membranes and canals are normal. Pharyngeal area is normal. Neck is supple without adenopathy or thyromegaly. Cardiac exam shows a regular sinus rhythm with a 3/6 SEM no gallops. Lungs are clear to auscultation. Urine dipstick was positive for blood however the microscopic was negative.       Assessment & Plan:  Dysuria - Plan: POCT Urinalysis DIP (Proadvantage Device)  Aortic valve stenosis, senile calcific  I reassured her that I did not find anything of significance.  Recommend she keep herself well-hydrated.  Also discussed the need for a bulk laxative like Metamucil.

## 2017-09-01 NOTE — Progress Notes (Signed)
Vitals:   09/01/17 1535 09/01/17 1540  BP: (!) 152/77 135/78  Pulse: 69 70  Resp: 16   Temp: (!) 97 F (36.1 C)   TempSrc: Oral   SpO2: 97%   Weight: 145 lb (65.8 kg)   Height: 5\' 3"  (1.6 m)

## 2017-09-29 ENCOUNTER — Telehealth: Payer: Self-pay | Admitting: Family Medicine

## 2017-09-29 NOTE — Telephone Encounter (Signed)
Patient called and states she saw you about 3 weeks ago and her urine still stinks like fish and or popcorn.  She states you always tell her nothing is wrong.  She is concerned that her bladder has dropped.  Also patient wants and xray of her back, she fell a couple of different times in the past year and feels that something is broken.  Please advise pt [phone]

## 2017-09-29 NOTE — Telephone Encounter (Signed)
Have her come in so we can check in to all of this.

## 2017-09-29 NOTE — Telephone Encounter (Signed)
Spoke with patient and scheduled her for tomorrow at 10:30.

## 2017-09-30 ENCOUNTER — Ambulatory Visit (INDEPENDENT_AMBULATORY_CARE_PROVIDER_SITE_OTHER): Payer: Medicare Other | Admitting: Family Medicine

## 2017-09-30 ENCOUNTER — Encounter: Payer: Self-pay | Admitting: Family Medicine

## 2017-09-30 VITALS — BP 146/82 | HR 70 | Wt 143.8 lb

## 2017-09-30 DIAGNOSIS — R300 Dysuria: Secondary | ICD-10-CM

## 2017-09-30 LAB — POCT URINALYSIS DIP (PROADVANTAGE DEVICE)
BILIRUBIN UA: NEGATIVE mg/dL
Bilirubin, UA: NEGATIVE
Glucose, UA: NEGATIVE mg/dL
Leukocytes, UA: NEGATIVE
Nitrite, UA: NEGATIVE
PH UA: 6 (ref 5.0–8.0)
SPECIFIC GRAVITY, URINE: 1.03
UUROB: 3.5

## 2017-09-30 NOTE — Progress Notes (Signed)
   Subjective:    Patient ID: [name], female    DOB: Jan 14, 1924, 84 y.o.   MRN: [id number]  HPI She complains of a several week history of intermittent foul smell to her urine as well as occasional pain with urination but no urgency, frequency, abdominal pain.  She also complains of intermittent burning sensation to her mid back area however it is not associated with her urinary symptoms.  No numbness, tingling or weakness.   Review of Systems     Objective:   Physical Exam Alert and in no distress.  Full motion of the back.  No palpable tenderness.  Normal lumbar curve and motion. Urine dipstick showed red cells however microscopic was negative.       Assessment & Plan:  Burning with urination - Plan: CULTURE, URINE COMPREHENSIVE I reassured her that I found nothing wrong with her back.  I would do a urine culture on her. She would also like to come back next week to go over all of her medications and make sure that there are no adverse interactions.

## 2017-10-03 LAB — CULTURE, URINE COMPREHENSIVE

## 2017-10-29 ENCOUNTER — Encounter: Payer: Medicare Other | Admitting: Family Medicine

## 2017-11-06 ENCOUNTER — Ambulatory Visit (INDEPENDENT_AMBULATORY_CARE_PROVIDER_SITE_OTHER): Payer: Medicare Other | Admitting: Medical

## 2017-11-06 ENCOUNTER — Encounter: Payer: Self-pay | Admitting: Medical

## 2017-11-06 VITALS — BP 118/72 | HR 67 | Temp 97.9°F | Wt 144.6 lb

## 2017-11-06 DIAGNOSIS — F419 Anxiety disorder, unspecified: Secondary | ICD-10-CM

## 2017-11-06 DIAGNOSIS — R195 Other fecal abnormalities: Secondary | ICD-10-CM

## 2017-11-06 DIAGNOSIS — B3731 Acute candidiasis of vulva and vagina: Secondary | ICD-10-CM | POA: Insufficient documentation

## 2017-11-06 DIAGNOSIS — R300 Dysuria: Secondary | ICD-10-CM | POA: Diagnosis not present

## 2017-11-06 DIAGNOSIS — Z636 Dependent relative needing care at home: Secondary | ICD-10-CM | POA: Diagnosis not present

## 2017-11-06 DIAGNOSIS — B373 Candidiasis of vulva and vagina: Secondary | ICD-10-CM

## 2017-11-06 LAB — POCT URINALYSIS DIP (PROADVANTAGE DEVICE)
BILIRUBIN UA: NEGATIVE
BILIRUBIN UA: NEGATIVE mg/dL
Glucose, UA: NEGATIVE mg/dL
LEUKOCYTES UA: NEGATIVE
Nitrite, UA: NEGATIVE
Specific Gravity, Urine: 1.025
Urobilinogen, Ur: NEGATIVE
pH, UA: 6 (ref 5.0–8.0)

## 2017-11-06 MED ORDER — FLUCONAZOLE 150 MG PO TABS: ORAL_TABLET | ORAL | 0 refills | Status: DC

## 2017-11-06 NOTE — Progress Notes (Signed)
Subjective: Chief Complaint  Patient presents with  . burning  , ordor,anxiety    burning , ordor,  when off an on anxiety    Here for several concerns today.  She continues to drive and feels healthy for her age.  She drove herself here today.  This is my first meeting with her today.  She reports ongoing odor in the genital area.  Saw Dr. Redmond School here a few weeks ago for similar symptoms and urinary symptoms but no culture results showed infection.  She denies discharge, no burning with urination, no back pain, no blood in the urine, no foul urine odor, no urgency of the urine.  Other people have commented on her odor as well in the house.  She notes intermittent loose stools for the past 4 months.  Some abdominal discomfort.  Has been on antibiotic in recent months.  No fever.  No recent travel.  She notes anxiety over her granddaughter.  She raised her granddaughter from the time she was 72 days old.  Her mother is in and out of jail lives in [place], [place].  Her father has had drug issues for years but does see her every now and then.  Her granddaughter started having trouble with behavior and criminal offenses and school problems and drug issues for several years now.  This [place] worries for her own safety.  Her granddaughter talks to her like a dull, tears up her stuff, has drugs in her drawer in the house.  She recently called social services as she thinks its time for her grand daughter to be put in state custody given her out of control nature.   Past Medical History:  Diagnosis Date  . Anemia   . Aortic valve stenosis, senile calcific January 2011; Jan 2017   a. Echo: Normal EF 60-65%, mild aortic stenosis - estimated valve area 1.1 cm ; b. Feb '17: EF 65-70%, no RWMA. Gr 1 DD. Vigorous - dynamic outflow gradient (4.9 m/s). Mod AS (mean Grad 21 mmHg), Mild MS, Mod TR - PAP ~58 mmHg   . Arthritis   . Bilateral renal artery stenosis ([place]) 2006 ([place])   - Right renal artery 60%  stenosis March '13  . Carotid artery occlusion   . Cerebral aneurysm without rupture    3 mm; followed by Dr. Arnoldo Morale as well as vascular surgery  . Chronic kidney disease 1964  . Coronary artery disease - history of MI October 2006   Severe RCA lesion -- 2.5 Mm x 12 Mm Vision BMS; January 2007 -. Distal stent stenosis in RCA treated with 2.5 mm x y 12 mm Vision BMS stent distally;  circumflex/OM stenosis -- 2.5 mg 12 mm Mini Vision BMS ;  most recent cath October 11: 100% RCA occlusion with ISR. Left right collaterals. Patent to the extent, the 70% AV groove circumflex. Apical LAD stenosis 70-80 %, diffuse disease; EF 65-70%   . DVT (deep venous thrombosis) ([place])   . Dyslipidemia, goal LDL below 70     no longer on statin  . Fall at home Jan. 22, 2016  . Hypertension    Previously on multiple medications now on only clonidine  . Non-ST elevation MI (NSTEMI) ([place]) 2006, 2011  . Ovarian cancer ([place]) 1963  . Peripheral vascular disease ([place]) 2010   Known distal left SFA occlusion, known right posterior tibial artery occlusion as well as left posterior and anterior tibial artery occlusion.; Most recent Dopplers November 2012: Patent right SFA stent,  left ABI 0.56  . Stroke [facility]) Feb. 2015   Carotid artery Doppler show right internal carotid 60-80% stenosis. Previously followed by Dr. Donzetta Kohut   Current Outpatient Medications on File Prior to Visit  Medication Sig Dispense Refill  . acetaminophen (TYLENOL) 500 MG tablet Take 500 mg by mouth every 6 (six) hours as needed for mild pain, moderate pain or headache.     Marland Kitchen amLODipine (NORVASC) 5 MG tablet take 1 tablet by mouth once daily 30 tablet 5  . aspirin 81 MG EC tablet Take 1 tablet (81 mg total) by mouth daily. Swallow whole. 30 tablet 12  . atorvastatin (LIPITOR) 10 MG tablet take 1 tablet by mouth once daily 90 tablet 1  . carvedilol (COREG) 6.25 MG tablet take 1 tablet by mouth twice a day with meals 60 tablet 11  . chlorthalidone (HYGROTON)  25 MG tablet take 1/2 tablet by mouth once daily **KEEP OFFICE VISIT** 15 tablet 11  . clopidogrel (PLAVIX) 75 MG tablet take 1 tablet by mouth once daily 30 tablet 11  . famotidine-calcium carbonate-magnesium hydroxide (PEPCID COMPLETE) 10-800-165 MG chewable tablet Chew 1 tablet by mouth daily as needed. 30 tablet 0  . potassium chloride SA (K-DUR,KLOR-CON) 20 MEQ tablet take 1 tablet by mouth once daily 90 tablet 3  . Propylene Glycol-Glycerin (SOOTHE OP) Place 1 drop into both eyes 2 (two) times daily.     Marland Kitchen atorvastatin (LIPITOR) 20 MG tablet Take 1 tablet (20 mg total) by mouth daily. 90 tablet 3  . OVER THE COUNTER MEDICATION Take 1 capsule by mouth daily as needed (constipation). STOOL SOFTNER     No current facility-administered medications on file prior to visit.    ROS as in subjective  Objective BP 118/72   Pulse 67   Temp 97.9 F (36.6 C)   Wt 144 lb 9.6 oz (65.6 kg)   SpO2 96%   BMI 25.61 kg/m   Gen: wd, WN, nad Abdomen: +bs, soft, nontender, no mass, no organomegaly Gyn: normal external genitalia but atrophic changes, swab taken, limited exam, chaperoned by nurse   Assessment: Encounter Diagnoses  Name Primary?  . Burning with urination Yes  . Loose stools   . Yeast vaginitis   . Caregiver burden   . Anxiety      Plan: We discussed her caregiver burden, anxiety-counseled on ways to move forward, she will consult with social services about her granddaughter and about her personal safety.  We both agree that at her age she shouldn't have to deal with her 26yo granddaughter causing turmoil in the home, at school, and having drugs in the home, and prior arrests.  Recommend she seek counseling.  Loose stool-etiology unclear.  We sent her home with stool kit to collect and return for stool studies  Yeast vaginitis-begin Diflucan as below  Follow-up with Dr. Redmond School soon  [name] was seen today for burning  , ordor,anxiety.  Diagnoses and all orders for this  visit:  Burning with urination -     POCT Urinalysis DIP (Proadvantage Device)  Loose stools -     Stool culture; Future -     Clostridium difficile EIA; Future -     Ova and parasite examination; Future  Yeast vaginitis  Caregiver burden  Anxiety  Other orders -     fluconazole (DIFLUCAN) 150 MG tablet; 1 tablet now, may repeat in 1 week

## 2017-11-07 ENCOUNTER — Other Ambulatory Visit: Payer: Self-pay

## 2017-11-07 ENCOUNTER — Telehealth: Payer: Self-pay | Admitting: Family Medicine

## 2017-11-07 DIAGNOSIS — R195 Other fecal abnormalities: Secondary | ICD-10-CM

## 2017-11-07 NOTE — Telephone Encounter (Signed)
Per [employer] instructions Adult [employer] was called and I spoke to [employer]. I explained the situation that arose from yesterday's visit with [facility] and the information that pt passed on to him. She explained to me that they can only help with adults that are considered handicapped. She referred me to [place].   I called and spoke to Clemon Chambers who works in adult services. I explained to her the situation and she stated that this will be a complicated case due to the fact that child is a minor. She stated that multiple agencies will need to be involved.  She also stated she would need to hear from pt herself.    I called patient and explained to her that I had been asked to try to find her some help for her situation. I told her about my conversation with Clemon Chambers. I provided her the name and number, [phone],  for her to call. She stated she appreciated the help and she was going to call.  She did give me verbal consent to give Roselyn Reef her phone number so she could call her. I called Roselyn Reef back and left a message for her to call me back so I could provide additional info and pt's number.

## 2017-11-12 LAB — STOOL CULTURE: E coli, Shiga toxin Assay: NEGATIVE

## 2017-11-12 LAB — OVA AND PARASITE EXAMINATION

## 2017-11-12 LAB — CLOSTRIDIUM DIFFICILE EIA: C difficile Toxins A+B, EIA: NEGATIVE

## 2017-11-13 ENCOUNTER — Telehealth: Payer: Self-pay

## 2017-11-13 NOTE — Telephone Encounter (Signed)
Called patient she did not answer, contacted grand daughter and LVM advising that we had lab results and she was on DPR to give information. Given call back number to discuss.

## 2017-11-13 NOTE — Telephone Encounter (Signed)
-----   Message from Carlena Hurl, PA-C sent at 11/13/2017  7:51 AM EDT ----- Please call and see if she is still having diarrhea.   The stool culture shows a few bacteria but not sure if this represents the cause of her diarrhea.  The rest of the stool studies were fine.

## 2017-11-14 NOTE — Telephone Encounter (Signed)
See note

## 2017-11-14 NOTE — Telephone Encounter (Signed)
UPDATE. Pt called me this morning and stated that she continues to have issues with her granddaughter. She states that the only option she was provided was to send her to a facility in [facility]. Pt states she did not want to that. Pt states she has a close family friend that has agreed to take over care of granddaughter. Pt states she is scheduled to see a Marveen Reeks at the courthouse to transfer custody of the child Monday. She thinks this is the best option. Miss [place] states she will keep me updated. Sending back to [place] to keep them up to date.

## 2017-11-18 ENCOUNTER — Telehealth: Payer: Self-pay

## 2017-11-18 NOTE — Telephone Encounter (Signed)
Called patient and advised of results.  Patient stated her diarrhea was improved.

## 2017-11-18 NOTE — Telephone Encounter (Signed)
-----   Message from Carlena Hurl, PA-C sent at 11/13/2017  7:51 AM EDT ----- Please call and see if she is still having diarrhea.   The stool culture shows a few bacteria but not sure if this represents the cause of her diarrhea.  The rest of the stool studies were fine.

## 2017-11-19 ENCOUNTER — Other Ambulatory Visit: Payer: Self-pay | Admitting: Medical

## 2017-11-19 MED ORDER — AZITHROMYCIN 250 MG PO TABS: ORAL_TABLET | ORAL | 0 refills | Status: DC

## 2017-11-19 NOTE — Telephone Encounter (Signed)
zpak sent

## 2017-11-19 NOTE — Telephone Encounter (Signed)
Pt is aware. [facility] 

## 2017-11-19 NOTE — Telephone Encounter (Signed)
Pt was given results . Pt is requesting something be sent in for cold pt has had this last Friday. Been around sick kids. Please advise if abx or any otc cold med she can take. Pt says she cant come in. [facility]

## 2017-11-26 ENCOUNTER — Ambulatory Visit
Admission: RE | Admit: 2017-11-26 | Discharge: 2017-11-26 | Disposition: A | Discharge status: Home / Self Care | Payer: Medicare Other | Source: Ambulatory Visit | Attending: Medical | Admitting: Medical

## 2017-11-26 ENCOUNTER — Ambulatory Visit (INDEPENDENT_AMBULATORY_CARE_PROVIDER_SITE_OTHER): Payer: Medicare Other | Admitting: Medical

## 2017-11-26 ENCOUNTER — Encounter: Payer: Self-pay | Admitting: Medical

## 2017-11-26 VITALS — BP 116/74 | HR 66 | Temp 97.7°F | Wt 145.0 lb

## 2017-11-26 DIAGNOSIS — R05 Cough: Secondary | ICD-10-CM

## 2017-11-26 DIAGNOSIS — J988 Other specified respiratory disorders: Secondary | ICD-10-CM

## 2017-11-26 DIAGNOSIS — R059 Cough, unspecified: Secondary | ICD-10-CM

## 2017-11-26 DIAGNOSIS — R7301 Impaired fasting glucose: Secondary | ICD-10-CM | POA: Diagnosis not present

## 2017-11-26 DIAGNOSIS — R1013 Epigastric pain: Secondary | ICD-10-CM

## 2017-11-26 DIAGNOSIS — R195 Other fecal abnormalities: Secondary | ICD-10-CM | POA: Diagnosis not present

## 2017-11-26 DIAGNOSIS — N898 Other specified noninflammatory disorders of vagina: Secondary | ICD-10-CM

## 2017-11-26 LAB — POCT URINALYSIS DIP (PROADVANTAGE DEVICE)
Bilirubin, UA: NEGATIVE
Glucose, UA: NEGATIVE mg/dL
Ketones, POC UA: NEGATIVE mg/dL
Leukocytes, UA: NEGATIVE
NITRITE UA: NEGATIVE
PH UA: 6 (ref 5.0–8.0)
Specific Gravity, Urine: 1.03
UUROB: NEGATIVE

## 2017-11-26 MED ORDER — FLUCONAZOLE 150 MG PO TABS: ORAL_TABLET | ORAL | 0 refills | Status: DC

## 2017-11-26 MED ORDER — FAMOTIDINE-CA CARB-MAG HYDROX 10-800-165 MG PO CHEW: 1.0000 | CHEWABLE_TABLET | Freq: Every day | ORAL | 0 refills | Status: DC | PRN

## 2017-11-26 NOTE — Progress Notes (Signed)
Subjective: Chief Complaint  Patient presents with  . Acute Visit    x4weeks, had medication given not helping, still congested, stomach pains   Here for ongoing cold symptoms.  Still feels congested with mucous in chest and head, upper stomach discomfort.  Denies acid reflux, heartburn. Having some cough with phlegm.  Had some sore throat, but not bad now. No fever.  No nausea or vomiting.   Has burning sensation ongoing in the vaginal area for weeks.  No vaginal discharge, no bleeding.  Bowels still feel like they have fever in them.  The loose stool has calmed down.    Denies urinary incontinence.  No fecal incontinence.  I saw her 11/06/17 for several concerns including odor in the genital area.   At that visit she reported seeing Dr. Redmond School here a few weeks ago for similar symptoms and urinary symptoms but no culture results showed infection.  She denies discharge, no burning with urination, no back pain, no blood in the urine, no foul urine odor, no urgency of the urine.  Other people have commented on her odor as well in the house.  She notes intermittent loose stools for the past 4 months.  Some abdominal discomfort.  Has been on antibiotic in recent months.  No fever.  No recent travel.   Past Medical History:  Diagnosis Date  . Anemia   . Aortic valve stenosis, senile calcific January 2011; Jan 2017   a. Echo: Normal EF 60-65%, mild aortic stenosis - estimated valve area 1.1 cm ; b. Feb '17: EF 65-70%, no RWMA. Gr 1 DD. Vigorous - dynamic outflow gradient (4.9 m/s). Mod AS (mean Grad 21 mmHg), Mild MS, Mod TR - PAP ~58 mmHg   . Arthritis   . Bilateral renal artery stenosis ([place]) 2006 ([place])   - Right renal artery 60% stenosis March '13  . Carotid artery occlusion   . Cerebral aneurysm without rupture    3 mm; followed by Dr. Arnoldo Morale as well as vascular surgery  . Chronic kidney disease 1964  . Coronary artery disease - history of MI October 2006   Severe RCA lesion -- 2.5 Mm x 12  Mm Vision BMS; January 2007 -. Distal stent stenosis in RCA treated with 2.5 mm x y 12 mm Vision BMS stent distally;  circumflex/OM stenosis -- 2.5 mg 12 mm Mini Vision BMS ;  most recent cath October 11: 100% RCA occlusion with ISR. Left right collaterals. Patent to the extent, the 70% AV groove circumflex. Apical LAD stenosis 70-80 %, diffuse disease; EF 65-70%   . DVT (deep venous thrombosis) ([place])   . Dyslipidemia, goal LDL below 70     no longer on statin  . Fall at home Jan. 22, 2016  . Hypertension    Previously on multiple medications now on only clonidine  . Non-ST elevation MI (NSTEMI) ([place]) 2006, 2011  . Ovarian cancer ([place]) 1963  . Peripheral vascular disease ([place]) 2010   Known distal left SFA occlusion, known right posterior tibial artery occlusion as well as left posterior and anterior tibial artery occlusion.; Most recent Dopplers November 2012: Patent right SFA stent, left ABI 0.56  . Stroke [facility]) Feb. 2015   Carotid artery Doppler show right internal carotid 60-80% stenosis. Previously followed by Dr. Donzetta Kohut   Current Outpatient Medications on File Prior to Visit  Medication Sig Dispense Refill  . acetaminophen (TYLENOL) 500 MG tablet Take 500 mg by mouth every 6 (six) hours as needed for mild  pain, moderate pain or headache.     Marland Kitchen amLODipine (NORVASC) 5 MG tablet take 1 tablet by mouth once daily 30 tablet 5  . aspirin 81 MG EC tablet Take 1 tablet (81 mg total) by mouth daily. Swallow whole. 30 tablet 12  . atorvastatin (LIPITOR) 10 MG tablet take 1 tablet by mouth once daily 90 tablet 1  . carvedilol (COREG) 6.25 MG tablet take 1 tablet by mouth twice a day with meals 60 tablet 11  . chlorthalidone (HYGROTON) 25 MG tablet take 1/2 tablet by mouth once daily **KEEP OFFICE VISIT** 15 tablet 11  . clopidogrel (PLAVIX) 75 MG tablet take 1 tablet by mouth once daily 30 tablet 11  . OVER THE COUNTER MEDICATION Take 1 capsule by mouth daily as needed (constipation). STOOL SOFTNER     . potassium chloride SA (K-DUR,KLOR-CON) 20 MEQ tablet take 1 tablet by mouth once daily 90 tablet 3  . Propylene Glycol-Glycerin (SOOTHE OP) Place 1 drop into both eyes 2 (two) times daily.     Marland Kitchen atorvastatin (LIPITOR) 20 MG tablet Take 1 tablet (20 mg total) by mouth daily. 90 tablet 3   No current facility-administered medications on file prior to visit.    ROS as in subjective     Objective BP 116/74 (BP Location: Right Arm, Patient Position: Sitting, Cuff Size: Normal)   Pulse 66   Temp 97.7 F (36.5 C) (Oral)   Wt 145 lb (65.8 kg)   SpO2 96%   BMI 25.69 kg/m    Wt Readings from Last 3 Encounters:  11/26/17 145 lb (65.8 kg)  11/06/17 144 lb 9.6 oz (65.6 kg)  09/30/17 143 lb 12.8 oz (65.2 kg)    General appearance: alert, no distress, WD/WN,  HEENT: normocephalic, sclerae anicteric, TMs pearly, nares patent, no discharge or erythema, pharynx normal Oral cavity: MMM, no lesions Neck: supple, no lymphadenopathy, no thyromegaly, no masses Lungs: CTA bilaterally, no wheezes, rhonchi, or rales Abdomen: +bs, soft, mild epigastric tenderness, otherwise non tender, non distended, no masses, no hepatomegaly, no splenomegaly Pulses: 2+ symmetric, upper and lower extremities, normal cap refill Ext: no edema Gyn - deferred    Assessment: Encounter Diagnoses  Name Primary?  . Cough Yes  . Impaired fasting blood sugar   . Respiratory tract infection   . Loose stools   . Epigastric pain   . Vaginal irritation       Plan: Cough, respiratory tract infection - labs today, go for CXR, can use OTC mucinex DM, hydrate well  Impaired glucose - she wants her diabetes marker checked today  Loose stool- reviewed recent culture, mostly resolved, much improved from last visit  Epigastric pain - possibly GERD.  Will send for KUB.  Vaginal irritations - begin another round of diflucan  Begin probiotic, avoid GERD triggers, begin trial of medication below  Fredrick was seen  today for acute visit.  Diagnoses and all orders for this visit:  Cough -     Comprehensive metabolic panel -     CBC with Differential/Platelet -     Hemoglobin A1c -     DG Chest 2 View; Future -     DG Abd 1 View; Future  Impaired fasting blood sugar -     Hemoglobin A1c  Respiratory tract infection -     Comprehensive metabolic panel -     CBC with Differential/Platelet -     Hemoglobin A1c -     DG Chest 2 View; Future -  DG Abd 1 View; Future  Loose stools -     Comprehensive metabolic panel -     CBC with Differential/Platelet -     Hemoglobin A1c  Epigastric pain -     Comprehensive metabolic panel -     CBC with Differential/Platelet -     Hemoglobin A1c -     DG Chest 2 View; Future -     DG Abd 1 View; Future  Vaginal irritation  Other orders -     fluconazole (DIFLUCAN) 150 MG tablet; 1 tablet every other day for a week -     famotidine-calcium carbonate-magnesium hydroxide (PEPCID COMPLETE) [phone] MG chewable tablet; Chew 1 tablet by mouth daily as needed.

## 2017-11-26 NOTE — Patient Instructions (Addendum)
Encounter Diagnoses  Name Primary?  . Cough Yes  . Impaired fasting blood sugar   . Respiratory tract infection   . Loose stools   . Epigastric pain   . Vaginal irritation     Recommendations:  Cough and respiratory tract infection  We are checking your blood count today.  Go for a chest xray given the ongoing symptoms  You can use over the counter Mucinex DM for cough and congestion  hydrate well with water  We are check labs today including your diabetes screen  Epigastric abdominal pain   Begin trial of Pepcid Complete tablet daily for possible acid reflux   Avoid spicy foods, fried foods, hot sauce, peppers, and don't eat within 2 hours of bedtime  Go for KUB xray  Vagina irritation  Lets do one more round of the diflucan tablet by mouth since you have been on recent antibiotic  Take diflucan 1 tablet every other day for 3 doses  If itching or burning you can try some over the counter Hydrocortisone cream for a few days or you can try over the counter Monistat cream  If the vaginal burning and irritation doesn't clear up, we may need to refer back to specialist.   Please go to [facility] for your chest and abdomen xray.   Their hours are 8am - 4:30 pm Monday - Friday.  Take your insurance card with you.  [facility] [phone]  [place] [address], [place], [place] [address]  [address] [place], [place] [address]

## 2017-11-26 NOTE — Addendum Note (Signed)
Addended by: Caprice Beaver T on: 11/26/2017 11:28 AM   Modules accepted: Orders

## 2017-11-27 ENCOUNTER — Telehealth: Payer: Self-pay

## 2017-11-27 LAB — COMPREHENSIVE METABOLIC PANEL
ALT: 7 IU/L (ref 0–32)
AST: 16 IU/L (ref 0–40)
Albumin/Globulin Ratio: 1.6 (ref 1.2–2.2)
Albumin: 4.2 g/dL (ref 3.2–4.6)
Alkaline Phosphatase: 97 IU/L (ref 39–117)
BILIRUBIN TOTAL: 0.4 mg/dL (ref 0.0–1.2)
BUN/Creatinine Ratio: 13 (ref 12–28)
BUN: 22 mg/dL (ref 10–36)
CHLORIDE: 105 mmol/L (ref 96–106)
CO2: 24 mmol/L (ref 20–29)
CREATININE: 1.75 mg/dL — AB (ref 0.57–1.00)
Calcium: 10.4 mg/dL — ABNORMAL HIGH (ref 8.7–10.3)
GFR calc non Af Amer: 25 mL/min/{1.73_m2} — ABNORMAL LOW (ref >59)
GFR, EST AFRICAN AMERICAN: 29 mL/min/{1.73_m2} — AB (ref >59)
GLUCOSE: 122 mg/dL — AB (ref 65–99)
Globulin, Total: 2.6 g/dL (ref 1.5–4.5)
Potassium: 3.6 mmol/L (ref 3.5–5.2)
Sodium: 144 mmol/L (ref 134–144)
TOTAL PROTEIN: 6.8 g/dL (ref 6.0–8.5)

## 2017-11-27 LAB — CBC WITH DIFFERENTIAL/PLATELET
BASOS ABS: 0 10*3/uL (ref 0.0–0.2)
Basos: 0 %
EOS (ABSOLUTE): 0.3 10*3/uL (ref 0.0–0.4)
EOS: 5 %
HEMATOCRIT: 34.9 % (ref 34.0–46.6)
Hemoglobin: 11.5 g/dL (ref 11.1–15.9)
IMMATURE GRANS (ABS): 0 10*3/uL (ref 0.0–0.1)
Immature Granulocytes: 0 %
LYMPHS: 32 %
Lymphocytes Absolute: 2 10*3/uL (ref 0.7–3.1)
MCH: 32.3 pg (ref 26.6–33.0)
MCHC: 33 g/dL (ref 31.5–35.7)
MCV: 98 fL — ABNORMAL HIGH (ref 79–97)
MONOCYTES: 7 %
Monocytes Absolute: 0.5 10*3/uL (ref 0.1–0.9)
NEUTROS ABS: 3.4 10*3/uL (ref 1.4–7.0)
Neutrophils: 56 %
Platelets: 200 10*3/uL (ref 150–379)
RBC: 3.56 x10E6/uL — AB (ref 3.77–5.28)
RDW: 12.8 % (ref 12.3–15.4)
WBC: 6.1 10*3/uL (ref 3.4–10.8)

## 2017-11-27 LAB — HEMOGLOBIN A1C
ESTIMATED AVERAGE GLUCOSE: 123 mg/dL
HEMOGLOBIN A1C: 5.9 % — AB (ref 4.8–5.6)

## 2017-11-27 NOTE — Telephone Encounter (Signed)
-----   Message from Carlena Hurl, PA-C sent at 11/27/2017  8:08 AM EDT ----- [place] Brunette were ok, labs stable, no worrisome findings.  Have her begin the 2 medications sent yesterday, Pepcid for upper belly pain, acid reflux and cough, Diflucan for vaginal irritation, and can use OTC Mucinex DM for cough/congestion. No signs of ongoing infection.  If not much improved with cough, belly pain, or vaginal irritation in the next 7-10 days, let me know.

## 2017-11-27 NOTE — Telephone Encounter (Signed)
Called patient and gave lab results. Patient had no questions or concerns.  

## 2017-12-03 DIAGNOSIS — H26492 Other secondary cataract, left eye: Secondary | ICD-10-CM | POA: Diagnosis not present

## 2017-12-09 ENCOUNTER — Telehealth: Payer: Self-pay | Admitting: Internal Medicine

## 2017-12-09 ENCOUNTER — Other Ambulatory Visit: Payer: Self-pay | Admitting: Medical

## 2017-12-09 MED ORDER — FLUOXETINE HCL 10 MG PO CAPS: 10.0000 mg | ORAL_CAPSULE | Freq: Every day | ORAL | 1 refills | Status: DC

## 2017-12-09 NOTE — Telephone Encounter (Signed)
Pt called stating that she thinks her vagina burning and stomach growling is coming from her nerves and she would like some nerve medication. Please send to [facility] [address]

## 2017-12-09 NOTE — Telephone Encounter (Signed)
Begin trial of medication called Prozac low dose once daily.   Lets have her recheck in 10-14 days to make sure she is tolerating this.

## 2017-12-09 NOTE — Telephone Encounter (Signed)
Pt was notified and will call back to reschedule

## 2017-12-12 ENCOUNTER — Other Ambulatory Visit: Payer: Self-pay | Admitting: Cardiology

## 2017-12-17 ENCOUNTER — Other Ambulatory Visit: Payer: Self-pay | Admitting: Cardiology

## 2017-12-18 NOTE — Telephone Encounter (Signed)
REFILL 

## 2017-12-22 ENCOUNTER — Ambulatory Visit: Payer: Self-pay | Admitting: Family Medicine

## 2018-01-09 ENCOUNTER — Other Ambulatory Visit: Payer: Self-pay | Admitting: Cardiology

## 2018-01-13 ENCOUNTER — Other Ambulatory Visit: Payer: Self-pay | Admitting: Cardiology

## 2018-01-13 NOTE — Telephone Encounter (Signed)
REFILL 

## 2018-01-20 ENCOUNTER — Other Ambulatory Visit: Payer: Self-pay | Admitting: Cardiology

## 2018-01-20 NOTE — Telephone Encounter (Signed)
Rx sent to pharmacy   

## 2018-02-04 ENCOUNTER — Other Ambulatory Visit: Payer: Self-pay | Admitting: Medical

## 2018-02-04 NOTE — Telephone Encounter (Signed)
Get her in for 1 month follow up on Prozac and mood.   Send #[phone] if she will run out prior to appt

## 2018-02-05 ENCOUNTER — Other Ambulatory Visit: Payer: Self-pay | Admitting: Cardiology

## 2018-02-05 NOTE — Telephone Encounter (Signed)
REFILL 

## 2018-02-08 ENCOUNTER — Other Ambulatory Visit: Payer: Self-pay | Admitting: Cardiology

## 2018-02-09 ENCOUNTER — Encounter: Payer: Self-pay | Admitting: Medical

## 2018-02-12 ENCOUNTER — Ambulatory Visit: Payer: Medicare Other | Admitting: Podiatry

## 2018-02-17 ENCOUNTER — Encounter: Payer: Self-pay | Admitting: Medical

## 2018-02-17 ENCOUNTER — Ambulatory Visit (INDEPENDENT_AMBULATORY_CARE_PROVIDER_SITE_OTHER): Payer: Medicare Other | Admitting: Medical

## 2018-02-17 VITALS — BP 120/64 | HR 69 | Resp 16 | Ht 63.5 in | Wt 139.8 lb

## 2018-02-17 DIAGNOSIS — I10 Essential (primary) hypertension: Secondary | ICD-10-CM | POA: Diagnosis not present

## 2018-02-17 DIAGNOSIS — R1012 Left upper quadrant pain: Secondary | ICD-10-CM | POA: Diagnosis not present

## 2018-02-17 DIAGNOSIS — E785 Hyperlipidemia, unspecified: Secondary | ICD-10-CM

## 2018-02-17 DIAGNOSIS — I739 Peripheral vascular disease, unspecified: Secondary | ICD-10-CM | POA: Diagnosis not present

## 2018-02-17 DIAGNOSIS — I25119 Atherosclerotic heart disease of native coronary artery with unspecified angina pectoris: Secondary | ICD-10-CM

## 2018-02-17 DIAGNOSIS — I6522 Occlusion and stenosis of left carotid artery: Secondary | ICD-10-CM | POA: Diagnosis not present

## 2018-02-17 DIAGNOSIS — I350 Nonrheumatic aortic (valve) stenosis: Secondary | ICD-10-CM

## 2018-02-17 DIAGNOSIS — Z636 Dependent relative needing care at home: Secondary | ICD-10-CM | POA: Diagnosis not present

## 2018-02-17 DIAGNOSIS — I252 Old myocardial infarction: Secondary | ICD-10-CM

## 2018-02-17 DIAGNOSIS — N183 Chronic kidney disease, stage 3 unspecified: Secondary | ICD-10-CM

## 2018-02-17 DIAGNOSIS — F419 Anxiety disorder, unspecified: Secondary | ICD-10-CM

## 2018-02-17 NOTE — Patient Instructions (Addendum)
Recommendations: Shingles vaccine:  I recommend you have a shingles vaccine to help prevent shingles or herpes zoster outbreak.   Please call your insurer to inquire about coverage for the Shingrix vaccine given in 2 doses.   Some insurers cover this vaccine after age 82, some cover this after age 29.  If your insurer covers this, then call to schedule appointment to have this vaccine here.  Also call insurance for coverage of tetanus booster   Continue current medications  Pending labs, we may end up recommended imaging for the abdominal pain

## 2018-02-17 NOTE — Progress Notes (Signed)
Subjective: Chief Complaint  Patient presents with  . med check    med check left side pain,    Here for med check.  Normally sees Dr. Redmond School here.   She notes her nerves/anxiety still giving her some problems.  Grand daughter is back with her  She had to kick her out a few moths ago for behavior issues, police having to come out to the house.   [place] daughter had stayed with another person for a few month.   [place] daughter is working lately and treating Mrs. Fairbank good lately.   She had started Prozac when granddaughter was giving her a lot of issues a few months ago.    However, when granddaughter moved out for a period, she stopped taking Prozac for a while.  Had no anxiety issues while grand daughter was out of the house.  No depression.  No HI/SI.  Still getting some left upper abdominal pain and stomach growls.  No fever.   No nausea, no vomiting.    No blood in stool.   No constipation, no diarrhea, has daily BM.  Gets up some to urinate at night but no other urinary issues.  No burning with urination  No other aggravating or relieving factors. No other complaint.  Past Medical History:  Diagnosis Date  . Anemia   . Aortic valve stenosis, senile calcific January 2011; Jan 2017   a. Echo: Normal EF 60-65%, mild aortic stenosis - estimated valve area 1.1 cm ; b. Feb '17: EF 65-70%, no RWMA. Gr 1 DD. Vigorous - dynamic outflow gradient (4.9 m/s). Mod AS (mean Grad 21 mmHg), Mild MS, Mod TR - PAP ~58 mmHg   . Arthritis   . Bilateral renal artery stenosis ([place]) 2006 ([place])   - Right renal artery 60% stenosis March '13  . Carotid artery occlusion   . Cerebral aneurysm without rupture    3 mm; followed by Dr. Arnoldo Morale as well as vascular surgery  . Chronic kidney disease 1964  . Coronary artery disease - history of MI October 2006   Severe RCA lesion -- 2.5 Mm x 12 Mm Vision BMS; January 2007 -. Distal stent stenosis in RCA treated with 2.5 mm x y 12 mm Vision BMS stent distally;   circumflex/OM stenosis -- 2.5 mg 12 mm Mini Vision BMS ;  most recent cath October 11: 100% RCA occlusion with ISR. Left right collaterals. Patent to the extent, the 70% AV groove circumflex. Apical LAD stenosis 70-80 %, diffuse disease; EF 65-70%   . DVT (deep venous thrombosis) ([place])   . Dyslipidemia, goal LDL below 70     no longer on statin  . Fall at home Jan. 22, 2016  . Hypertension    Previously on multiple medications now on only clonidine  . Non-ST elevation MI (NSTEMI) ([place]) 2006, 2011  . Ovarian cancer ([place]) 1963  . Peripheral vascular disease ([place]) 2010   Known distal left SFA occlusion, known right posterior tibial artery occlusion as well as left posterior and anterior tibial artery occlusion.; Most recent Dopplers November 2012: Patent right SFA stent, left ABI 0.56  . Stroke [facility]) Feb. 2015   Carotid artery Doppler show right internal carotid 60-80% stenosis. Previously followed by Dr. Donzetta Kohut   Past Surgical History:  Procedure Laterality Date  . ABDOMINAL HYSTERECTOMY    . APPENDECTOMY    . BLADDER SURGERY     bladder tack  . BREAST LUMPECTOMY Left   . CARDIAC CATHETERIZATION  05/28/2010  EF 65-70%,multi vessel CAD  . CAROTID DOPPLER  02/04/2012; January 2015   Done at [facility]----right internal 60 to 79%,bilateral external patent,bilateral common carotid artery tortuosity present  . CORONARY ANGIOPLASTY WITH STENT PLACEMENT  05/27/2005   RCA - MiniVision BMS 2.5 mm x 12 mm  . CORONARY ANGIOPLASTY WITH STENT PLACEMENT Bilateral 01/23/20072007   Circumflex-OM -- 2.5 mm x 12 mm Mini Vision BMS; RCA distal to previous stent 2.5 mm x 12 mm Mini Vision BMS  . DOPPLER ECHOCARDIOGRAPHY  09/25/2009   ef 60 to 65%,aotric valve mild stenosis 1.1cm^2([facility])  . FEMORAL ARTERY STENT Right 2006, 2011, 2012   Atherectomy with PTA alone in 2006; April 2000: Right SFA - overlapping stents (6.0 mm x 170 mm x2 and 6.0 mm x 40 mm); [place] PTA of right SFA ISR -- patent by Dopplers and  cath in 2012  . ILIAC ARTERY STENT Right 2006  . lower arterial extrem. doppler  07/23/2011   ABI RIGHT .92 ,LEFT ABI .81 ,RGT PROX.SFA STENT 0-49%,RGT SFA  W/O EVIDENCE OF SIGNIFICANT DIAMETER REDUCTION,RGT POPLITEAL ARTERY 50% NARROWING   . LOWER EXTREMITY ANGIOGRAM Left 07/04/2011   Procedure: LOWER EXTREMITY ANGIOGRAM;  Surgeon: Lorretta Harp, MD;  Location[facility] C[facility] LAB;  Service: Cardiovascular;  Laterality: Left;  . NM MYOCAR PERF WALL MOTION  10/13/2009   EF 73%  . POPLITEAL ARTERY ANGIOPLASTY  July 04, 2011   Done in the same setting as PTA of ISR in right SFA  . RENAL ARTERY STENT Bilateral 2006   Dr. Deon Pilling; patent as of 2012 cath  . renal doppler  11/13/2011   bil.renal arterial stents patent w/less than 60%,right and left kidneys equal in size  . TRANSTHORACIC ECHOCARDIOGRAM  2/'16; 10/17/2015   a.Vigorous LV function, EF 60-70%. Dynamic outflow tract obstruction. Midcavity obliteration. Peak intracavitary gradient 36 mmHg, moderate AS, Moderate-severely Ca++ MV -> mild MS. b. EF 65-70%, no RWMA. Gr 1 DD. Vigorous - dynamic outflow gradient (4.9 m/s). Mod AS (mean Grad 21 mmHg), Mild MS, Mod TR - PAP ~58 mmHg    ROS as in subjective   Objective: BP 120/64   Pulse 69   Resp 16   Ht 5' 3.5" (1.613 m)   Wt 139 lb 12.8 oz (63.4 kg)   SpO2 96%   BMI 24.38 kg/m   General appearance: alert, no distress, WD/WN,  HEENT: normocephalic, sclerae anicteric, TMs pearly, nares patent, no discharge or erythema, pharynx normal Oral cavity: MMM, no lesions Neck: supple, no lymphadenopathy, no thyromegaly, no masses Heart: RRR, normal S1, S2, no murmurs Lungs: CTA bilaterally, no wheezes, rhonchi, or rales Abdomen: +bs, soft, non tender, non distended, no masses, no hepatomegaly, no splenomegaly Pulses: 2+ symmetric, upper and lower extremities, normal cap refill Ext: no edema    Assessment: Encounter Diagnoses  Name Primary?  . CKD (chronic kidney disease) stage 3, GFR  30-59 ml/min (HCC) Yes  . PAD (peripheral artery disease) ([place])   . Essential hypertension   . Aortic valve stenosis, senile calcific   . Coronary artery disease involving native coronary artery of native heart with angina pectoris ([place])   . Dyslipidemia, goal LDL below 70   . History of MI (myocardial infarction)   . Anxiety   . Caregiver burden   . Left carotid stenosis   . Left upper quadrant pain       Plan: Labs today.  Medications reviewed today including her pill bottles that she had with her.  Everything checked  out correctly.  Glad to hear her mood and anxiety is improving glad to hear her relationship with her granddaughter has improved.  Pending labs consider abdominal imaging since she continues to have left upper quadrant abdominal pain  Otherwise she seems to be doing well   Aortic stenosis-I reviewed her April 2018 cardiology notes.  Advise repeat echocardiogram in 2019.  So she is due for follow-up with cardiology.  CKD-labs today, she has history of bilateral renal artery stents, reviewed last renal ultrasound from 10/2014.  Continue Fluoxetine.  Counseled on stress reduction, having boundaries and house rules at home for grand daughter.  F/u 3- 48mo  Tashia was seen today for med check.  Diagnoses and all orders for this visit:  CKD (chronic kidney disease) stage 3, GFR 30-59 ml/min (HCC) -     Basic metabolic panel  PAD (peripheral artery disease) (HCC)  Essential hypertension -     Hemoglobin A1c -     Lipid panel  Aortic valve stenosis, senile calcific  Coronary artery disease involving native coronary artery of native heart with angina pectoris (HCC)  Dyslipidemia, goal LDL below 70 -     Lipid panel  History of MI (myocardial infarction)  Anxiety  Caregiver burden  Left carotid stenosis  Left upper quadrant pain -     Lipase

## 2018-02-18 ENCOUNTER — Other Ambulatory Visit: Payer: Self-pay | Admitting: Medical

## 2018-02-18 ENCOUNTER — Telehealth: Payer: Self-pay | Admitting: Medical

## 2018-02-18 DIAGNOSIS — I10 Essential (primary) hypertension: Secondary | ICD-10-CM

## 2018-02-18 LAB — BASIC METABOLIC PANEL
BUN/Creatinine Ratio: 14 (ref 12–28)
BUN: 27 mg/dL (ref 10–36)
CALCIUM: 10.7 mg/dL — AB (ref 8.7–10.3)
CHLORIDE: 100 mmol/L (ref 96–106)
CO2: 26 mmol/L (ref 20–29)
Creatinine, Ser: 1.87 mg/dL — ABNORMAL HIGH (ref 0.57–1.00)
GFR calc non Af Amer: 23 mL/min/{1.73_m2} — ABNORMAL LOW (ref >59)
GFR, EST AFRICAN AMERICAN: 27 mL/min/{1.73_m2} — AB (ref >59)
Glucose: 159 mg/dL — ABNORMAL HIGH (ref 65–99)
POTASSIUM: 3.4 mmol/L — AB (ref 3.5–5.2)
Sodium: 141 mmol/L (ref 134–144)

## 2018-02-18 LAB — LIPID PANEL
Chol/HDL Ratio: 4 ratio (ref 0.0–4.4)
Cholesterol, Total: 182 mg/dL (ref 100–199)
HDL: 46 mg/dL (ref >39)
LDL Calculated: 109 mg/dL — ABNORMAL HIGH (ref 0–99)
TRIGLYCERIDES: 134 mg/dL (ref 0–149)
VLDL CHOLESTEROL CAL: 27 mg/dL (ref 5–40)

## 2018-02-18 LAB — HEMOGLOBIN A1C
ESTIMATED AVERAGE GLUCOSE: 117 mg/dL
HEMOGLOBIN A1C: 5.7 % — AB (ref 4.8–5.6)

## 2018-02-18 MED ORDER — FLUOXETINE HCL 10 MG PO CAPS: ORAL_CAPSULE | ORAL | 2 refills | Status: DC

## 2018-02-18 MED ORDER — POTASSIUM CHLORIDE CRYS ER 20 MEQ PO TBCR: 20.0000 meq | EXTENDED_RELEASE_TABLET | Freq: Two times a day (BID) | ORAL | 2 refills | Status: DC

## 2018-02-18 MED ORDER — ROSUVASTATIN CALCIUM 20 MG PO TABS: 20.0000 mg | ORAL_TABLET | Freq: Every day | ORAL | 0 refills | Status: DC

## 2018-02-18 MED ORDER — ASPIRIN 81 MG PO TBEC: 81.0000 mg | DELAYED_RELEASE_TABLET | Freq: Every day | ORAL | 3 refills | Status: AC

## 2018-02-18 NOTE — Telephone Encounter (Signed)
  Please call  Patient called about potassium rx, states she told you that She can't swallow those big pills and now she has Them again and was told to take them twice a day this time  Please advise

## 2018-02-19 ENCOUNTER — Other Ambulatory Visit: Payer: Self-pay | Admitting: Medical

## 2018-02-19 ENCOUNTER — Encounter: Payer: Self-pay | Admitting: Podiatry

## 2018-02-19 ENCOUNTER — Ambulatory Visit: Payer: Medicare Other | Admitting: Podiatry

## 2018-02-19 DIAGNOSIS — M79676 Pain in unspecified toe(s): Secondary | ICD-10-CM | POA: Diagnosis not present

## 2018-02-19 DIAGNOSIS — B351 Tinea unguium: Secondary | ICD-10-CM | POA: Diagnosis not present

## 2018-02-19 DIAGNOSIS — R1084 Generalized abdominal pain: Secondary | ICD-10-CM

## 2018-02-19 DIAGNOSIS — E1151 Type 2 diabetes mellitus with diabetic peripheral angiopathy without gangrene: Secondary | ICD-10-CM

## 2018-02-19 DIAGNOSIS — D689 Coagulation defect, unspecified: Secondary | ICD-10-CM

## 2018-02-19 MED ORDER — POTASSIUM CHLORIDE 20 MEQ/15ML (10%) PO SOLN: 20.0000 meq | Freq: Two times a day (BID) | ORAL | 2 refills | Status: DC

## 2018-02-19 NOTE — Telephone Encounter (Signed)
I apologize.  I sent liquid to try instead.

## 2018-02-19 NOTE — Telephone Encounter (Signed)
Left message on voicemail for patient to call back. 

## 2018-02-22 NOTE — Progress Notes (Signed)
She presents today with chief complaint of pain to the fourth toe right foot.  States that it feels like there is an ingrown toenail in the skin.  She states that she takes blood thinner.  Objective: Vital signs are stable she is alert and oriented x3 toenails are long thick yellow dystrophic clinically mycotic sharply incurvated.  No open lesions or wounds to the foot.  Assessment pain in limb secondary to long thick yellow dystrophic-like mycotic painful nails.  Plan: Debridement of nails 1 through 5 bilateral cover service secondary to blood thinner and pain.

## 2018-02-23 NOTE — Telephone Encounter (Signed)
Patient states that the liquid medication was working.   Patient notified of CT Scan appt information.  March 09 2018 at 140 for 2pm appt at [facility].

## 2018-02-24 ENCOUNTER — Other Ambulatory Visit: Payer: Self-pay

## 2018-02-24 DIAGNOSIS — I6522 Occlusion and stenosis of left carotid artery: Secondary | ICD-10-CM

## 2018-02-24 DIAGNOSIS — Z8673 Personal history of transient ischemic attack (TIA), and cerebral infarction without residual deficits: Secondary | ICD-10-CM

## 2018-02-24 DIAGNOSIS — I6521 Occlusion and stenosis of right carotid artery: Secondary | ICD-10-CM

## 2018-03-02 ENCOUNTER — Encounter (HOSPITAL_COMMUNITY): Payer: Medicare Other

## 2018-03-02 ENCOUNTER — Ambulatory Visit: Payer: Medicare Other | Admitting: Family

## 2018-03-02 ENCOUNTER — Other Ambulatory Visit: Payer: Self-pay | Admitting: Cardiology

## 2018-03-02 NOTE — Telephone Encounter (Signed)
Rx sent to pharmacy   

## 2018-03-08 ENCOUNTER — Other Ambulatory Visit: Payer: Self-pay | Admitting: Cardiology

## 2018-03-09 ENCOUNTER — Inpatient Hospital Stay: Admission: RE | Admit: 2018-03-09 | Payer: Medicare Other | Source: Ambulatory Visit

## 2018-03-11 ENCOUNTER — Other Ambulatory Visit: Payer: Self-pay | Admitting: Cardiology

## 2018-03-16 ENCOUNTER — Other Ambulatory Visit: Payer: Self-pay | Admitting: Cardiology

## 2018-03-17 ENCOUNTER — Other Ambulatory Visit: Payer: Self-pay | Admitting: Medical

## 2018-03-17 NOTE — Telephone Encounter (Signed)
Is this ok to refill?  

## 2018-03-30 ENCOUNTER — Telehealth: Payer: Self-pay | Admitting: Cardiology

## 2018-03-30 NOTE — Telephone Encounter (Signed)
New message   Patient calling ,states she has stomach pain every night, stating medications prescribed by Dr Ellyn Hack could be the problem. No chest pain Not SOB

## 2018-03-30 NOTE — Telephone Encounter (Signed)
S/w patient.  Patient states that she has been have lower abdominal almost every night for a month. Patient states that she was having episodes of loose bowels that lasted a couple of months that has now resolved. Patient denies blood in stool or dark tarry stools. She has also had reoccurring yeast infections that have been treated by her pcp.  Patinet denies any cardiac symptoms, no chest pain, sob, syncope, swelling. Her pcp was going to refer her to GI but she cancelled because she was not feeling well.  Adv pt that I do not believe her complaints are related to her cardiac medications. Adv her to contact her pcp to make them aware and place another ref to GI as her pcp recommending.  Pt aware of her appt with our office sch for 8/28 @ 1:30pm. Pt sts that she will keep that appt.  she will call the office if cardiac symptoms develop.

## 2018-04-09 ENCOUNTER — Ambulatory Visit
Admission: RE | Admit: 2018-04-09 | Discharge: 2018-04-09 | Disposition: A | Discharge status: Home / Self Care | Payer: Medicare Other | Source: Ambulatory Visit | Attending: Medical | Admitting: Medical

## 2018-04-09 ENCOUNTER — Other Ambulatory Visit: Payer: Self-pay | Admitting: Cardiology

## 2018-04-09 DIAGNOSIS — R1084 Generalized abdominal pain: Secondary | ICD-10-CM

## 2018-04-09 DIAGNOSIS — N200 Calculus of kidney: Secondary | ICD-10-CM | POA: Diagnosis not present

## 2018-04-13 ENCOUNTER — Other Ambulatory Visit: Payer: Self-pay | Admitting: Medical

## 2018-04-13 ENCOUNTER — Ambulatory Visit (INDEPENDENT_AMBULATORY_CARE_PROVIDER_SITE_OTHER): Payer: Medicare Other | Admitting: Medical

## 2018-04-13 VITALS — BP 120/74 | HR 70 | Temp 97.9°F | Resp 16 | Ht 63.0 in | Wt 139.6 lb

## 2018-04-13 DIAGNOSIS — K8020 Calculus of gallbladder without cholecystitis without obstruction: Secondary | ICD-10-CM

## 2018-04-13 DIAGNOSIS — R1012 Left upper quadrant pain: Secondary | ICD-10-CM

## 2018-04-13 DIAGNOSIS — N200 Calculus of kidney: Secondary | ICD-10-CM | POA: Diagnosis not present

## 2018-04-13 DIAGNOSIS — Z636 Dependent relative needing care at home: Secondary | ICD-10-CM | POA: Diagnosis not present

## 2018-04-13 DIAGNOSIS — I350 Nonrheumatic aortic (valve) stenosis: Secondary | ICD-10-CM | POA: Diagnosis not present

## 2018-04-13 DIAGNOSIS — I700 Atherosclerosis of aorta: Secondary | ICD-10-CM

## 2018-04-13 DIAGNOSIS — K449 Diaphragmatic hernia without obstruction or gangrene: Secondary | ICD-10-CM

## 2018-04-13 DIAGNOSIS — R109 Unspecified abdominal pain: Secondary | ICD-10-CM | POA: Insufficient documentation

## 2018-04-13 MED ORDER — PANTOPRAZOLE SODIUM 40 MG PO TBEC
DELAYED_RELEASE_TABLET | ORAL | 0 refills | Status: DC
Start: 2018-04-13 — End: 2018-04-13

## 2018-04-13 NOTE — Progress Notes (Signed)
Subjective:  [name] is a 84 y.o. female who presents for Chief Complaint  Patient presents with  . discuss CT results    Discuss CT results with provider     Here for f/u on recent CT results and abdominal pain.  I had seen her a few months ago for abdominal pain.  We ordered a CT results which she just got done recently.  She is here to discuss.  She notes that she should still gets hunger pains or pains in her abdomen every morning.  She does not get pain other times a day just in the mornings.  She notes that she can awake at 5 or 6 AM until hungry, pain in her belly but after eating this improves.  She has a daily bowel movement, denies constipation or diarrhea.  Denies urinary concern.  No blood in the stool, no hematemesis.  No fevers, no weight loss.  Sometimes feels hungry often.  She sometimes eats as late as 9-10 o'clock.  She does note she can do better with drinking less soda and other fried food  Sees cardiology soon, hx/o valve disease, HTN  No other aggravating or relieving factors.    No other c/o.  The following portions of the patient's history were reviewed and updated as appropriate: allergies, current medications, past family history, past medical history, past social history, past surgical history and problem list.  ROS Otherwise as in subjective above  Objective: BP 120/74   Pulse 70   Temp 97.9 F (36.6 C) (Oral)   Resp 16   Ht 5\' 3"  (1.6 m)   Wt 139 lb 9.6 oz (63.3 kg)   SpO2 94%   BMI 24.73 kg/m   General appearance: alert, no distress, well developed, well nourished Oral cavity: MMM, no lesions Neck: supple, no lymphadenopathy, no thyromegaly, no masses Heart: 3/6 holosystolic murmur heard best in left upper sternal border, otherwise regular rate and rhythm Lungs: CTA bilaterally, no wheezes, rhonchi, or rales Abdomen: +bs, soft, non tender, non distended, no masses, no hepatomegaly, no splenomegaly Pulses: 1+ radial pulses, 2+ pedal  pulses, normal cap refill Ext: no edema   Assessment: Encounter Diagnoses  Name Primary?  . Left upper quadrant pain Yes  . Caregiver burden   . Aortic valve stenosis, senile calcific   . Aortic atherosclerosis ([place])   . Renal calculi   . Hiatal hernia   . Calculus of gallbladder without cholecystitis without obstruction      Plan: I reviewed her CT abdomen and pelvis from 04/09/2018 showing mild sliding hiatal hernia, gallstones, nonobstructive left renal calculus, and aortic atherosclerosis.  Abdominal pain-begin trial of protonix, advised diet GERD avoidance, and call report 2-3 weeks.   She may have ulcer.  She was having a lot of stress with home life watching her teenage granddaughter months ago but things have stabilized in the home now.  If not much improved in the coming weeks, refer to GI.  Reviewed other findings on CT including atherosclerosis, renal calculi, hiatal hernia.  C/t current medications including statin, antihypertensives, aspirin.  Thea was seen today for discuss ct results.  Diagnoses and all orders for this visit:  Left upper quadrant pain  Caregiver burden  Aortic valve stenosis, senile calcific  Aortic atherosclerosis (HCC)  Renal calculi  Hiatal hernia  Calculus of gallbladder without cholecystitis without obstruction  Other orders -     pantoprazole (PROTONIX) 40 MG tablet; 1 tablet daily po 45 min prior to breakfast  Follow up: with call report 2-3 weeks

## 2018-04-13 NOTE — Patient Instructions (Addendum)
Encounter Diagnoses  Name Primary?  . Left upper quadrant pain Yes  . Caregiver burden   . Aortic valve stenosis, senile calcific   . Aortic atherosclerosis ([place])   . Renal calculi   . Hiatal hernia   . Calculus of gallbladder without cholecystitis without obstruction     Recommendations  Avoid or limit foods that may worsen gas or acid reflux  For example, limit or avoid spicy foods, acidic foods, citrus such as oranges or grapefruits, lemonade, tomato-based foods such as pizza sauce or salsa, hot salts, peppers, onions, fried food, junk food, soda  Avoid eating within an hour and a half of bedtime  Consider beginning a medication called Protonix once daily 45 minutes before breakfast.   Take this for 2 weeks and see if the pain does not go away  Limit or avoid over-the-counter anti-inflammatory such as ibuprofen Aleve Motrin or BC powders as they can worsen reflux and ulcer but also can worsen your kidney function  If your symptoms do not improve in the next 3 to 4 weeks, you may need to see a gastroenterologist  Your recent CT results showed a kidney stone that is likely not causing any problems right now  Your study also showed a hiatal hernia that may or may not be playing a role  Follow-up soon with your heart doctor as planned for routine follow-up

## 2018-04-16 ENCOUNTER — Ambulatory Visit: Payer: Medicare Other | Admitting: Podiatry

## 2018-04-16 ENCOUNTER — Encounter: Payer: Self-pay | Admitting: Podiatry

## 2018-04-16 DIAGNOSIS — E1151 Type 2 diabetes mellitus with diabetic peripheral angiopathy without gangrene: Secondary | ICD-10-CM

## 2018-04-16 DIAGNOSIS — B351 Tinea unguium: Secondary | ICD-10-CM

## 2018-04-16 DIAGNOSIS — M79676 Pain in unspecified toe(s): Secondary | ICD-10-CM

## 2018-04-16 DIAGNOSIS — D689 Coagulation defect, unspecified: Secondary | ICD-10-CM | POA: Diagnosis not present

## 2018-04-16 NOTE — Progress Notes (Signed)
She presents today concerned about fourth toe of the right foot stating that the lateral border was hurting because her granddaughter stepped on the nail trims her nails got a little close and he has some hard skin on it.  She says that hurts and is painful once the skin grows back.  Objective: Vital signs are stable she is alert and oriented x3 pulses are nonpalpable.  Neurologic sensorium is intact deep tendon reflexes are intact muscle strength is normal symmetrical.  Cutaneous evaluation demonstrates reactive hyperkeratosis with sharp incurvated nail margin along the fibular border fourth digit of the right foot secondary to its orientation which is a mild adductovarus rotation.  No open lesions or wounds are noted.  Assessment: Peripheral vascular disease with reactive hyperkeratosis fourth digit of the right foot.  Plan: We are requesting vascular evaluation to make sure that she is not any danger of losing anything.  And I debrided the area today and she will follow-up with [place] on an as-needed basis for debridement.

## 2018-04-21 ENCOUNTER — Ambulatory Visit: Payer: Medicare Other | Admitting: Podiatry

## 2018-04-21 NOTE — Progress Notes (Signed)
Cardiology Office Note   Date:  04/22/2018   ID:  [name], DOB 02-03-1924, MRN [id number]  PCP:  Denita Lung, MD  Cardiologist:  Dr. Ellyn Hack Chief Complaint  Patient presents with  . Follow-up    1 year. denies chest pains, swelling in hands/feet, mentions SOB with activites      History of Present Illness: [name] is a 84 y.o. female who presents for ongoing assessment and management of CAD (PCI with BMS to the RCA 2007, distal stent stenosis in RCA treated with BMS distally, Cx and OM lesion treated with BMS),, AoV stenosis, dyslipidemia, DVT, cerebral aneurysm without rupture, CVA, bilateral RAS. Was last seen by Dr. Ellyn Hack on 11/25/2016. She is due for repeat echocardiogram to assess AoV stenosis. Recent myoview in 2011 negative for ischemia.    She has been having increasing fatigue. She states that her energy is worsening. She has to really lean on grocery cart when she is walking through the store. She has some mild dyspnea.   Past Medical History:  Diagnosis Date  . Anemia   . Aortic valve stenosis, senile calcific January 2011; Jan 2017   a. Echo: Normal EF 60-65%, mild aortic stenosis - estimated valve area 1.1 cm ; b. Feb '17: EF 65-70%, no RWMA. Gr 1 DD. Vigorous - dynamic outflow gradient (4.9 m/s). Mod AS (mean Grad 21 mmHg), Mild MS, Mod TR - PAP ~58 mmHg   . Arthritis   . Bilateral renal artery stenosis ([place]) 2006 ([place])   - Right renal artery 60% stenosis March '13  . Carotid artery occlusion   . Cerebral aneurysm without rupture    3 mm; followed by Dr. Arnoldo Morale as well as vascular surgery  . Chronic kidney disease 1964  . Coronary artery disease - history of MI October 2006   Severe RCA lesion -- 2.5 Mm x 12 Mm Vision BMS; January 2007 -. Distal stent stenosis in RCA treated with 2.5 mm x y 12 mm Vision BMS stent distally;  circumflex/OM stenosis -- 2.5 mg 12 mm Mini Vision BMS ;  most recent cath October 11: 100% RCA occlusion with ISR. Left  right collaterals. Patent to the extent, the 70% AV groove circumflex. Apical LAD stenosis 70-80 %, diffuse disease; EF 65-70%   . DVT (deep venous thrombosis) ([place])   . Dyslipidemia, goal LDL below 70     no longer on statin  . Fall at home Jan. 22, 2016  . Hypertension    Previously on multiple medications now on only clonidine  . Non-ST elevation MI (NSTEMI) ([place]) 2006, 2011  . Ovarian cancer ([place]) 1963  . Peripheral vascular disease ([place]) 2010   Known distal left SFA occlusion, known right posterior tibial artery occlusion as well as left posterior and anterior tibial artery occlusion.; Most recent Dopplers November 2012: Patent right SFA stent, left ABI 0.56  . Stroke [facility]) Feb. 2015   Carotid artery Doppler show right internal carotid 60-80% stenosis. Previously followed by Dr. Donzetta Kohut    Past Surgical History:  Procedure Laterality Date  . ABDOMINAL HYSTERECTOMY    . APPENDECTOMY    . BLADDER SURGERY     bladder tack  . BREAST LUMPECTOMY Left   . CARDIAC CATHETERIZATION  05/28/2010   EF 65-70%,multi vessel CAD  . CAROTID DOPPLER  02/04/2012; January 2015   Done at [facility]----right internal 60 to 79%,bilateral external patent,bilateral common carotid artery tortuosity present  . CORONARY ANGIOPLASTY WITH STENT PLACEMENT  05/27/2005  RCA - MiniVision BMS 2.5 mm x 12 mm  . CORONARY ANGIOPLASTY WITH STENT PLACEMENT Bilateral 01/23/20072007   Circumflex-OM -- 2.5 mm x 12 mm Mini Vision BMS; RCA distal to previous stent 2.5 mm x 12 mm Mini Vision BMS  . DOPPLER ECHOCARDIOGRAPHY  09/25/2009   ef 60 to 65%,aotric valve mild stenosis 1.1cm^2([facility])  . FEMORAL ARTERY STENT Right 2006, 2011, 2012   Atherectomy with PTA alone in 2006; April 2000: Right SFA - overlapping stents (6.0 mm x 170 mm x2 and 6.0 mm x 40 mm); Cutting Balloon PTA of right SFA ISR -- patent by Dopplers and cath in 2012  . ILIAC ARTERY STENT Right 2006  . lower arterial extrem. doppler  07/23/2011   ABI RIGHT .92 ,LEFT  ABI .46 ,RGT PROX.SFA STENT 0-49%,RGT SFA  W/O EVIDENCE OF SIGNIFICANT DIAMETER REDUCTION,RGT POPLITEAL ARTERY 50% NARROWING   . LOWER EXTREMITY ANGIOGRAM Left 07/04/2011   Procedure: LOWER EXTREMITY ANGIOGRAM;  Surgeon: Lorretta Harp, MD;  Location: [facility] CATH LAB;  Service: Cardiovascular;  Laterality: Left;  . NM MYOCAR PERF WALL MOTION  10/13/2009   EF 73%  . POPLITEAL ARTERY ANGIOPLASTY  July 04, 2011   Done in the same setting as PTA of ISR in right SFA  . RENAL ARTERY STENT Bilateral 2006   Dr. Deon Pilling; patent as of 2012 cath  . renal doppler  11/13/2011   bil.renal arterial stents patent w/less than 60%,right and left kidneys equal in size  . TRANSTHORACIC ECHOCARDIOGRAM  2/'16; 10/17/2015   a.Vigorous LV function, EF 60-70%. Dynamic outflow tract obstruction. Midcavity obliteration. Peak intracavitary gradient 36 mmHg, moderate AS, Moderate-severely Ca++ MV -> mild MS. b. EF 65-70%, no RWMA. Gr 1 DD. Vigorous - dynamic outflow gradient (4.9 m/s). Mod AS (mean Grad 21 mmHg), Mild MS, Mod TR - PAP ~58 mmHg      Current Outpatient Medications  Medication Sig Dispense Refill  . acetaminophen (TYLENOL) 500 MG tablet Take 500 mg by mouth every 6 (six) hours as needed for mild pain, moderate pain or headache.     Marland Kitchen amLODipine (NORVASC) 5 MG tablet Take 1 tablet (5 mg total) by mouth daily. Make OV for further refills. 90 tablet 0  . aspirin 81 MG EC tablet Take 1 tablet (81 mg total) by mouth daily. Swallow whole. 90 tablet 3  . atorvastatin (LIPITOR) 20 MG tablet TAKE 1 TABLET BY MOUTH ONCE DAILY 90 tablet 0  . carvedilol (COREG) 6.25 MG tablet TAKE 1 TABLET BY MOUTH TWICE DAILY WITH A MEAL(MUST KEEP OFFICE VISIT) 180 tablet 1  . chlorthalidone (HYGROTON) 25 MG tablet TAKE 1/2 TABLET BY MOUTH EVERY DAY 45 tablet 2  . clopidogrel (PLAVIX) 75 MG tablet Take 1 tablet (75 mg total) by mouth daily. Please keep upcoming appointment for further refills. 30 tablet 0  . FLUoxetine (PROZAC) 10 MG  capsule TAKE 1 CAPSULE(10 MG) BY MOUTH DAILY 30 capsule 2  . OVER THE COUNTER MEDICATION Take 1 capsule by mouth daily as needed (constipation). STOOL SOFTNER    . pantoprazole (PROTONIX) 40 MG tablet TAKE 1 TABLET BY MOUTH DAILY 45 MINUTES BEFORE BREAKFAST 90 tablet 0  . potassium chloride SA (K-DUR,KLOR-CON) 20 MEQ tablet TAKE 1 TABLET(20 MEQ) BY MOUTH TWICE DAILY 180 tablet 2  . Propylene Glycol-Glycerin (SOOTHE OP) Place 1 drop into both eyes 2 (two) times daily.     . rosuvastatin (CRESTOR) 20 MG tablet Take 1 tablet (20 mg total) by mouth at bedtime. 90 tablet  0   No current facility-administered medications for this visit.     Allergies:   Codeine    Social History:  The patient  reports that she has never smoked. She has never used smokeless tobacco. She reports that she does not drink alcohol or use drugs.   Family History:  The patient's family history includes Alcoholism in her brother and sister; Alzheimer's disease in her sister; Heart attack in her daughter and father; Irritable bowel syndrome in her daughter; Ovarian cancer in her mother.    ROS: All other systems are reviewed and negative. Unless otherwise mentioned in H&P    PHYSICAL EXAM: VS:  BP 134/78 (BP Location: Left Arm, Patient Position: Sitting)   Pulse (!) 55   Ht _0  (1.575 m)   Wt 138 lb 6.4 oz (62.8 kg)   BMI 25.31 kg/m  , BMI Body mass index is 25.31 kg/m. GEN: Well nourished, well developed, in no acute distress  HEENT: normal  Neck: no JVD, carotid bruits, or masses Cardiac: RRR, bradycardic, no murmurs, rubs, or gallops,no edema  Respiratory:  clear to auscultation bilaterally, normal work of breathing GI: soft, nontender, nondistended, + BS MS: no deformity or atrophy  Skin: warm and dry, no rash Neuro:  Strength and sensation are intact Psych: euthymic mood, full affect   EKG: Sinus bradycardia with 2nd degree AV block, (Mobitz II). Nonspecific T-wave abnormality.   11/26/2017: ALT 7;  Hemoglobin 11.5; Platelets 200 02/17/2018: BUN 27; Creatinine, Ser 1.87; Potassium 3.4; Sodium 141    Lipid Panel    Component Value Date/Time   CHOL 182 02/17/2018 1148   TRIG 134 02/17/2018 1148   HDL 46 02/17/2018 1148   CHOLHDL 4.0 02/17/2018 1148   CHOLHDL 4.9 11/28/2016 1616   VLDL 38 (H) 11/28/2016 1616   LDLCALC 109 (H) 02/17/2018 1148      Wt Readings from Last 3 Encounters:  04/22/18 138 lb 6.4 oz (62.8 kg)  04/13/18 139 lb 9.6 oz (63.3 kg)  02/17/18 139 lb 12.8 oz (63.4 kg)      Other studies Reviewed: Echocardiogram 11-02-15   Left ventricle: The cavity size was normal. There was mild focal   basal hypertrophy of the septum. Systolic function was vigorous.   The estimated ejection fraction was in the range of 65% to 70%.   Wall motion was normal; there were no regional wall motion   abnormalities. Doppler parameters are consistent with abnormal   left ventricular relaxation (grade 1 diastolic dysfunction).   Doppler parameters are consistent with high ventricular filling   pressure. - Aortic valve: Valve mobility was restricted. There was moderate   stenosis. There was trivial regurgitation. - Mitral valve: Calcified annulus. The findings are consistent with   mild stenosis. There was mild regurgitation. - Left atrium: The atrium was mildly dilated. - Tricuspid valve: There was moderate regurgitation. - Pulmonary arteries: Systolic pressure was moderately to severely   increased. PA peak pressure: 58 mm Hg (S).  Impressions:  - Vigorous LV function; intracavitary gradient of 4.9 m/s with   valsalva; grade 1 diastolic dysfunction with elevated LV filling   pressure; calcified aortic valve with moderate AS (mean gradient   31 mmHg) and trace AI; MAC with mild MS; mild LAE; moderate TR   with moderate to severe elevation in pulmonary pressure  ASSESSMENT AND PLAN:  1.  Bradycardia with 2nd degree AV block: This is new compared to previous EKG. This may  be contributing to her symptoms. I  will stop the coreg 6.25 mg BID, but have very close follow up to check BP response to this. She will have a ZIO XL cardiac monitor placed for 14 days. This has been explained to the patient. She verbalizes understanding.  2. Hypertension: She may need to have increased dose of amlodipine to keep BP controlled on the lower dose of coreg.   3. AoV stenosis: Was supposed to have echo ordered. Will do so on follow up.   Current medicines are reviewed at length with the patient today.    Labs/ tests ordered today include: Cardiac Monitor.  Phill Myron. [place] Pugh, ANP, AACC   04/22/2018 1:57 PM     [facility] [address], [place], [place] [address] Phone: [phone]; Fax: [phone]

## 2018-04-22 ENCOUNTER — Ambulatory Visit (INDEPENDENT_AMBULATORY_CARE_PROVIDER_SITE_OTHER): Payer: Medicare Other | Admitting: Adult Health

## 2018-04-22 ENCOUNTER — Encounter: Payer: Self-pay | Admitting: Adult Health

## 2018-04-22 VITALS — BP 134/78 | HR 55 | Ht 62.0 in | Wt 138.4 lb

## 2018-04-22 DIAGNOSIS — I441 Atrioventricular block, second degree: Secondary | ICD-10-CM

## 2018-04-22 DIAGNOSIS — R001 Bradycardia, unspecified: Secondary | ICD-10-CM | POA: Diagnosis not present

## 2018-04-22 DIAGNOSIS — I2510 Atherosclerotic heart disease of native coronary artery without angina pectoris: Secondary | ICD-10-CM | POA: Diagnosis not present

## 2018-04-22 DIAGNOSIS — I10 Essential (primary) hypertension: Secondary | ICD-10-CM | POA: Diagnosis not present

## 2018-04-22 DIAGNOSIS — I358 Other nonrheumatic aortic valve disorders: Secondary | ICD-10-CM

## 2018-04-22 NOTE — Patient Instructions (Signed)
Medication Instructions:  NO CHANGES- Your physician recommends that you continue on your current medications as directed. Please refer to the Current Medication list given to you today.  If you need a refill on your cardiac medications before your next appointment, please call your pharmacy.  Testing/Procedures: Your physician has recommended that you wear an 14 DAY ZIO-PATCH monitor. The Zio patch cardiac monitor continuously records heart rhythm data for up to 14 days, this is for patients being evaluated for multiple types heart rhythms. For the first 24 hours post application, please avoid getting the Zio monitor wet in the shower or by excessive sweating during exercise. After that, feel free to carry on with regular activities. Keep soaps and lotions away from the ZIO XT Patch.  This will be placed at our [facility] location - [address], [address].    Special Instructions: PLEASE RETURN Tuesday FOR BP CHECK @ 10AM  Follow-Up: Your physician wants you to follow-up in: [place] (NURSE PRACTIONIER), DNP,AACC IF PRIMARY CARDIOLOGIST IS UNAVAILABLE.    Thank you for choosing [facility] at [facility]!!

## 2018-04-28 ENCOUNTER — Ambulatory Visit: Payer: Medicare Other

## 2018-04-30 ENCOUNTER — Encounter (HOSPITAL_COMMUNITY): Payer: Medicare Other

## 2018-04-30 ENCOUNTER — Ambulatory Visit: Payer: Medicare Other | Admitting: Family

## 2018-05-01 ENCOUNTER — Ambulatory Visit (INDEPENDENT_AMBULATORY_CARE_PROVIDER_SITE_OTHER): Payer: Medicare Other

## 2018-05-01 DIAGNOSIS — I441 Atrioventricular block, second degree: Secondary | ICD-10-CM | POA: Diagnosis not present

## 2018-05-01 DIAGNOSIS — R001 Bradycardia, unspecified: Secondary | ICD-10-CM | POA: Diagnosis not present

## 2018-05-04 ENCOUNTER — Other Ambulatory Visit: Payer: Self-pay

## 2018-05-04 ENCOUNTER — Ambulatory Visit (HOSPITAL_COMMUNITY): Payer: Medicare Other | Attending: Cardiology

## 2018-05-04 DIAGNOSIS — E785 Hyperlipidemia, unspecified: Secondary | ICD-10-CM | POA: Insufficient documentation

## 2018-05-04 DIAGNOSIS — I272 Pulmonary hypertension, unspecified: Secondary | ICD-10-CM | POA: Insufficient documentation

## 2018-05-04 DIAGNOSIS — N189 Chronic kidney disease, unspecified: Secondary | ICD-10-CM | POA: Diagnosis not present

## 2018-05-04 DIAGNOSIS — Z86718 Personal history of other venous thrombosis and embolism: Secondary | ICD-10-CM | POA: Insufficient documentation

## 2018-05-04 DIAGNOSIS — I2510 Atherosclerotic heart disease of native coronary artery without angina pectoris: Secondary | ICD-10-CM | POA: Diagnosis not present

## 2018-05-04 DIAGNOSIS — I358 Other nonrheumatic aortic valve disorders: Secondary | ICD-10-CM | POA: Diagnosis not present

## 2018-05-04 DIAGNOSIS — Z8673 Personal history of transient ischemic attack (TIA), and cerebral infarction without residual deficits: Secondary | ICD-10-CM | POA: Diagnosis not present

## 2018-05-04 DIAGNOSIS — I350 Nonrheumatic aortic (valve) stenosis: Secondary | ICD-10-CM | POA: Insufficient documentation

## 2018-05-07 ENCOUNTER — Encounter: Payer: Self-pay | Admitting: Podiatry

## 2018-05-07 ENCOUNTER — Ambulatory Visit (INDEPENDENT_AMBULATORY_CARE_PROVIDER_SITE_OTHER): Payer: Medicare Other | Admitting: Podiatry

## 2018-05-07 ENCOUNTER — Telehealth: Payer: Self-pay | Admitting: Cardiology

## 2018-05-07 DIAGNOSIS — L600 Ingrowing nail: Secondary | ICD-10-CM | POA: Diagnosis not present

## 2018-05-07 DIAGNOSIS — I739 Peripheral vascular disease, unspecified: Secondary | ICD-10-CM

## 2018-05-07 MED ORDER — NEOMYCIN-POLYMYXIN-HC 1 % OT SOLN: OTIC | 1 refills | Status: DC

## 2018-05-07 NOTE — Patient Instructions (Signed)

## 2018-05-07 NOTE — Progress Notes (Signed)
She presents today for follow-up on the fourth toe of the right foot.  She states it is just that small corner over there that really hurts me and it keeps me awake at night bothers me when I am wearing the shoes he just hurts all the time.  Objective: Vital signs are stable she is alert and oriented x3.  Pulses are palpable.  Hammertoe deformities resulting in sharp incurvated nail margin along the fourth lateral border no purulence no malodor is noted.  Assessment ingrown toenail fibular border fourth toe right.  Plan: Discussed etiology pathology and surgical therapies at this point time performed a chemical matricectomy to the lateral border of the fourth toe.  She understood this was amenable to it tolerated procedure well with local anesthetic.  We did discuss the possibility that this may not heal because of some previous circulation problems she understood and is amenable to it and we took all precautions to make sure that this was limited and the exposure.

## 2018-05-07 NOTE — Telephone Encounter (Signed)
New Message   Patient called in stating that Dr. Hulen Skains wanted her to reach out to Dr. Ellyn Hack. She saw him today about the nail to her right toe but he told her that she does not have good blood flow to the entire leg. Please call to discuss.

## 2018-05-07 NOTE — Telephone Encounter (Signed)
Returned call to patient of Dr. Ellyn Hack. Patient saw Dr. Milinda Pointer (podiatry) today. She was told that she did not have good blood flow to her entire leg - she has had stents/balloon angioplasty to right leg. She reports this leg causes pain. She would like MD opinion on this. Her last dopplers for Jan 2018

## 2018-05-08 NOTE — Telephone Encounter (Signed)
To Dr Ellyn Hack    See DR Hyatt's note 05/07/18

## 2018-05-10 NOTE — Telephone Encounter (Signed)
She is supposed to be following up with Jory Sims, DNP soon to discuss Echo results.   Maybe we can get her in for LEA Dopplers before this visit -- if necessary, can refer back to Dr. Gwenlyn Found who did her PVD procedures many years ago (or she can see [facility] - was seen by Rema Fendt, NP for Dr. Kellie Simmering).  Glenetta Hew, MD

## 2018-05-14 ENCOUNTER — Ambulatory Visit: Payer: Medicare Other | Admitting: Podiatry

## 2018-05-14 ENCOUNTER — Telehealth: Payer: Self-pay | Admitting: Cardiology

## 2018-05-14 NOTE — Telephone Encounter (Signed)
New Message:    Patient has questions about the blood circulation to her right foot

## 2018-05-14 NOTE — Telephone Encounter (Signed)
LEFT MESSAGE FOR PATIENT TO CALL BACK --  MAYBE ABLE TO SCHEDULE LEA ON 05/22/18 IF PATIENT IS AVAILABLE

## 2018-05-14 NOTE — Telephone Encounter (Signed)
Called patient, she has appointment with [name] on 9/30, and has upcoming appointments first of October to see [facility]. Patient verbalized understanding. Did ask to have letter sent to her with all up coming appointments, letter was sent.  Will wait for upcoming appointments.

## 2018-05-15 ENCOUNTER — Telehealth: Payer: Self-pay

## 2018-05-15 NOTE — Telephone Encounter (Signed)
Pt was put of town and took pill form of potassium due to leaving liquid at home. Pt was advise she should have no issues changing back to liquid form. Pt has stated that she was told she has little to no blood flow to her great toe. Pt says if she does not get info from Cardiologist she will contact our office to have a consult with Dr. Redmond School. [place]

## 2018-05-16 ENCOUNTER — Other Ambulatory Visit: Payer: Self-pay | Admitting: Medical

## 2018-05-19 DIAGNOSIS — R001 Bradycardia, unspecified: Secondary | ICD-10-CM | POA: Diagnosis not present

## 2018-05-19 DIAGNOSIS — I441 Atrioventricular block, second degree: Secondary | ICD-10-CM | POA: Diagnosis not present

## 2018-05-20 ENCOUNTER — Telehealth: Payer: Self-pay | Admitting: Family Medicine

## 2018-05-20 ENCOUNTER — Other Ambulatory Visit: Payer: Self-pay | Admitting: Family Medicine

## 2018-05-20 NOTE — Telephone Encounter (Signed)
Called pharmacy and D/C potassium pills.

## 2018-05-20 NOTE — Telephone Encounter (Signed)
Pt called and states we keep ordering the Potassium pills.  She only uses the liquid.  I have documented under medications.

## 2018-05-20 NOTE — Telephone Encounter (Signed)
Please call pharmacy to discontinue any oral tablet potassium as she is using liquid but still getting auto refill or somehow getting tablet refills

## 2018-05-21 ENCOUNTER — Ambulatory Visit (INDEPENDENT_AMBULATORY_CARE_PROVIDER_SITE_OTHER): Payer: Self-pay

## 2018-05-21 DIAGNOSIS — L600 Ingrowing nail: Secondary | ICD-10-CM

## 2018-05-21 NOTE — Patient Instructions (Signed)

## 2018-05-22 NOTE — Progress Notes (Signed)
Patient is here today for follow-up appointment, recent procedure performed on 05/07/2018, removal of ingrown nail border fibular border fourth right toe.  She states that she is not having any pain with the toe, and has no complaints at this time.  No erythema, no redness, no drainage no swelling, no other signs and symptoms of infection.  Overall area appears to be healing well, there is a very small blister at the base of the nailbed that appears to be a reaction from the phenol.  The other areas are beginning to scab over, and are healing well.  We discussed signs and symptoms of infection importance of reporting symptoms immediately.  Verbal and written instructions were given to patient.  They are to follow-up with any acute symptom changes.

## 2018-05-25 ENCOUNTER — Ambulatory Visit: Payer: Medicare Other | Admitting: Adult Health

## 2018-05-26 NOTE — Telephone Encounter (Signed)
SEE NOTE 05/14/18

## 2018-05-29 ENCOUNTER — Other Ambulatory Visit: Payer: Self-pay | Admitting: Cardiology

## 2018-05-29 ENCOUNTER — Other Ambulatory Visit: Payer: Self-pay | Admitting: Adult Health

## 2018-05-29 ENCOUNTER — Ambulatory Visit: Payer: Medicare Other | Admitting: Podiatry

## 2018-05-29 DIAGNOSIS — I739 Peripheral vascular disease, unspecified: Secondary | ICD-10-CM

## 2018-05-29 DIAGNOSIS — Z95820 Peripheral vascular angioplasty status with implants and grafts: Secondary | ICD-10-CM

## 2018-05-30 ENCOUNTER — Other Ambulatory Visit: Payer: Self-pay | Admitting: Medical

## 2018-06-03 ENCOUNTER — Other Ambulatory Visit: Payer: Self-pay | Admitting: Medical

## 2018-06-03 ENCOUNTER — Ambulatory Visit (HOSPITAL_COMMUNITY)
Admission: RE | Admit: 2018-06-03 | Discharge: 2018-06-03 | Disposition: A | Discharge status: Home / Self Care | Payer: Medicare Other | Source: Ambulatory Visit | Attending: Internal Medicine | Admitting: Internal Medicine

## 2018-06-03 ENCOUNTER — Ambulatory Visit (INDEPENDENT_AMBULATORY_CARE_PROVIDER_SITE_OTHER): Payer: Medicare Other | Admitting: Adult Health

## 2018-06-03 ENCOUNTER — Encounter: Payer: Self-pay | Admitting: Adult Health

## 2018-06-03 ENCOUNTER — Ambulatory Visit (HOSPITAL_BASED_OUTPATIENT_CLINIC_OR_DEPARTMENT_OTHER)
Admission: RE | Admit: 2018-06-03 | Discharge: 2018-06-03 | Disposition: A | Discharge status: Home / Self Care | Payer: Medicare Other | Source: Ambulatory Visit | Attending: Internal Medicine | Admitting: Internal Medicine

## 2018-06-03 VITALS — BP 149/82 | HR 71 | Ht 63.0 in | Wt 137.6 lb

## 2018-06-03 DIAGNOSIS — I10 Essential (primary) hypertension: Secondary | ICD-10-CM

## 2018-06-03 DIAGNOSIS — I739 Peripheral vascular disease, unspecified: Secondary | ICD-10-CM

## 2018-06-03 DIAGNOSIS — I471 Supraventricular tachycardia: Secondary | ICD-10-CM | POA: Diagnosis not present

## 2018-06-03 DIAGNOSIS — Z95820 Peripheral vascular angioplasty status with implants and grafts: Secondary | ICD-10-CM | POA: Insufficient documentation

## 2018-06-03 DIAGNOSIS — I358 Other nonrheumatic aortic valve disorders: Secondary | ICD-10-CM

## 2018-06-03 MED ORDER — CHLORTHALIDONE 25 MG PO TABS: 25.0000 mg | ORAL_TABLET | Freq: Every day | ORAL | 2 refills | Status: DC

## 2018-06-03 NOTE — Patient Instructions (Signed)
Medication Instructions:  INCREASE CHLORTHALIDONE 25MG  DAILY  If you need a refill on your cardiac medications before your next appointment, please call your pharmacy.  Special Instructions: FOLLOW UP WITH DR Gwenlyn Found AT THE 1ST AVAILABLE TO REVIEW ABI's  Follow-Up: . You will need a follow up appointment in 6 MONTHS.  Please call our office 2 months in advance(FEB 2020) to schedule (April) appointment.  You may see  DR Ellyn Hack or one of the following Advanced Practice Providers on your designated Care Team:   . Rosaria Ferries, PA-C . Jory Sims, DNP, ANP  Labwork: IN THE FUTURE if you have labs (blood work) drawn today and your tests are completely normal, you will receive your results only by: Marland Kitchen MyChart Message (if you have MyChart) OR . A paper copy in the mail If you have any lab test that is abnormal or we need to change your treatment, we will call you to review the results.  At [facility], you and your health needs are our priority.  As part of our continuing mission to provide you with exceptional heart care, we have created designated Provider Care Teams.  These Care Teams include your primary Cardiologist (physician) and Advanced Practice Providers (APPs -  Physician Assistants and Nurse Practitioners) who all work together to provide you with the care you need, when you need it.  Thank you for choosing [facility] at [facility]!!

## 2018-06-03 NOTE — Telephone Encounter (Signed)
Is this ok to refill?  

## 2018-06-03 NOTE — Progress Notes (Signed)
Cardiology Office Note   Date:  06/03/2018   ID:  [name], DOB [birth year], MRN [id number]  PCP:  Denita Lung, MD  Cardiologist:  [place]  Chief Complaint  Patient presents with  . Bradycardia  . Hypertension  . PAD     History of Present Illness: [name] is a 84 y.o. female who presents for ongoing assessment and management of CAD,(PCI with BMS to the RCA 2007, distal stent stenosis in RCA treated with BMS distally, Cx and OM lesion treated with BMS),, AoV stenosis, dyslipidemia, DVT, cerebral aneurysm without rupture, CVA, bilateral RAS. Was last seen by Dr. Ellyn Hack on 11/25/2016. She is due for repeat echocardiogram to assess AoV stenosis. Recent myoview in 2011 negative for ischemia.   On last office visit, dated 04/22/2018 she was found to be bradycardic  With 2nd degree AV block, new compared to previous EKG. Coreg was discontinued. Zio XL heart monitor was placed to evaluate HR off of BB.   She is feeling so much better. She has become more active, she is going to the stores and the mall now. She does not have to lean on her basket due to fatigue. She is outside in her yard more    Results from ABI; are pending as they were just completed this am.   Monitor report  05/01/2018   Minimum heart rate 43 bpm. Maximal heart rate 174 bpm. Average heart rate 75 bpm.  Multiple PAT/SVT runs 6-8 beats noted -fastest 174 bpm (6 beats).  Multiple PACs noted. Mostly singlets. Occasional couplets and short SVT runs.  Rare PVCs.  No patient triggered events.  Maximum sinus tachycardia rate 133 bpm.  She has been seen for ABI;s test today before coming in due to reduced blood flow to her right leg. She has a history of PAD and has been followed by Dr. Gwenlyn Found in the past.    Past Medical History:  Diagnosis Date  . Anemia   . Aortic valve stenosis, senile calcific January 2011; Jan 2017   a. Echo: Normal EF 60-65%, mild aortic stenosis - estimated valve area 1.1 cm ;  b. Feb '17: EF 65-70%, no RWMA. Gr 1 DD. Vigorous - dynamic outflow gradient (4.9 m/s). Mod AS (mean Grad 21 mmHg), Mild MS, Mod TR - PAP ~58 mmHg   . Arthritis   . Bilateral renal artery stenosis ([place]) 2006 ([place])   - Right renal artery 60% stenosis March '13  . Carotid artery occlusion   . Cerebral aneurysm without rupture    3 mm; followed by Dr. Arnoldo Morale as well as vascular surgery  . Chronic kidney disease 1964  . Coronary artery disease - history of MI October 2006   Severe RCA lesion -- 2.5 Mm x 12 Mm Vision BMS; January 2007 -. Distal stent stenosis in RCA treated with 2.5 mm x y 12 mm Vision BMS stent distally;  circumflex/OM stenosis -- 2.5 mg 12 mm Mini Vision BMS ;  most recent cath October 11: 100% RCA occlusion with ISR. Left right collaterals. Patent to the extent, the 70% AV groove circumflex. Apical LAD stenosis 70-80 %, diffuse disease; EF 65-70%   . DVT (deep venous thrombosis) ([place])   . Dyslipidemia, goal LDL below 70     no longer on statin  . Fall at home Jan. 22, 2016  . Hypertension    Previously on multiple medications now on only clonidine  . Non-ST elevation MI (NSTEMI) ([place]) 2006, 2011  . Ovarian  cancer [facility]) 1963  . Peripheral vascular disease ([place]) 2010   Known distal left SFA occlusion, known right posterior tibial artery occlusion as well as left posterior and anterior tibial artery occlusion.; Most recent Dopplers November 2012: Patent right SFA stent, left ABI 0.56  . Stroke [facility]) Feb. 2015   Carotid artery Doppler show right internal carotid 60-80% stenosis. Previously followed by Dr. Donzetta Kohut    Past Surgical History:  Procedure Laterality Date  . ABDOMINAL HYSTERECTOMY    . APPENDECTOMY    . BLADDER SURGERY     bladder tack  . BREAST LUMPECTOMY Left   . CARDIAC CATHETERIZATION  05/28/2010   EF 65-70%,multi vessel CAD  . CAROTID DOPPLER  02/04/2012; January 2015   Done at [facility]----right internal 60 to 79%,bilateral external patent,bilateral common  carotid artery tortuosity present  . CORONARY ANGIOPLASTY WITH STENT PLACEMENT  05/27/2005   RCA - MiniVision BMS 2.5 mm x 12 mm  . CORONARY ANGIOPLASTY WITH STENT PLACEMENT Bilateral 01/23/20072007   Circumflex-OM -- 2.5 mm x 12 mm Mini Vision BMS; RCA distal to previous stent 2.5 mm x 12 mm Mini Vision BMS  . DOPPLER ECHOCARDIOGRAPHY  09/25/2009   ef 60 to 65%,aotric valve mild stenosis 1.1cm^2(VTI)  . FEMORAL ARTERY STENT Right 2006, 2011, 2012   Atherectomy with PTA alone in 2006; April 2000: Right SFA - overlapping stents (6.0 mm x 170 mm x2 and 6.0 mm x 40 mm); Cutting Balloon PTA of right SFA ISR -- patent by Dopplers and cath in 2012  . ILIAC ARTERY STENT Right 2006  . lower arterial extrem. doppler  07/23/2011   ABI RIGHT .92 ,LEFT ABI .33 ,RGT PROX.SFA STENT 0-49%,RGT SFA  W/O EVIDENCE OF SIGNIFICANT DIAMETER REDUCTION,RGT POPLITEAL ARTERY 50% NARROWING   . LOWER EXTREMITY ANGIOGRAM Left 07/04/2011   Procedure: LOWER EXTREMITY ANGIOGRAM;  Surgeon: Lorretta Harp, MD;  Location[facility] C[facility] LAB;  Service: Cardiovascular;  Laterality: Left;  . NM MYOCAR PERF WALL MOTION  10/13/2009   EF 73%  . POPLITEAL ARTERY ANGIOPLASTY  July 04, 2011   Done in the same setting as PTA of ISR in right SFA  . RENAL ARTERY STENT Bilateral 2006   Dr. Deon Pilling; patent as of 2012 cath  . renal doppler  11/13/2011   bil.renal arterial stents patent w/less than 60%,right and left kidneys equal in size  . TRANSTHORACIC ECHOCARDIOGRAM  2/'16; 10/17/2015   a.Vigorous LV function, EF 60-70%. Dynamic outflow tract obstruction. Midcavity obliteration. Peak intracavitary gradient 36 mmHg, moderate AS, Moderate-severely Ca++ MV -> mild MS. b. EF 65-70%, no RWMA. Gr 1 DD. Vigorous - dynamic outflow gradient (4.9 m/s). Mod AS (mean Grad 21 mmHg), Mild MS, Mod TR - PAP ~58 mmHg      Current Outpatient Medications  Medication Sig Dispense Refill  . acetaminophen (TYLENOL) 500 MG tablet Take 500 mg by mouth every 6  (six) hours as needed for mild pain, moderate pain or headache.     Marland Kitchen amLODipine (NORVASC) 5 MG tablet Take 1 tablet (5 mg total) by mouth daily. Make OV for further refills. 90 tablet 0  . aspirin 81 MG EC tablet Take 1 tablet (81 mg total) by mouth daily. Swallow whole. 90 tablet 3  . atorvastatin (LIPITOR) 20 MG tablet TAKE 1 TABLET BY MOUTH ONCE DAILY 90 tablet 0  . chlorthalidone (HYGROTON) 25 MG tablet TAKE 1/2 TABLET BY MOUTH EVERY DAY 45 tablet 2  . clopidogrel (PLAVIX) 75 MG tablet Take 1 tablet (75 mg  total) by mouth daily. Please keep upcoming appointment for further refills. 30 tablet 0  . FLUAD 0.5 ML SUSY ADM 0.5ML IM UTD  0  . FLUoxetine (PROZAC) 10 MG capsule TAKE 1 CAPSULE(10 MG) BY MOUTH DAILY 30 capsule 1  . NEOMYCIN-POLYMYXIN-HYDROCORTISONE (CORTISPORIN) 1 % SOLN OTIC solution Apply 1-2 drops to toe BID after soaking 10 mL 1  . OVER THE COUNTER MEDICATION Take 1 capsule by mouth daily as needed (constipation). STOOL SOFTNER    . pantoprazole (PROTONIX) 40 MG tablet TAKE 1 TABLET BY MOUTH DAILY 45 MINUTES BEFORE BREAKFAST 90 tablet 0  . potassium chloride 20 MEQ/15ML (10%) SOLN TK 15 MLS PO BID  2  . Propylene Glycol-Glycerin (SOOTHE OP) Place 1 drop into both eyes 2 (two) times daily.     . rosuvastatin (CRESTOR) 20 MG tablet TAKE 1 TABLET(20 MG) BY MOUTH AT BEDTIME 90 tablet 0   No current facility-administered medications for this visit.     Allergies:   Codeine    Social History:  The patient  reports that she has never smoked. She has never used smokeless tobacco. She reports that she does not drink alcohol or use drugs.   Family History:  The patient's family history includes Alcoholism in her brother and sister; Alzheimer's disease in her sister; Heart attack in her daughter and father; Irritable bowel syndrome in her daughter; Ovarian cancer in her mother.    ROS: All other systems are reviewed and negative. Unless otherwise mentioned in H&P    PHYSICAL  EXAM: VS:  BP (!) 149/82   Pulse 71   Ht 5' 3"  (1.6 m)   Wt 137 lb 9.6 oz (62.4 kg)   BMI 24.37 kg/m  , BMI Body mass index is 24.37 kg/m. GEN: Well nourished, well developed, in no acute distress HEENT: normal Neck: no JVD, carotid bruits, or masses Cardiac: RRR; no murmurs, rubs, or gallops,no edema  Respiratory:  Clear to auscultation bilaterally, normal work of breathing GI: soft, nontender, nondistended, + BS MS: no deformity or atrophy Skin: warm and dry, no rash Neuro:  Strength and sensation are intact Psych: euthymic mood, full affect   EKG:  NSR rate of 71 bpm, Non-specific T-wave abnormalities. Flattening in the inferior/lateral leads.   Recent Labs: 11/26/2017: ALT 7; Hemoglobin 11.5; Platelets 200 02/17/2018: BUN 27; Creatinine, Ser 1.87; Potassium 3.4; Sodium 141    Lipid Panel    Component Value Date/Time   CHOL 182 02/17/2018 1148   TRIG 134 02/17/2018 1148   HDL 46 02/17/2018 1148   CHOLHDL 4.0 02/17/2018 1148   CHOLHDL 4.9 11/28/2016 1616   VLDL 38 (H) 11/28/2016 1616   LDLCALC 109 (H) 02/17/2018 1148      Wt Readings from Last 3 Encounters:  06/03/18 137 lb 9.6 oz (62.4 kg)  04/22/18 138 lb 6.4 oz (62.8 kg)  04/13/18 139 lb 9.6 oz (63.3 kg)      Other studies Reviewed: Echocardiogram 11-01-2015   Left ventricle: The cavity size was normal. There was mild focal basal hypertrophy of the septum. Systolic function was vigorous. The estimated ejection fraction was in the range of 65% to 70%. Wall motion was normal; there were no regional wall motion abnormalities. Doppler parameters are consistent with abnormal left ventricular relaxation (grade 1 diastolic dysfunction). Doppler parameters are consistent with high ventricular filling pressure. - Aortic valve: Valve mobility was restricted. There was moderate stenosis. There was trivial regurgitation. - Mitral valve: Calcified annulus. The findings are consistent with mild  stenosis. There was mild regurgitation. - Left atrium: The atrium was mildly dilated. - Tricuspid valve: There was moderate regurgitation. - Pulmonary arteries: Systolic pressure was moderately to severely increased. PA peak pressure: 58 mm Hg (S).  Impressions:  - Vigorous LV function; intracavitary gradient of 4.9 m/s with valsalva; grade 1 diastolic dysfunction with elevated LV filling pressure; calcified aortic valve with moderate AS (mean gradient 31 mmHg) and trace AI; MAC with mild MS; mild LAE; moderate TR with moderate to severe elevation in pulmonary pressure  ASSESSMENT AND PLAN:  1. Bradycardia: Now much better with discontinuation of coreg. She was noted to have PAT episodes during the evaluation. She was completely asymptomatic. Can consider adding back low dose BB if necessary should she become symptomatic.   2. Right Leg Pain with known PAD: Repeat ABI's were completed today. This revealed > 50% iin stent stenosis in the distal SFA. Preliminary result 0.54 on the right and 0.64 on the left, compared to 0.79 on the right and 0.64 on the left on 1/18. She will be referred to Dr. Gwenlyn Found or Fletcher Anon.   3. Hypertension: With discontinuation of coreg, BP has risen.I will increase chlorthalidone to 25 mg from 12.5 mg daily. She will need repeat BMET on follow up.   4. AoV Stenosis: Not a candidate for TAVR with age and co-morbidities.    Current medicines are reviewed at length with the patient today.    Labs/ tests ordered today include: BMET Refer to [employer] or Artist Pais. [place] Pugh, ANP, AACC   06/03/2018 1:36 PM    [address] Office [phone] Fax [phone]

## 2018-06-04 ENCOUNTER — Ambulatory Visit (HOSPITAL_COMMUNITY)
Admission: RE | Admit: 2018-06-04 | Discharge: 2018-06-04 | Disposition: A | Discharge status: Home / Self Care | Payer: Medicare Other | Source: Ambulatory Visit | Attending: Vascular Surgery | Admitting: Vascular Surgery

## 2018-06-04 ENCOUNTER — Encounter: Payer: Self-pay | Admitting: Family

## 2018-06-04 ENCOUNTER — Ambulatory Visit (INDEPENDENT_AMBULATORY_CARE_PROVIDER_SITE_OTHER): Payer: Medicare Other | Admitting: Family

## 2018-06-04 VITALS — BP 141/77 | HR 64 | Temp 97.6°F | Resp 18 | Ht 63.0 in | Wt 140.0 lb

## 2018-06-04 DIAGNOSIS — Z8673 Personal history of transient ischemic attack (TIA), and cerebral infarction without residual deficits: Secondary | ICD-10-CM

## 2018-06-04 DIAGNOSIS — I6521 Occlusion and stenosis of right carotid artery: Secondary | ICD-10-CM

## 2018-06-04 DIAGNOSIS — I6522 Occlusion and stenosis of left carotid artery: Secondary | ICD-10-CM | POA: Diagnosis not present

## 2018-06-04 DIAGNOSIS — I779 Disorder of arteries and arterioles, unspecified: Secondary | ICD-10-CM | POA: Diagnosis not present

## 2018-06-04 DIAGNOSIS — L97519 Non-pressure chronic ulcer of other part of right foot with unspecified severity: Secondary | ICD-10-CM

## 2018-06-04 NOTE — Patient Instructions (Signed)

## 2018-06-04 NOTE — Progress Notes (Signed)
Chief Complaint: Follow up Extracranial Carotid Artery Stenosis   History of Present Illness  [name] is a 84 y.o. female whom Dr. Kellie Simmering has monitored for known extracranial carotid artery stenosis, right greater than left.  She returns today for follow up.   She also has a history of PAD followed by Dr. Ellyn Hack and Dr. Gwenlyn Found, and is s/p right femoral artery stent in 2006, 2011, and 2012. Atherectomy with PTA alone in 2006; April 2000: Right SFA - overlapping stents (6.0 mm x 170 mm x2 and 6.0 mm x 40 mm); Cutting Balloon PTA of right SFA ISR -- patent by Dopplers and cath in 2012. Bilateral renal artery stents in 2006 Dr. Deon Pilling; patent as of 2012 cath. Right iliac artery stent in 2006. Popliteal angioplasty July 04, 2011 done in the same setting as PTA of ISR in right SFA.   Pt denies LE claudication symptomswith walking,her feet are most painful at night. She has occasional burning and numbness in both feet.   Patient has not had previous carotid artery intervention.   She states that she has a stroke in February, 2015 as manifested by right racial drooping and slurred speech that mostly resolved in a month, denies hemiparesis, denies monocular loss of vision, has had no further stroke or TIA's. She had a stroke about 2004 as manifested by left eye amaurosis fugax which lasted about 9 weeks, then resolved.   She has had no headaches since 2013 or 2014.  She had ABI's and LE arterial duplex on 06-04-18, requested by Dr. Ellyn Hack, saw Jory Sims NP yesterday, whose note indicates pt will be referred to Dr. Gwenlyn Found to address her severe PAD. Her right leg "gave out" recently, has not happened since then.   She sees a podiatrist.   She is keeping her great grandchildren at night while her daughter works at night.    Diabetic: she has metabolic syndrome, review of records shows A1C of 5.7 on 02-17-18 Tobacco use: non-smoker  Pt meds include:  Statin : Yes   Betablocker: no ASA: Yes Other anticoagulants/antiplatelets: Plavix    Past Medical History:  Diagnosis Date  . Anemia   . Aortic valve stenosis, senile calcific January 2011; Jan 2017   a. Echo: Normal EF 60-65%, mild aortic stenosis - estimated valve area 1.1 cm ; b. Feb '17: EF 65-70%, no RWMA. Gr 1 DD. Vigorous - dynamic outflow gradient (4.9 m/s). Mod AS (mean Grad 21 mmHg), Mild MS, Mod TR - PAP ~58 mmHg   . Arthritis   . Bilateral renal artery stenosis ([place]) 2006 ([place])   - Right renal artery 60% stenosis March '13  . Carotid artery occlusion   . Cerebral aneurysm without rupture    3 mm; followed by Dr. Arnoldo Morale as well as vascular surgery  . Chronic kidney disease 1964  . Coronary artery disease - history of MI October 2006   Severe RCA lesion -- 2.5 Mm x 12 Mm Vision BMS; January 2007 -. Distal stent stenosis in RCA treated with 2.5 mm x y 12 mm Vision BMS stent distally;  circumflex/OM stenosis -- 2.5 mg 12 mm Mini Vision BMS ;  most recent cath October 11: 100% RCA occlusion with ISR. Left right collaterals. Patent to the extent, the 70% AV groove circumflex. Apical LAD stenosis 70-80 %, diffuse disease; EF 65-70%   . DVT (deep venous thrombosis) ([place])   . Dyslipidemia, goal LDL below 70     no longer on statin  .  Fall at home Jan. 22, 2016  . Hypertension    Previously on multiple medications now on only clonidine  . Non-ST elevation MI (NSTEMI) ([place]) 2006, 2011  . Ovarian cancer ([place]) 1963  . Peripheral vascular disease ([place]) 2010   Known distal left SFA occlusion, known right posterior tibial artery occlusion as well as left posterior and anterior tibial artery occlusion.; Most recent Dopplers November 2012: Patent right SFA stent, left ABI 0.56  . Stroke [facility]) Feb. 2015   Carotid artery Doppler show right internal carotid 60-80% stenosis. Previously followed by Dr. Donzetta Kohut    Social History Social History   Tobacco Use  . Smoking status: Never Smoker  .  Smokeless tobacco: Never Used  Substance Use Topics  . Alcohol use: No    Comment: social drinker  years ago-no longer  . Drug use: No    Family History Family History  Problem Relation Age of Onset  . Ovarian cancer Mother   . Heart attack Father   . Irritable bowel syndrome Daughter   . Alcoholism Brother   . Alcoholism Sister   . Alzheimer's disease Sister   . Heart attack Daughter   . Colon cancer Neg Hx     Surgical History Past Surgical History:  Procedure Laterality Date  . ABDOMINAL HYSTERECTOMY    . APPENDECTOMY    . BLADDER SURGERY     bladder tack  . BREAST LUMPECTOMY Left   . CARDIAC CATHETERIZATION  05/28/2010   EF 65-70%,multi vessel CAD  . CAROTID DOPPLER  02/04/2012; January 2015   [facility]----right internal 60 to 79%,bilateral external patent,bilateral common carotid artery tortuosity present  . CORONARY ANGIOPLASTY WITH STENT PLACEMENT  05/27/2005   RCA - MiniVision BMS 2.5 mm x 12 mm  . CORONARY ANGIOPLASTY WITH STENT PLACEMENT Bilateral 01/23/20072007   Circumflex-OM -- 2.5 mm x 12 mm Mini Vision BMS; RCA distal to previous stent 2.5 mm x 12 mm Mini Vision BMS  . DOPPLER ECHOCARDIOGRAPHY  09/25/2009   ef 60 to 65%,aotric valve mild stenosis 1.1cm^2(VTI)  . FEMORAL ARTERY STENT Right 2006, 2011, 2012   Atherectomy with PTA alone in 2006; April 2000: Right SFA - overlapping stents (6.0 mm x 170 mm x2 and 6.0 mm x 40 mm); Cutting Balloon PTA of right SFA ISR -- patent by Dopplers and cath in 2012  . ILIAC ARTERY STENT Right 2006  . lower arterial extrem. doppler  07/23/2011   ABI RIGHT .92 ,LEFT ABI .48 ,RGT PROX.SFA STENT 0-49%,RGT SFA  W/O EVIDENCE OF SIGNIFICANT DIAMETER REDUCTION,RGT POPLITEAL ARTERY 50% NARROWING   . LOWER EXTREMITY ANGIOGRAM Left 07/04/2011   Procedure: LOWER EXTREMITY ANGIOGRAM;  Surgeon: Lorretta Harp, MD;  Location: [facility] CATH LAB;  Service: Cardiovascular;  Laterality: Left;  . NM MYOCAR PERF WALL MOTION  10/13/2009   EF 73%   . POPLITEAL ARTERY ANGIOPLASTY  July 04, 2011   Done in the same setting as PTA of ISR in right SFA  . RENAL ARTERY STENT Bilateral 2006   Dr. Deon Pilling; patent as of 2012 cath  . renal doppler  11/13/2011   bil.renal arterial stents patent w/less than 60%,right and left kidneys equal in size  . TRANSTHORACIC ECHOCARDIOGRAM  2/'16; 10/17/2015   a.Vigorous LV function, EF 60-70%. Dynamic outflow tract obstruction. Midcavity obliteration. Peak intracavitary gradient 36 mmHg, moderate AS, Moderate-severely Ca++ MV -> mild MS. b. EF 65-70%, no RWMA. Gr 1 DD. Vigorous - dynamic outflow gradient (4.9 m/s). Mod AS (mean Grad  21 mmHg), Mild MS, Mod TR - PAP ~58 mmHg     Allergies  Allergen Reactions  . Codeine Rash    Current Outpatient Medications  Medication Sig Dispense Refill  . acetaminophen (TYLENOL) 500 MG tablet Take 500 mg by mouth every 6 (six) hours as needed for mild pain, moderate pain or headache.     Marland Kitchen amLODipine (NORVASC) 5 MG tablet Take 1 tablet (5 mg total) by mouth daily. Make OV for further refills. 90 tablet 0  . aspirin 81 MG EC tablet Take 1 tablet (81 mg total) by mouth daily. Swallow whole. 90 tablet 3  . atorvastatin (LIPITOR) 20 MG tablet TAKE 1 TABLET BY MOUTH ONCE DAILY 90 tablet 0  . chlorthalidone (HYGROTON) 25 MG tablet Take 1 tablet (25 mg total) by mouth daily. 90 tablet 2  . clopidogrel (PLAVIX) 75 MG tablet Take 1 tablet (75 mg total) by mouth daily. Please keep upcoming appointment for further refills. 30 tablet 0  . FLUAD 0.5 ML SUSY ADM 0.5ML IM UTD  0  . FLUoxetine (PROZAC) 10 MG capsule TAKE 1 CAPSULE(10 MG) BY MOUTH DAILY 30 capsule 1  . NEOMYCIN-POLYMYXIN-HYDROCORTISONE (CORTISPORIN) 1 % SOLN OTIC solution Apply 1-2 drops to toe BID after soaking 10 mL 1  . OVER THE COUNTER MEDICATION Take 1 capsule by mouth daily as needed (constipation). STOOL SOFTNER    . pantoprazole (PROTONIX) 40 MG tablet TAKE 1 TABLET BY MOUTH DAILY 45 MINUTES BEFORE BREAKFAST  90 tablet 0  . potassium chloride 20 MEQ/15ML (10%) SOLN TK 15 MLS PO BID  2  . Propylene Glycol-Glycerin (SOOTHE OP) Place 1 drop into both eyes 2 (two) times daily.     . rosuvastatin (CRESTOR) 20 MG tablet TAKE 1 TABLET(20 MG) BY MOUTH AT BEDTIME 90 tablet 0   No current facility-administered medications for this visit.     Review of Systems : See HPI for pertinent positives and negatives.  Physical Examination  Vitals:   06/04/18 1521  BP: 132/85  Pulse: 64  Resp: 18  Temp: 97.6 F (36.4 C)  TempSrc: Oral  SpO2: 98%  Weight: 140 lb (63.5 kg)  Height: 5\' 3"  (1.6 m)   Body mass index is 24.8 kg/m.  General: WDWN elderly female that appears younger than her stated age, in NAD GAIT: normal Eyes: PERRLA HENT: Edentulous  Pulmonary:  Respirations are non-labored, good air movement in all fields, CTAB, no rales, rhonchi, or wheezes Cardiac: regular rhythm and rate, + murmur.  VASCULAR EXAM Carotid Bruits Right Left   Transmitted cardiac murmur Transmitted cardiac murmur     Abdominal aortic pulse is not palpable. Radial pulses are 2+ palpable and equal.                                                                                                                            LE Pulses Right Left       FEMORAL  not palpable (seated)  not  palpable        POPLITEAL  not palpable   not palpable       POSTERIOR TIBIAL  not palpable   not palpable        DORSALIS PEDIS      ANTERIOR TIBIAL not palpable  not palpable     Gastrointestinal: soft, nontender, BS WNL, no r/g, no palpable masses. Musculoskeletal: no muscle atrophy/wasting. M/S 5/5 throughout, extremities without ischemic changes except small area of dark tissue at lateral aspect of right 4th toenail with scant bloody drainage. She had cut a hole in her right shoe to prevent any pressure at this toe. 3rd toe tip is surgically absent.  Skin: No rashes, no cellulitis. See Muscoskeletal.   Neurologic:  A&O X 3;  appropriate affect, sensation is normal; speech is normal, CN 2-12 intact, pain and light touch intact in extremities, motor exam as listed above. Psychiatric: Normal thought content, mood appropriate to clinical situation.    Assessment: [name] is a 84 y.o. female who had a stroke in 2004 and again in February of 2015; no subsequent stroke or TIA.  Her atherosclerotic risk factors include metabolic syndrome, CAD, extensive family hx of CVD events, and advanced age. However, she is active for her advanced age and has never used tobacco.  She still drives, is mentally sharp, and is caring for her great grandchildren at night while her grand daughter works at night.  She walks without the aid of a cane or walker.  Her feet are painful at night. She has cut a hole in her right shoe to relieve any pressure on her right 4th toe that has a dark non healing wound.   Dr. Ellyn Hack and Dr. Gwenlyn Found have been monitoring patient's PAD, she has a small non healing wound adjacent to her right 4th toenail that was started with her grand daughter cutting her toenails. She saw Jory Sims, PA, in Dr. Ellyn Hack office yesterday. ABI's that day were 0.50 right and 0.60 left; absent PT on the right, absent AT on the left; otherwise dampened monophasic waveforms bilaterally.    DATA  Carotid duplex(06-04-18): 60 - 79 % right stenosis. Left ICA: 1 - 39 % stenosis. Bilateral vertebral artery flow is antegrade.  Bilateral subclavian artery waveforms are normal. No significant change compared to the exams on 02-17-17, and 09-01-17  CT of head on 09/27/13: IMPRESSION:  Evolving small right posterior frontal infarct (corresponding to  recent MRI abnormality), without acute intracranial process.  Left occipital encephalomalacia may reflect remote posterior  cerebral artery territory infarct or trauma. Moderate white matter  changes suggest chronic small vessel ischemic disease.     Plan: Follow-up in 6 months with Carotid Duplex scan.   I discussed in depth with the patient the nature of atherosclerosis, and emphasized the importance of maximal medical management including strict control of blood pressure, blood glucose, and lipid levels, obtaining regular exercise, and continued cessation of smoking.  The patient is aware that without maximal medical management the underlying atherosclerotic disease process will progress, limiting the benefit of any interventions. The patient was given information about stroke prevention and what symptoms should prompt the patient to seek immediate medical care. Thank you for allowing [place] to participate in this patient's care.  Clemon Chambers, RN, MSN, FNP-C [facility] Office: [phone]  Clinic Physician: Oneida Alar  06/04/18 3:24 PM

## 2018-06-05 NOTE — Addendum Note (Signed)
Addended by: Leanord Asal T on: 06/05/2018 03:19 PM   Modules accepted: Orders

## 2018-06-08 ENCOUNTER — Telehealth: Payer: Self-pay | Admitting: *Deleted

## 2018-06-08 NOTE — Telephone Encounter (Signed)
Pt states she has question about her toe.

## 2018-06-08 NOTE — Telephone Encounter (Signed)
Unable to leave a message voicemail states memory is full.

## 2018-06-11 ENCOUNTER — Encounter: Payer: Self-pay | Admitting: Podiatry

## 2018-06-11 ENCOUNTER — Ambulatory Visit (INDEPENDENT_AMBULATORY_CARE_PROVIDER_SITE_OTHER): Payer: Self-pay | Admitting: Podiatry

## 2018-06-11 DIAGNOSIS — L600 Ingrowing nail: Secondary | ICD-10-CM

## 2018-06-11 MED ORDER — TRAMADOL HCL 50 MG PO TABS: 50.0000 mg | ORAL_TABLET | Freq: Three times a day (TID) | ORAL | 0 refills | Status: DC | PRN

## 2018-06-12 ENCOUNTER — Telehealth: Payer: Self-pay | Admitting: Podiatry

## 2018-06-12 NOTE — Telephone Encounter (Signed)
Pt called and stated the pharmacy has not gotten any rx for pain medication.  I talked with JQ and she said she gave it to the pt and to tell pt to check her purse.  I called pt and she said she did not get any rx but that she is taking tylenol and it is helping so do not worry about it. I told her if anything changes to please let us know.Marland KitchenMarland Kitchen

## 2018-06-15 ENCOUNTER — Other Ambulatory Visit: Payer: Self-pay | Admitting: Podiatry

## 2018-06-15 MED ORDER — TRAMADOL HCL 50 MG PO TABS: 50.0000 mg | ORAL_TABLET | Freq: Three times a day (TID) | ORAL | 0 refills | Status: AC | PRN

## 2018-06-15 NOTE — Progress Notes (Signed)
Re-ordered pain medication as she cannot find her prescription. Sent same medication as previously ordered.

## 2018-06-15 NOTE — Telephone Encounter (Signed)
Resent as ordered previously but decreased quantity

## 2018-06-15 NOTE — Telephone Encounter (Signed)
Pt called back stating that the Tylenol is no longer working. Previous prescription was not received. Please called patient.

## 2018-06-16 NOTE — Telephone Encounter (Signed)
I informed pt of Dr. Wagoner's orders. 

## 2018-06-18 NOTE — Progress Notes (Signed)
Patient is here today with complaint of pain in her right fourth toe, recent procedure ingrown nail removal fibular border fourth right toe, procedure performed on 05/07/2018.  She is concerned with the dark coloration on her toe.  She states that it throbs sometimes.  Noted blistering to the fourth toe around the nailbed, the discoloration appears to have come from the blister itself.  There is no redness, no erythema, and is not painful when touched.  There appears to be good circulation to the toe capillary refill is adequate.  Advised patient to continue soaking her toe once a day, and she is to take Tylenol when her toe hurts.  She was also evaluated by Dr. Milinda Pointer, who advised her to follow-up in 2 weeks if the symptoms did not improve or sooner if they worsen.

## 2018-06-22 ENCOUNTER — Emergency Department (HOSPITAL_COMMUNITY): Payer: Medicare Other

## 2018-06-22 ENCOUNTER — Encounter (HOSPITAL_COMMUNITY): Payer: Self-pay | Admitting: Emergency Medicine

## 2018-06-22 ENCOUNTER — Inpatient Hospital Stay (HOSPITAL_COMMUNITY)
Admission: EM | Admit: 2018-06-22 | Discharge: 2018-06-27 | DRG: 247 | Disposition: A | Discharge status: Home / Self Care | Payer: Medicare Other | Attending: Cardiovascular Disease | Admitting: Cardiovascular Disease

## 2018-06-22 ENCOUNTER — Inpatient Hospital Stay (HOSPITAL_COMMUNITY)
Admission: EM | Disposition: A | Discharge status: Home / Self Care | Payer: Self-pay | Source: Home / Self Care | Attending: Cardiovascular Disease

## 2018-06-22 ENCOUNTER — Other Ambulatory Visit: Payer: Self-pay

## 2018-06-22 ENCOUNTER — Inpatient Hospital Stay (HOSPITAL_COMMUNITY): Payer: Medicare Other

## 2018-06-22 DIAGNOSIS — Z8543 Personal history of malignant neoplasm of ovary: Secondary | ICD-10-CM | POA: Diagnosis not present

## 2018-06-22 DIAGNOSIS — I080 Rheumatic disorders of both mitral and aortic valves: Secondary | ICD-10-CM | POA: Diagnosis present

## 2018-06-22 DIAGNOSIS — N184 Chronic kidney disease, stage 4 (severe): Secondary | ICD-10-CM | POA: Diagnosis not present

## 2018-06-22 DIAGNOSIS — I255 Ischemic cardiomyopathy: Secondary | ICD-10-CM | POA: Diagnosis not present

## 2018-06-22 DIAGNOSIS — Z8249 Family history of ischemic heart disease and other diseases of the circulatory system: Secondary | ICD-10-CM | POA: Diagnosis not present

## 2018-06-22 DIAGNOSIS — Z743 Need for continuous supervision: Secondary | ICD-10-CM | POA: Diagnosis not present

## 2018-06-22 DIAGNOSIS — I129 Hypertensive chronic kidney disease with stage 1 through stage 4 chronic kidney disease, or unspecified chronic kidney disease: Secondary | ICD-10-CM | POA: Diagnosis present

## 2018-06-22 DIAGNOSIS — E876 Hypokalemia: Secondary | ICD-10-CM | POA: Diagnosis not present

## 2018-06-22 DIAGNOSIS — I639 Cerebral infarction, unspecified: Secondary | ICD-10-CM | POA: Diagnosis present

## 2018-06-22 DIAGNOSIS — I2101 ST elevation (STEMI) myocardial infarction involving left main coronary artery: Secondary | ICD-10-CM | POA: Diagnosis not present

## 2018-06-22 DIAGNOSIS — I700 Atherosclerosis of aorta: Secondary | ICD-10-CM | POA: Diagnosis present

## 2018-06-22 DIAGNOSIS — Z90710 Acquired absence of both cervix and uterus: Secondary | ICD-10-CM | POA: Diagnosis not present

## 2018-06-22 DIAGNOSIS — Z955 Presence of coronary angioplasty implant and graft: Secondary | ICD-10-CM | POA: Diagnosis not present

## 2018-06-22 DIAGNOSIS — E785 Hyperlipidemia, unspecified: Secondary | ICD-10-CM | POA: Diagnosis not present

## 2018-06-22 DIAGNOSIS — I499 Cardiac arrhythmia, unspecified: Secondary | ICD-10-CM | POA: Diagnosis not present

## 2018-06-22 DIAGNOSIS — I6529 Occlusion and stenosis of unspecified carotid artery: Secondary | ICD-10-CM | POA: Diagnosis present

## 2018-06-22 DIAGNOSIS — I2510 Atherosclerotic heart disease of native coronary artery without angina pectoris: Secondary | ICD-10-CM

## 2018-06-22 DIAGNOSIS — I779 Disorder of arteries and arterioles, unspecified: Secondary | ICD-10-CM | POA: Diagnosis present

## 2018-06-22 DIAGNOSIS — Z8673 Personal history of transient ischemic attack (TIA), and cerebral infarction without residual deficits: Secondary | ICD-10-CM

## 2018-06-22 DIAGNOSIS — I350 Nonrheumatic aortic (valve) stenosis: Secondary | ICD-10-CM

## 2018-06-22 DIAGNOSIS — R0789 Other chest pain: Secondary | ICD-10-CM | POA: Diagnosis not present

## 2018-06-22 DIAGNOSIS — I739 Peripheral vascular disease, unspecified: Secondary | ICD-10-CM | POA: Diagnosis present

## 2018-06-22 DIAGNOSIS — Z7902 Long term (current) use of antithrombotics/antiplatelets: Secondary | ICD-10-CM | POA: Diagnosis not present

## 2018-06-22 DIAGNOSIS — Z79899 Other long term (current) drug therapy: Secondary | ICD-10-CM | POA: Diagnosis not present

## 2018-06-22 DIAGNOSIS — Z8041 Family history of malignant neoplasm of ovary: Secondary | ICD-10-CM

## 2018-06-22 DIAGNOSIS — Z7982 Long term (current) use of aspirin: Secondary | ICD-10-CM

## 2018-06-22 DIAGNOSIS — E782 Mixed hyperlipidemia: Secondary | ICD-10-CM

## 2018-06-22 DIAGNOSIS — Z86718 Personal history of other venous thrombosis and embolism: Secondary | ICD-10-CM | POA: Diagnosis not present

## 2018-06-22 DIAGNOSIS — I671 Cerebral aneurysm, nonruptured: Secondary | ICD-10-CM | POA: Diagnosis not present

## 2018-06-22 DIAGNOSIS — Z885 Allergy status to narcotic agent status: Secondary | ICD-10-CM | POA: Diagnosis not present

## 2018-06-22 DIAGNOSIS — I252 Old myocardial infarction: Secondary | ICD-10-CM

## 2018-06-22 DIAGNOSIS — I213 ST elevation (STEMI) myocardial infarction of unspecified site: Secondary | ICD-10-CM

## 2018-06-22 DIAGNOSIS — I10 Essential (primary) hypertension: Secondary | ICD-10-CM | POA: Diagnosis present

## 2018-06-22 DIAGNOSIS — I2119 ST elevation (STEMI) myocardial infarction involving other coronary artery of inferior wall: Principal | ICD-10-CM

## 2018-06-22 DIAGNOSIS — I2109 ST elevation (STEMI) myocardial infarction involving other coronary artery of anterior wall: Secondary | ICD-10-CM | POA: Diagnosis not present

## 2018-06-22 DIAGNOSIS — R079 Chest pain, unspecified: Secondary | ICD-10-CM | POA: Diagnosis not present

## 2018-06-22 HISTORY — PX: CORONARY ANGIOGRAPHY: CATH118303

## 2018-06-22 HISTORY — PX: CORONARY/GRAFT ACUTE MI REVASCULARIZATION: CATH118305

## 2018-06-22 LAB — CBC WITH DIFFERENTIAL/PLATELET
Abs Immature Granulocytes: 0.04 K/uL (ref 0.00–0.07)
Basophils Absolute: 0 K/uL (ref 0.0–0.1)
Basophils Relative: 0 %
Eosinophils Absolute: 0.2 K/uL (ref 0.0–0.5)
Eosinophils Relative: 3 %
HCT: 36.9 % (ref 36.0–46.0)
Hemoglobin: 11.3 g/dL — ABNORMAL LOW (ref 12.0–15.0)
Immature Granulocytes: 1 %
Lymphocytes Relative: 17 %
Lymphs Abs: 1.4 K/uL (ref 0.7–4.0)
MCH: 31.9 pg (ref 26.0–34.0)
MCHC: 30.6 g/dL (ref 30.0–36.0)
MCV: 104.2 fL — ABNORMAL HIGH (ref 80.0–100.0)
Monocytes Absolute: 0.8 K/uL (ref 0.1–1.0)
Monocytes Relative: 9 %
Neutro Abs: 6.2 K/uL (ref 1.7–7.7)
Neutrophils Relative %: 70 %
Platelets: 150 K/uL (ref 150–400)
RBC: 3.54 MIL/uL — ABNORMAL LOW (ref 3.87–5.11)
RDW: 12.4 % (ref 11.5–15.5)
WBC: 8.7 K/uL (ref 4.0–10.5)
nRBC: 0 % (ref 0.0–0.2)

## 2018-06-22 LAB — POCT I-STAT 3, ART BLOOD GAS (G3+)
ACID-BASE DEFICIT: 2 mmol/L (ref 0.0–2.0)
BICARBONATE: 23.4 mmol/L (ref 20.0–28.0)
O2 SAT: 94 %
PO2 ART: 70 mmHg — AB (ref 83.0–108.0)
TCO2: 25 mmol/L (ref 22–32)
pCO2 arterial: 39.4 mmHg (ref 32.0–48.0)
pH, Arterial: 7.382 (ref 7.350–7.450)

## 2018-06-22 LAB — MRSA PCR SCREENING: MRSA BY PCR: NEGATIVE

## 2018-06-22 LAB — COMPREHENSIVE METABOLIC PANEL WITH GFR
ALT: 13 U/L (ref 0–44)
AST: 24 U/L (ref 15–41)
Albumin: 4.1 g/dL (ref 3.5–5.0)
Alkaline Phosphatase: 82 U/L (ref 38–126)
Anion gap: 10 (ref 5–15)
BUN: 25 mg/dL — ABNORMAL HIGH (ref 8–23)
CO2: 22 mmol/L (ref 22–32)
Calcium: 10.8 mg/dL — ABNORMAL HIGH (ref 8.9–10.3)
Chloride: 104 mmol/L (ref 98–111)
Creatinine, Ser: 2.24 mg/dL — ABNORMAL HIGH (ref 0.44–1.00)
GFR calc Af Amer: 21 mL/min — ABNORMAL LOW
GFR calc non Af Amer: 18 mL/min — ABNORMAL LOW
Glucose, Bld: 167 mg/dL — ABNORMAL HIGH (ref 70–99)
Potassium: 3.1 mmol/L — ABNORMAL LOW (ref 3.5–5.1)
Sodium: 136 mmol/L (ref 135–145)
Total Bilirubin: 0.6 mg/dL (ref 0.3–1.2)
Total Protein: 7.1 g/dL (ref 6.5–8.1)

## 2018-06-22 LAB — POCT I-STAT, CHEM 8
BUN: 26 mg/dL — ABNORMAL HIGH (ref 8–23)
CALCIUM ION: 1.36 mmol/L (ref 1.15–1.40)
CREATININE: 2.3 mg/dL — AB (ref 0.44–1.00)
Chloride: 102 mmol/L (ref 98–111)
Glucose, Bld: 178 mg/dL — ABNORMAL HIGH (ref 70–99)
HCT: 37 % (ref 36.0–46.0)
HEMOGLOBIN: 12.6 g/dL (ref 12.0–15.0)
Potassium: 3 mmol/L — ABNORMAL LOW (ref 3.5–5.1)
Sodium: 138 mmol/L (ref 135–145)
TCO2: 24 mmol/L (ref 22–32)

## 2018-06-22 LAB — LIPID PANEL
CHOL/HDL RATIO: 3.5 ratio
Cholesterol: 155 mg/dL (ref 0–200)
HDL: 44 mg/dL (ref >40)
LDL Cholesterol: 85 mg/dL (ref 0–99)
TRIGLYCERIDES: 131 mg/dL (ref <150)
VLDL: 26 mg/dL (ref 0–40)

## 2018-06-22 LAB — POCT ACTIVATED CLOTTING TIME
ACTIVATED CLOTTING TIME: 169 s
Activated Clotting Time: 494 seconds

## 2018-06-22 LAB — TROPONIN I
TROPONIN I: 0.87 ng/mL — AB (ref <0.03)
TROPONIN I: 17.51 ng/mL — AB (ref <0.03)
Troponin I: 0.07 ng/mL
Troponin I: 42.62 ng/mL (ref <0.03)

## 2018-06-22 LAB — PROTIME-INR
INR: 1.08
PROTHROMBIN TIME: 13.9 s (ref 11.4–15.2)

## 2018-06-22 LAB — ECHOCARDIOGRAM LIMITED
Height: 65 in
WEIGHTICAEL: 2400 [oz_av]

## 2018-06-22 LAB — APTT: APTT: 24 s (ref 24–36)

## 2018-06-22 SURGERY — CORONARY/GRAFT ACUTE MI REVASCULARIZATION
Anesthesia: LOCAL

## 2018-06-22 MED ORDER — SODIUM CHLORIDE 0.9 % IV SOLN
INTRAVENOUS | Status: AC
  Administered 2018-06-22: 100 mL/h via INTRAVENOUS

## 2018-06-22 MED ORDER — ASPIRIN EC 81 MG PO TBEC
81.0000 mg | DELAYED_RELEASE_TABLET | Freq: Every day | ORAL | Status: DC
  Filled 2018-06-22: qty 1

## 2018-06-22 MED ORDER — LABETALOL HCL 5 MG/ML IV SOLN: 10.0000 mg | INTRAVENOUS | Status: AC | PRN

## 2018-06-22 MED ORDER — BIVALIRUDIN BOLUS VIA INFUSION - CUPID
INTRAVENOUS | Status: DC | PRN
  Administered 2018-06-22: 51 mg via INTRAVENOUS

## 2018-06-22 MED ORDER — PANTOPRAZOLE SODIUM 40 MG PO TBEC
40.0000 mg | DELAYED_RELEASE_TABLET | Freq: Every day | ORAL | Status: DC
  Administered 2018-06-22 – 2018-06-27 (×6): 40 mg via ORAL
  Filled 2018-06-22 (×6): qty 1

## 2018-06-22 MED ORDER — SODIUM CHLORIDE 0.9 % IV SOLN
INTRAVENOUS | Status: DC
  Administered 2018-06-22: 03:00:00 via INTRAVENOUS

## 2018-06-22 MED ORDER — HYDRALAZINE HCL 20 MG/ML IJ SOLN: 5.0000 mg | INTRAMUSCULAR | Status: AC | PRN

## 2018-06-22 MED ORDER — CHLORTHALIDONE 25 MG PO TABS
25.0000 mg | ORAL_TABLET | Freq: Every day | ORAL | Status: DC
  Administered 2018-06-22 – 2018-06-24 (×3): 25 mg via ORAL
  Filled 2018-06-22 (×3): qty 1

## 2018-06-22 MED ORDER — NITROGLYCERIN 0.4 MG SL SUBL
0.4000 mg | SUBLINGUAL_TABLET | SUBLINGUAL | Status: DC | PRN
  Administered 2018-06-22: 0.4 mg via SUBLINGUAL
  Filled 2018-06-22: qty 1

## 2018-06-22 MED ORDER — ENSURE ENLIVE PO LIQD
237.0000 mL | Freq: Two times a day (BID) | ORAL | Status: DC
  Administered 2018-06-22 – 2018-06-27 (×6): 237 mL via ORAL

## 2018-06-22 MED ORDER — ASPIRIN 81 MG PO CHEW
81.0000 mg | CHEWABLE_TABLET | Freq: Every day | ORAL | Status: DC
  Administered 2018-06-22 – 2018-06-27 (×6): 81 mg via ORAL
  Filled 2018-06-22 (×6): qty 1

## 2018-06-22 MED ORDER — ACETAMINOPHEN 325 MG PO TABS
650.0000 mg | ORAL_TABLET | ORAL | Status: DC | PRN
  Filled 2018-06-22: qty 2

## 2018-06-22 MED ORDER — AMLODIPINE BESYLATE 5 MG PO TABS: 5.0000 mg | ORAL_TABLET | Freq: Every day | ORAL | Status: DC

## 2018-06-22 MED ORDER — FLUOXETINE HCL 10 MG PO CAPS
10.0000 mg | ORAL_CAPSULE | Freq: Every day | ORAL | Status: DC
  Administered 2018-06-22 – 2018-06-27 (×6): 10 mg via ORAL
  Filled 2018-06-22 (×6): qty 1

## 2018-06-22 MED ORDER — SODIUM CHLORIDE 0.9 % IV SOLN: 0.2500 mg/kg/h | INTRAVENOUS | Status: AC

## 2018-06-22 MED ORDER — TICAGRELOR 90 MG PO TABS
90.0000 mg | ORAL_TABLET | Freq: Two times a day (BID) | ORAL | Status: DC
  Administered 2018-06-22 – 2018-06-27 (×10): 90 mg via ORAL
  Filled 2018-06-22 (×11): qty 1

## 2018-06-22 MED ORDER — SODIUM CHLORIDE 0.9 % IV SOLN
INTRAVENOUS | Status: AC | PRN
  Administered 2018-06-22: 0.25 mg/kg/h via INTRAVENOUS

## 2018-06-22 MED ORDER — BIVALIRUDIN TRIFLUOROACETATE 250 MG IV SOLR
INTRAVENOUS | Status: AC
  Filled 2018-06-22: qty 250

## 2018-06-22 MED ORDER — ONDANSETRON HCL 4 MG/2ML IJ SOLN: 4.0000 mg | Freq: Four times a day (QID) | INTRAMUSCULAR | Status: DC | PRN

## 2018-06-22 MED ORDER — SODIUM CHLORIDE 0.9% FLUSH
3.0000 mL | Freq: Two times a day (BID) | INTRAVENOUS | Status: DC
  Administered 2018-06-22 – 2018-06-27 (×9): 3 mL via INTRAVENOUS

## 2018-06-22 MED ORDER — ROSUVASTATIN CALCIUM 20 MG PO TABS
20.0000 mg | ORAL_TABLET | Freq: Every day | ORAL | Status: DC
  Administered 2018-06-22 – 2018-06-27 (×6): 20 mg via ORAL
  Filled 2018-06-22 (×6): qty 1

## 2018-06-22 MED ORDER — ACETAMINOPHEN 500 MG PO TABS: 500.0000 mg | ORAL_TABLET | Freq: Four times a day (QID) | ORAL | Status: DC | PRN

## 2018-06-22 MED ORDER — HEPARIN (PORCINE) IN NACL 1000-0.9 UT/500ML-% IV SOLN
INTRAVENOUS | Status: AC
  Filled 2018-06-22: qty 500

## 2018-06-22 MED ORDER — ACETAMINOPHEN 325 MG PO TABS
650.0000 mg | ORAL_TABLET | ORAL | Status: DC | PRN
  Administered 2018-06-22 – 2018-06-26 (×3): 650 mg via ORAL
  Filled 2018-06-22 (×3): qty 2

## 2018-06-22 MED ORDER — MORPHINE SULFATE (PF) 4 MG/ML IV SOLN
4.0000 mg | Freq: Once | INTRAVENOUS | Status: AC
  Administered 2018-06-22: 4 mg via INTRAVENOUS
  Filled 2018-06-22: qty 1

## 2018-06-22 MED ORDER — ORAL CARE MOUTH RINSE
15.0000 mL | Freq: Two times a day (BID) | OROMUCOSAL | Status: DC
  Administered 2018-06-22 – 2018-06-27 (×10): 15 mL via OROMUCOSAL

## 2018-06-22 MED ORDER — HEPARIN (PORCINE) IN NACL 1000-0.9 UT/500ML-% IV SOLN
INTRAVENOUS | Status: AC
  Filled 2018-06-22: qty 1500

## 2018-06-22 MED ORDER — CARVEDILOL 3.125 MG PO TABS
3.1250 mg | ORAL_TABLET | Freq: Two times a day (BID) | ORAL | Status: DC
  Administered 2018-06-22 – 2018-06-23 (×3): 3.125 mg via ORAL
  Filled 2018-06-22 (×3): qty 1

## 2018-06-22 MED ORDER — IOHEXOL 350 MG/ML SOLN
INTRAVENOUS | Status: DC | PRN
  Administered 2018-06-22: 105 mL via INTRA_ARTERIAL

## 2018-06-22 MED ORDER — SODIUM CHLORIDE 0.9 % IV SOLN: 250.0000 mL | INTRAVENOUS | Status: DC | PRN

## 2018-06-22 MED ORDER — ASPIRIN 81 MG PO TBEC: 81.0000 mg | DELAYED_RELEASE_TABLET | Freq: Every day | ORAL | Status: DC

## 2018-06-22 MED ORDER — ASPIRIN 81 MG PO CHEW: 324.0000 mg | CHEWABLE_TABLET | Freq: Once | ORAL | Status: DC

## 2018-06-22 MED ORDER — MORPHINE SULFATE (PF) 2 MG/ML IV SOLN
2.0000 mg | INTRAVENOUS | Status: DC | PRN
  Administered 2018-06-22: 2 mg via INTRAVENOUS
  Filled 2018-06-22: qty 1

## 2018-06-22 MED ORDER — LIDOCAINE HCL (PF) 1 % IJ SOLN
INTRAMUSCULAR | Status: DC | PRN
  Administered 2018-06-22: 17 mL

## 2018-06-22 MED ORDER — ONDANSETRON HCL 4 MG/2ML IJ SOLN
4.0000 mg | Freq: Four times a day (QID) | INTRAMUSCULAR | Status: DC | PRN
  Administered 2018-06-27: 4 mg via INTRAVENOUS
  Filled 2018-06-22: qty 2

## 2018-06-22 MED ORDER — SODIUM CHLORIDE 0.9% FLUSH
3.0000 mL | INTRAVENOUS | Status: DC | PRN
  Filled 2018-06-22: qty 3

## 2018-06-22 MED ORDER — NITROGLYCERIN 1 MG/10 ML FOR IR/CATH LAB
INTRA_ARTERIAL | Status: AC
  Filled 2018-06-22: qty 10

## 2018-06-22 MED ORDER — TICAGRELOR 90 MG PO TABS
ORAL_TABLET | ORAL | Status: DC | PRN
  Administered 2018-06-22: 180 mg via ORAL

## 2018-06-22 MED ORDER — ATROPINE SULFATE 1 MG/10ML IJ SOSY
PREFILLED_SYRINGE | INTRAMUSCULAR | Status: AC
  Filled 2018-06-22: qty 10

## 2018-06-22 MED ORDER — HEPARIN SODIUM (PORCINE) 5000 UNIT/ML IJ SOLN
4000.0000 [IU] | Freq: Once | INTRAMUSCULAR | Status: AC
  Administered 2018-06-22: 4000 [IU] via INTRAVENOUS
  Filled 2018-06-22: qty 1

## 2018-06-22 MED ORDER — TRAMADOL HCL 50 MG PO TABS
50.0000 mg | ORAL_TABLET | Freq: Three times a day (TID) | ORAL | Status: DC | PRN
  Administered 2018-06-26: 50 mg via ORAL
  Filled 2018-06-22 (×2): qty 1

## 2018-06-22 MED ORDER — TICAGRELOR 90 MG PO TABS
ORAL_TABLET | ORAL | Status: AC
  Filled 2018-06-22: qty 2

## 2018-06-22 MED ORDER — HEPARIN (PORCINE) IN NACL 1000-0.9 UT/500ML-% IV SOLN
INTRAVENOUS | Status: DC | PRN
  Administered 2018-06-22 (×4): 500 mL

## 2018-06-22 MED ORDER — LIDOCAINE HCL (PF) 1 % IJ SOLN
INTRAMUSCULAR | Status: AC
  Filled 2018-06-22: qty 30

## 2018-06-22 SURGICAL SUPPLY — 22 items
BALLN SAPPHIRE 2.5X15 (BALLOONS) ×2
BALLN SAPPHIRE ~~LOC~~ 3.75X8 (BALLOONS) ×2 IMPLANT
BALLN WOLVERINE 2.50X10 (BALLOONS) ×2
BALLOON SAPPHIRE 2.5X15 (BALLOONS) ×1 IMPLANT
BALLOON WOLVERINE 2.50X10 (BALLOONS) ×1 IMPLANT
CATH INFINITI 5FR MULTPACK ANG (CATHETERS) ×2 IMPLANT
CATH LAUNCHER 6FR EBU 4 (CATHETERS) ×2 IMPLANT
GLIDESHEATH SLEND SS 6F .021 (SHEATH) ×2 IMPLANT
GUIDEWIRE INQWIRE 1.5J.035X260 (WIRE) ×1 IMPLANT
INQWIRE 1.5J .035X260CM (WIRE) ×2
KIT ENCORE 26 ADVANTAGE (KITS) ×2 IMPLANT
KIT HEART LEFT (KITS) ×2 IMPLANT
KIT HEMO VALVE WATCHDOG (MISCELLANEOUS) ×2 IMPLANT
PACK CARDIAC CATHETERIZATION (CUSTOM PROCEDURE TRAY) ×2 IMPLANT
SHEATH PINNACLE 6F 10CM (SHEATH) ×2 IMPLANT
SHEATH PROBE COVER 6X72 (BAG) ×2 IMPLANT
STENT SYNERGY DES 3.5X12 (Permanent Stent) ×2 IMPLANT
TRANSDUCER W/STOPCOCK (MISCELLANEOUS) ×2 IMPLANT
TUBING CIL FLEX 10 FLL-RA (TUBING) ×2 IMPLANT
WIRE ASAHI PROWATER 180CM (WIRE) ×4 IMPLANT
WIRE EMERALD 3MM-J .035X150CM (WIRE) ×2 IMPLANT
WIRE FIGHTER CROSSING 190CM (WIRE) ×2 IMPLANT

## 2018-06-22 NOTE — Progress Notes (Signed)
Cardiology Turner paged, patient having increasing multifocal PVC's, K 3.0 on AM labs and not replaced during the day. Awaiting orders.

## 2018-06-22 NOTE — Progress Notes (Addendum)
Progress Note  Patient Name: [name] Date of Encounter: 06/22/2018  Primary Cardiologist: Dr. Ellyn Case   Subjective   No acute events overnight. Reports 2 short episodes of chest pain this AM that resolved after drinking water. Denies shortness of breath.   Inpatient Medications    Scheduled Meds: . amLODipine  5 mg Oral Daily  . aspirin  324 mg Oral Once  . aspirin  81 mg Oral Daily  . chlorthalidone  25 mg Oral Daily  . FLUoxetine  10 mg Oral Daily  . pantoprazole  40 mg Oral Daily  . rosuvastatin  20 mg Oral Daily  . sodium chloride flush  3 mL Intravenous Q12H  . ticagrelor  90 mg Oral BID   Continuous Infusions: . sodium chloride 100 mL/hr (06/22/18 4540)  . sodium chloride     PRN Meds: sodium chloride, acetaminophen, hydrALAZINE, labetalol, nitroGLYCERIN, ondansetron (ZOFRAN) IV, sodium chloride flush, traMADol   Vital Signs    Vitals:   06/22/18 0530 06/22/18 0600 06/22/18 0700 06/22/18 0800  BP: 138/69 128/77 129/88 (!) 150/94  Pulse: 75 81 77 70  Resp: 16 14 18 17   Temp:      TempSrc:      SpO2: 95% 95% 100% 100%  Weight:      Height:       No intake or output data in the 24 hours ending 06/22/18 0818 Filed Weights   06/22/18 0223  Weight: 68 kg    Telemetry    Irregular rhythm, missed beats - Personally Reviewed  ECG    Post cath EKG with ST elevations on inferior leads. Awaiting EKG this AM  - Personally Reviewed  Physical Exam   GEN: elderly female, tired-appearing, in no acute distress.   Neck: No JVD Cardiac: RRR, no murmurs, rubs, or gallops, III/VI systolic murmur best heard in upper sternal borders  Respiratory: Clear to auscultation bilaterally. GI: Soft, nontender, non-distended  MS: No edema; No deformity. Neuro:  Nonfocal  Psych: Normal affect   Labs    Chemistry Recent Labs  Lab 06/22/18 0225  NA 136  K 3.1*  CL 104  CO2 22  GLUCOSE 167*  BUN 25*  CREATININE 2.24*  CALCIUM 10.8*  PROT 7.1  ALBUMIN  4.1  AST 24  ALT 13  ALKPHOS 82  BILITOT 0.6  GFRNONAA 18*  GFRAA 21*  ANIONGAP 10     Hematology Recent Labs  Lab 06/22/18 0225  WBC 8.7  RBC 3.54*  HGB 11.3*  HCT 36.9  MCV 104.2*  MCH 31.9  MCHC 30.6  RDW 12.4  PLT 150    Cardiac Enzymes Recent Labs  Lab 06/22/18 0225 06/22/18 0637  TROPONINI 0.07* 0.87*   No results for input(s): TROPIPOC in the last 168 hours.   BNPNo results for input(s): BNP, PROBNP in the last 168 hours.   DDimer No results for input(s): DDIMER in the last 168 hours.   Radiology    Dg Chest Port 1 View  Result Date: 06/22/2018 CLINICAL DATA:  Chest pain. EXAM: PORTABLE CHEST 1 VIEW COMPARISON:  11/26/2017 FINDINGS: Lungs are adequately inflated without focal airspace consolidation or effusion. Borderline stable cardiomegaly. Remainder of the exam is unchanged. IMPRESSION: No acute cardiopulmonary disease. Electronically Signed   By: [name] M.D.   On: 06/22/2018 02:43    Cardiac Studies   LHC 06/22/2018:   Mid LM to Dist LM lesion is 90% stenosed.  A drug-eluting stent was successfully placed using a STENT  SYNERGY DES 3.5X12.  Post intervention, there is a 0% residual stenosis.  Dist LAD lesion is 100% stenosed.  Balloon angioplasty was performed using a BALLOON SAPPHIRE 2.5X15.  Post intervention, there is a 99% residual stenosis. The lesion behaved like a CTO.  Mid RCA lesion is 100% stenosed.  Previously placed 1st Mrg stent (unknown type) is widely patent.  Mid RCA to Dist RCA lesion is 80% stenosed.  Known severe aortic stenosis. Did not cross the aortic valve.   Recommend uninterrupted dual antiplatelet therapy with Aspirin 81mg  daily and Ticagrelor 90mg  twice daily for a minimum of 12 months (ACS - Class I recommendation).   Inferior ST elevation, likely due to compromised left to right collaterals.  Will have to manage ischemia and persistent inferior ST elevations medically.  She is pain free at this  time.    Severe aortic stenosis.  Unclear if she would be a candidate for TAVR given renal insufficiency and PAD, although, it appears there was no significant right LE PAD, but there was tortuosity.  Watch in ICU.  Continue low dose Angiomax.  Switch Plavix to Brilinta for now.  May need to go back on Plavix later if there are bleeding issues.   Patient Profile     84 y.o. female with PMH of CAD s/p PCI 1st marginal and RCA, severe aortic stenosis with mean gradient of 64, HLD, PAD s/p multiple interventions, bilateral RAS s/p PCI, CVA on ASA and Plavix, h/o DVT, and cerebral aneurysm who presented with chest pain early this AM and was found to have an inferior STEMI.   Assessment & Plan    1. Inferior STEMI      CAD s/p PCI 1st marginal and RCA  [name] presented with chest pain at rest early this morning and was noted to have inferior ST elevations. She underwent heart catheterization around 3AM today which showed 90% stenosis of L main, 100% occlusion of dLAD (chronic), 80% mRCA, and chronic occlusion of mRCA. She is s/p PCI to L main. Her inferior MI likely secondary to compromised L->R collaterals. On low dose Angiomax at this time. Echocardiogram ordered and pending. She is on ASA, Brilinta, statin. Will start low dose Coreg today, HR in the 70s and can tolerate it. Will discontinue amlodipine, she will remain on chlorthalidone for BP control. She continues to have intermittent chest pain that resolved with SL nitro. Will remain in ICU today for close monitoring.   2. Severe aortic stenosis: Most recent echocardiogram on 04/2018 showed a mean gradient of 64 with valve area 0.92 cm2. She is of advance age and has multiple medical comorbidities, but is independent and completely functional at home. Consider evaluation for TAVR.   3. HLD: LDL 85. Continue rosuvastatin 20 mg.  4. PAD: Stable, chronically on ASA and Plavix at home. Now on ASA and Brillinta.   5. History of CVA: No  neurological deficits at this time. Chronically on ASA and Plavix at home. Now on ASA and Brillinta.   6. 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 Cr 2.24 today with baseline of 1.7-1.9. Will hydrate post-cath and continue to monitor.    For questions or updates, please contact [place] Please consult [email or web address] for contact info under      Signed, Welford Roche, MD  06/22/2018, 8:18 AM    I have personally seen and examined this patient. I agree with the assessment and plan as outlined above.  Pt admitted  with ACS this am. Inferior ST elevations on EKG. Her distal left main (which supplies the chronically occluded RCA through collaterals) had a severe stenosis and her distal LAD was occluded. Dr. Irish Lack treated the distal left main with a drug eluting stent. The distal LAD could not be opened. Stable this am.  Will continue DAPT with ASA and Brilinta. Will d/c Norvasc and start low dose Coreg. Continue statin.  She is known to have severe AS. Echo pending today. She may end up being a TAVR candidate but that workup will be delayed to allow her time to recover from her acute MI. Will leave in the ICU today.   Lauree Chandler 06/22/2018 11:23 AM

## 2018-06-22 NOTE — H&P (Addendum)
History and Physical   Patient ID: [name], MRN: [id number], DOB: Jun 19, 1924   Date of Encounter: 06/22/2018, 2:46 AM  Primary Care Provider: Denita Lung, MD Cardiologist: Ellyn Hack  Chief Complaint:  CP/STEMI  History of Present Illness: [name] is a 84 y.o. female w/ h/o CAD s/p multiple PCI, severe AS w/ mean gradient 42mmHg on recent TTE in sept 2019, diffuse PAD s/p multiple interventions, h/o DVT, dyslipidemia, h/o CVA, h/o cerebral aneurysm, bilateral RAS s/p PCI who follows w/ Dr. Glenetta Hew now presents via EMS as inferior STEMI. She tells me that she was watching TV earlier this evening and had sudden onset of a pressure/heavy discomfort in the center and left side of her chest, radiating to her back. She says she has had similar sx before, but this time the discomfort was more severe (7/10) and did not go away. She tried to lay down to go to sleep, but the discomfort persisted. There was no associated nausea, diaphoresis, SOB, palpitations or any other associated sx. She is a very functional lady who still lives at home, functions independently, and is actually raising a 54yo child who lives with her in the home. She still drives and runs all her own errands.   Past Medical History:  Diagnosis Date  . Anemia   . Aortic valve stenosis, senile calcific January 2011; Jan 2017   a. Echo: Normal EF 60-65%, mild aortic stenosis - estimated valve area 1.1 cm ; b. Feb '17: EF 65-70%, no RWMA. Gr 1 DD. Vigorous - dynamic outflow gradient (4.9 m/s). Mod AS (mean Grad 21 mmHg), Mild MS, Mod TR - PAP ~58 mmHg   . Arthritis   . Bilateral renal artery stenosis ([place]) 2006 ([place])   - Right renal artery 60% stenosis March '13  . Carotid artery occlusion   . Cerebral aneurysm without rupture    3 mm; followed by Dr. Arnoldo Morale as well as vascular surgery  . Chronic kidney disease 1964  . Coronary artery disease - history of MI October 2006   Severe RCA lesion -- 2.5 Mm x 12  Mm Vision BMS; January 2007 -. Distal stent stenosis in RCA treated with 2.5 mm x y 12 mm Vision BMS stent distally;  circumflex/OM stenosis -- 2.5 mg 12 mm Mini Vision BMS ;  most recent cath October 11: 100% RCA occlusion with ISR. Left right collaterals. Patent to the extent, the 70% AV groove circumflex. Apical LAD stenosis 70-80 %, diffuse disease; EF 65-70%   . DVT (deep venous thrombosis) ([place])   . Dyslipidemia, goal LDL below 70     no longer on statin  . Fall at home Jan. 22, 2016  . Hypertension    Previously on multiple medications now on only clonidine  . Non-ST elevation MI (NSTEMI) ([place]) 2006, 2011  . Ovarian cancer ([place]) 1963  . Peripheral vascular disease ([place]) 2010   Known distal left SFA occlusion, known right posterior tibial artery occlusion as well as left posterior and anterior tibial artery occlusion.; Most recent Dopplers November 2012: Patent right SFA stent, left ABI 0.56  . Stroke [facility]) Feb. 2015   Carotid artery Doppler show right internal carotid 60-80% stenosis. Previously followed by Dr. Donzetta Kohut    Past Surgical History:  Procedure Laterality Date  . ABDOMINAL HYSTERECTOMY    . APPENDECTOMY    . BLADDER SURGERY     bladder tack  . BREAST LUMPECTOMY Left   . CARDIAC CATHETERIZATION  05/28/2010  EF 65-70%,multi vessel CAD  . CAROTID DOPPLER  02/04/2012; January 2015   Done at [facility]----right internal 60 to 79%,bilateral external patent,bilateral common carotid artery tortuosity present  . CORONARY ANGIOPLASTY WITH STENT PLACEMENT  05/27/2005   RCA - MiniVision BMS 2.5 mm x 12 mm  . CORONARY ANGIOPLASTY WITH STENT PLACEMENT Bilateral 01/23/20072007   Circumflex-OM -- 2.5 mm x 12 mm Mini Vision BMS; RCA distal to previous stent 2.5 mm x 12 mm Mini Vision BMS  . DOPPLER ECHOCARDIOGRAPHY  09/25/2009   ef 60 to 65%,aotric valve mild stenosis 1.1cm^2(VTI)  . FEMORAL ARTERY STENT Right 2006, 2011, 2012   Atherectomy with PTA alone in 2006; April 2000: Right SFA -  overlapping stents (6.0 mm x 170 mm x2 and 6.0 mm x 40 mm); Cutting Balloon PTA of right SFA ISR -- patent by Dopplers and cath in 2012  . ILIAC ARTERY STENT Right 2006  . lower arterial extrem. doppler  07/23/2011   ABI RIGHT .92 ,LEFT ABI .41 ,RGT PROX.SFA STENT 0-49%,RGT SFA  W/O EVIDENCE OF SIGNIFICANT DIAMETER REDUCTION,RGT POPLITEAL ARTERY 50% NARROWING   . LOWER EXTREMITY ANGIOGRAM Left 07/04/2011   Procedure: LOWER EXTREMITY ANGIOGRAM;  Surgeon: Lorretta Harp, MD;  Location: [facility] CATH LAB;  Service: Cardiovascular;  Laterality: Left;  . NM MYOCAR PERF WALL MOTION  10/13/2009   EF 73%  . POPLITEAL ARTERY ANGIOPLASTY  July 04, 2011   Done in the same setting as PTA of ISR in right SFA  . RENAL ARTERY STENT Bilateral 2006   Dr. Deon Pilling; patent as of 2012 cath  . renal doppler  11/13/2011   bil.renal arterial stents patent w/less than 60%,right and left kidneys equal in size  . TRANSTHORACIC ECHOCARDIOGRAM  2/'16; 10/17/2015   a.Vigorous LV function, EF 60-70%. Dynamic outflow tract obstruction. Midcavity obliteration. Peak intracavitary gradient 36 mmHg, moderate AS, Moderate-severely Ca++ MV -> mild MS. b. EF 65-70%, no RWMA. Gr 1 DD. Vigorous - dynamic outflow gradient (4.9 m/s). Mod AS (mean Grad 21 mmHg), Mild MS, Mod TR - PAP ~58 mmHg      Prior to Admission medications   Medication Sig Start Date End Date Taking? Authorizing Provider  acetaminophen (TYLENOL) 500 MG tablet Take 500 mg by mouth every 6 (six) hours as needed for mild pain, moderate pain or headache.     [provider]  amLODipine (NORVASC) 5 MG tablet Take 1 tablet (5 mg total) by mouth daily. Make OV for further refills. 03/02/18   Leonie Man, MD  aspirin 81 MG EC tablet Take 1 tablet (81 mg total) by mouth daily. Swallow whole. 02/18/18   Tysinger, Camelia Eng, PA-C  atorvastatin (LIPITOR) 20 MG tablet TAKE 1 TABLET BY MOUTH ONCE DAILY 03/16/18   Leonie Man, MD  chlorthalidone (HYGROTON) 25 MG  tablet Take 1 tablet (25 mg total) by mouth daily. 06/03/18   Lendon Colonel, NP  clopidogrel (PLAVIX) 75 MG tablet Take 1 tablet (75 mg total) by mouth daily. Please keep upcoming appointment for further refills. 04/09/18   Leonie Man, MD  FLUAD 0.5 ML SUSY ADM 0.5ML IM UTD 04/30/18   [provider]  FLUoxetine (PROZAC) 10 MG capsule TAKE 1 CAPSULE(10 MG) BY MOUTH DAILY 06/01/18   Tysinger, Camelia Eng, PA-C  NEOMYCIN-POLYMYXIN-HYDROCORTISONE (CORTISPORIN) 1 % SOLN OTIC solution Apply 1-2 drops to toe BID after soaking 05/07/18   Hyatt, Max T, DPM  OVER THE COUNTER MEDICATION Take 1 capsule by mouth daily as needed (  constipation). STOOL SOFTNER    [provider]  pantoprazole (PROTONIX) 40 MG tablet TAKE 1 TABLET BY MOUTH DAILY 45 MINUTES BEFORE BREAKFAST 04/13/18   Tysinger, Camelia Eng, PA-C  potassium chloride 20 MEQ/15ML (10%) SOLN TK 15 MLS PO BID 04/29/18   [provider]  potassium chloride 20 MEQ/15ML (10%) SOLN TAKE 15 MLS BY MOUTH TWICE DAILY 06/04/18   Tysinger, Camelia Eng, PA-C  Propylene Glycol-Glycerin (SOOTHE OP) Place 1 drop into both eyes 2 (two) times daily.     [provider]  rosuvastatin (CRESTOR) 20 MG tablet TAKE 1 TABLET(20 MG) BY MOUTH AT BEDTIME 05/18/18   Tysinger, Camelia Eng, PA-C  traMADol (ULTRAM) 50 MG tablet Take 1 tablet (50 mg total) by mouth every 8 (eight) hours as needed. 06/15/18   Trula Slade, DPM     Allergies: Allergies  Allergen Reactions  . Codeine Rash    Social History:  The patient  reports that she has never smoked. She has never used smokeless tobacco. She reports that she does not drink alcohol or use drugs.   Family History:  The patient's family history includes Alcoholism in her brother and sister; Alzheimer's disease in her sister; Heart attack in her daughter and father; Irritable bowel syndrome in her daughter; Ovarian cancer in her mother.   ROS:  Please see the history of present illness.     All other  systems reviewed and negative.   Vital Signs: Blood pressure 131/79, pulse 85, temperature 98.6 F (37 C), temperature source Temporal, resp. rate 16, height 5\' 5"  (1.651 m), weight 68 kg, SpO2 97 %.  PHYSICAL EXAM: General:  Well nourished, well developed, in mild distress (5/10 pain) HEENT: normal Lymph: no adenopathy Neck: no JVD Endocrine:  No thryomegaly Vascular: No carotid bruits; DP pulses not palpable  Cardiac:  normal S1, S2; RRR; 2/6 sys murmur at the base  Lungs:  clear to auscultation bilaterally, no wheezing, rhonchi or rales  Abd: soft, nontender, no hepatomegaly  Ext: no edema Musculoskeletal:  No deformities, BUE and BLE strength normal and equal Skin: warm and dry  Neuro:  CNs 2-12 intact, no focal abnormalities noted Psych:  Normal affect   EKG:  NSR with 1-67mm ST elevation in inferior leads; Q in III  Labs:   Lab Results  Component Value Date   WBC 8.7 06/22/2018   HGB 11.3 (L) 06/22/2018   HCT 36.9 06/22/2018   MCV 104.2 (H) 06/22/2018   PLT 150 06/22/2018   No results for input(s): NA, K, CL, CO2, BUN, CREATININE, CALCIUM, PROT, BILITOT, ALKPHOS, ALT, AST, GLUCOSE in the last 168 hours.  Invalid input(s): LABALBU No results for input(s): CKTOTAL, CKMB, TROPONINI in the last 72 hours. Troponin (Point of Care Test) No results for input(s): TROPIPOC in the last 72 hours.  Lab Results  Component Value Date   CHOL 182 02/17/2018   HDL 46 02/17/2018   LDLCALC 109 (H) 02/17/2018   TRIG 134 02/17/2018   No results found for: DDIMER  Radiology/Studies:  Dg Chest Port 1 View  Result Date: 06/22/2018 CLINICAL DATA:  Chest pain. EXAM: PORTABLE CHEST 1 VIEW COMPARISON:  11/26/2017 FINDINGS: Lungs are adequately inflated without focal airspace consolidation or effusion. Borderline stable cardiomegaly. Remainder of the exam is unchanged. IMPRESSION: No acute cardiopulmonary disease. Electronically Signed   By: Marin Olp M.D.   On: 06/22/2018 02:43    TTE 05/04/18 Left ventricle: The cavity size was normal. Wall thickness was   increased  in a pattern of moderate LVH. Systolic function was   vigorous. The estimated ejection fraction was in the range of 65%   to 70%. Features are consistent with a pseudonormal left   ventricular filling pattern, with concomitant abnormal relaxation   and increased filling pressure (grade 2 diastolic dysfunction).   GLS -14.2%. There was an LV mid cavity gradient, peak 49 mmHg. - Aortic valve: Trileaflet; severely calcified leaflets. There was   severe stenosis. Mean gradient (S): 64 mm Hg. Peak gradient (S):   102 mm Hg. Valve area (VTI): 0.92 cm^2. - Mitral valve: Moderately to severely calcified annulus.   Moderately calcified leaflets . The findings are consistent with   moderate stenosis. There was trivial regurgitation. Mean gradient   (D): 8 mm Hg. Valve area by pressure half-time: 1.71 cm^2. - Left atrium: The atrium was mildly dilated. - Right ventricle: The cavity size was normal. Systolic function   was normal. - Tricuspid valve: Peak RV-RA gradient (S): 82 mm Hg. - Pulmonary arteries: PA peak pressure: 85 mm Hg (S). - Inferior vena cava: The vessel was normal in size. The   respirophasic diameter changes were in the normal range (= 50%),   consistent with normal central venous pressure.  Impressions:  - Normal LV size with moderate LV hypertrophy. EF 65-70%, vigorous   systolic function. LV mid-cavity gradient to 49 mmHg. Moderate   diastolic dysfunction. Normal RV size and systolic function.   Severe aortic stenosis. Moderate mitral stenosis. Markedly   calcified aortic and mitral valves. Severe pulmonary   hypertension.    ASSESSMENT AND PLAN:   1. STEMI: pt has h/o chronically occluded RCA w/ L-R collaterals, h/o PCI to LCx. Last cath was 2011 at which time the RCA was occluded as aforementioned, anda the LCx had 70% lesion in the AV groove. LAD had apical 70-80% lesion and was  diffusely diseased. Will do LHC to look for any culprit lesion that may be amenable to revascularization. O/w cont current medical tx She had recent TTE sept 2019; can order limited repeat for LV function   2. Dyslipidemia: last LDL was 109; goal LDL is <70. She is on low-dose atorva currently. If LDL remains elevated, [id number] could be a consideration although not clear how much benefit she will derive from it at her age  54. AS: severe, with most recent mean gradient 66mmHg. She probably needs evaluation by TAVR team. That workup could be during this hospitalization  4. PAD: stable  5. H/o CVA: she has been on ASA/Plavix chronically due to h/o CVA. Stable from neuro standpoint   Signed,  Rudean Curt, MD, [facility]  06/22/2018 2:46 AM   I have examined the patient and reviewed assessment and plan and discussed with patient.  Agree with above as stated.  I personally reviewed the ECG and made the decision to bring the patient for emergent cardiac cath, since she is quite functional despite her advanced age.   Cath showed an occluded RCA, occluded distal LAD and severe left main disease.  LAD was ballooned distally, but this appeared to be a CTO.  THere was slight improvement in flow transiently.    The distal left main was stented with a 3.5 x 12 Synergy stent, postdilated to 3.8 mm, extending into the proximal LAD.  TIMI 3 flow was maintained in the circumflex.  She tolerated the procedure well.  I think this was an inferior MI since her left to right collaterals were compromised.  She  has severe AS as well.  CRI- hydrate post cath.  Larae Grooms

## 2018-06-22 NOTE — Progress Notes (Signed)
Initial Nutrition Assessment  DOCUMENTATION CODES:   Not applicable  INTERVENTION:    Consider diet liberalization to Regular; pt declined supplements   NUTRITION DIAGNOSIS:   Increased nutrient needs related to acute illness as evidenced by estimated needs  GOAL:   Patient will meet greater than or equal to 90% of their needs  MONITOR:   PO intake, Labs, Skin, Weight trends, I & O's  REASON FOR ASSESSMENT:   Malnutrition Screening Tool  ASSESSMENT:   84 yo Female w/ h/o CAD s/p multiple PCI, severe AS, diffuse PAD s/p multiple interventions, h/o DVT, dyslipidemia, h/o CVA, h/o cerebral aneurysm, bilateral RAS s/p PCI who follows w/ Dr. Glenetta Hew now presents via EMS as inferior STEMI.  Pt s/p procedure today: CARDIAC CATHETERIZATION  Cath revealed occluded RCA, occluded distal LAD and severe left main disease.  RD spoke with patient at bedside. She is pleasant. Pt reports her appetite is good, however, didn't like her breakfast. States she does not drink any oral nutrition supplements at home.  She believes she's lost weight, however, unable to quantify amount or time frame. Pt does wear dentures, but currently not in place. Says they are in her bathroom. Labs & medications reviewed. K 3.1 (L).  NUTRITION - FOCUSED PHYSICAL EXAM:    Most Recent Value  Orbital Region  No depletion  Upper Arm Region  Mild depletion  Thoracic and Lumbar Region  Unable to assess  Buccal Region  No depletion  Temple Region  Mild depletion  Clavicle Bone Region  No depletion  Clavicle and Acromion Bone Region  No depletion  Scapular Bone Region  Unable to assess  Dorsal Hand  Unable to assess  Patellar Region  Mild depletion  Anterior Thigh Region  Mild depletion  Posterior Calf Region  Mild depletion  Edema (RD Assessment)  None     Diet Order:   Diet Order            Diet Heart Room service appropriate? Yes; Fluid consistency: Thin  Diet effective now              EDUCATION NEEDS:   Not appropriate for education at this time  Skin:  Skin Assessment: Reviewed RN Assessment  Last BM:  10/27   Intake/Output Summary (Last 24 hours) at 06/22/2018 1439 Last data filed at 06/22/2018 1400 Gross per 24 hour  Intake 625.29 ml  Output 50 ml  Net 575.29 ml   Height:   Ht Readings from Last 1 Encounters:  06/22/18 5\' 5"  (1.651 m)   Weight:   Wt Readings from Last 1 Encounters:  06/22/18 68 kg   BMI:  Body mass index is 24.96 kg/m.  Estimated Nutritional Needs:   Kcal:  1500-1700  Protein:  85-100 gm  Fluid:  per MD  Arthur Holms, RD, LDN Pager #: [phone] After-Hours Pager #: [phone]

## 2018-06-22 NOTE — ED Triage Notes (Signed)
Patient arrived with EMS from home reports left chest pain radiating to mid back onset this evening , she received 324 mg ASA and 1 NTG sl by EMS with mild relief , rates pain 6/10 at arrival .

## 2018-06-22 NOTE — Care Management (Signed)
WHO SPOKE WITH/ COMPANY/ PHONE NUMBER: [name]  MEDICATION/ DOSE: [name] 90mg  BID  AUTH REQUIRED? IF SO, PHONE NUMBER: No  COVERED?: Yes, tier 3  CO-PAY FOR 30 DAY SUPPLY AT RETAIL PHARMACY: Name Brand: $45   CO-PAY FOR 90 DAY SUPPLY AT [facility]: Name Brand: $125 (3 month supply) Name Brand: $85 (2 month supply)   ALTERNATIVES IF MEDICATION NOT COVERED:   ADDITIONAL NOTES: Generic not available

## 2018-06-22 NOTE — Progress Notes (Signed)
Patient complained of new/worsening chest pain. SL Nitro given, EKG performed. Paged cardiology team. They requested to round prior to pulling sheath. RN continues to check site q12min.

## 2018-06-22 NOTE — ED Notes (Signed)
Patient transported to cath lab

## 2018-06-22 NOTE — Care Management Note (Signed)
Case Management Note  Patient Details  Name: LILO WALLINGTON MRN: [id number] Date of Birth: 1924-03-30  Subjective/Objective:   84yo female presented with CP; s/p STEMI with PCI.            Action/Plan: CM met with patient and her granddaughter to discuss transitional needs. Patient lives at home with her granddaughter, independent with ADLs, still drives, with no DME in use. PCP confirmed as: Dr.John Careers adviser; pharmacy of choice: [facility], [place]. Brilinta benefits check completed with est monthly cost $45; CM informed patient and provided the Brilinta free 30-day supply card. CM discussed [place] service, with patient requesting to use for her DC Rx. Patient indicated her family would provide transportation home. CM will continue to follow.   Expected Discharge Date:                  Expected Discharge Plan:  Home/Self Care  In-House Referral:  NA  Discharge planning Services  CM Consult, Medication Assistance(Brilinta benefits check with card provided)  Post Acute Care Choice:  NA Choice offered to:  NA  DME Arranged:  N/A DME Agency:  NA  HH Arranged:  NA HH Agency:  NA  Status of Service:  In process, will continue to follow  If discussed at Long Length of Stay Meetings, dates discussed:    Additional Comments:  Midge Minium RN, BSN, NCM-BC, ACM-RN [phone] 06/22/2018, 3:37 PM

## 2018-06-22 NOTE — Progress Notes (Signed)
  Echocardiogram 2D Echocardiogram has been performed.  [name] 06/22/2018, 9:39 AM

## 2018-06-22 NOTE — Progress Notes (Signed)
Sheath removed at 1102 with 2 RNs present. Pressure held for approximately 30 minutes. Site clean dry and intact. Level 0.

## 2018-06-22 NOTE — ED Provider Notes (Signed)
[place] EMERGENCY DEPARTMENT Provider Note   CSN: [id number] Arrival date & time: 06/22/18  0212     History   Chief Complaint No chief complaint on file.   HPI [name] is a 84 y.o. female.  The history is provided by the patient and the EMS personnel.  She has a history of hypertension, hyperlipidemia, coronary artery disease, chronic kidney disease, aortic stenosis, renal artery stenosis, ovarian cancer, stroke and comes in with chest pain which started at 9 PM.  Pain is in the mid chest and radiating to the back of.  She rates pain a 7/10.  She denies dyspnea or diaphoresis.  There was no nausea, but she forced herself to vomit without any relief.  She was brought in by ambulance and was given aspirin and nitroglycerin in the ambulance without any relief.  EMS noted ST elevation and activated code STEMI.  Per patient, nothing makes her pain better and nothing makes it worse.  She is unable to characterize the pain other than by a number.  Past Medical History:  Diagnosis Date  . Anemia   . Aortic valve stenosis, senile calcific January 2011; Jan 2017   a. Echo: Normal EF 60-65%, mild aortic stenosis - estimated valve area 1.1 cm ; b. Feb '17: EF 65-70%, no RWMA. Gr 1 DD. Vigorous - dynamic outflow gradient (4.9 m/s). Mod AS (mean Grad 21 mmHg), Mild MS, Mod TR - PAP ~58 mmHg   . Arthritis   . Bilateral renal artery stenosis ([place]) 2006 ([place])   - Right renal artery 60% stenosis March '13  . Carotid artery occlusion   . Cerebral aneurysm without rupture    3 mm; followed by Dr. Arnoldo Morale as well as vascular surgery  . Chronic kidney disease 1964  . Coronary artery disease - history of MI October 2006   Severe RCA lesion -- 2.5 Mm x 12 Mm Vision BMS; January 2007 -. Distal stent stenosis in RCA treated with 2.5 mm x y 12 mm Vision BMS stent distally;  circumflex/OM stenosis -- 2.5 mg 12 mm Mini Vision BMS ;  most recent cath October 11: 100% RCA  occlusion with ISR. Left right collaterals. Patent to the extent, the 70% AV groove circumflex. Apical LAD stenosis 70-80 %, diffuse disease; EF 65-70%   . DVT (deep venous thrombosis) ([place])   . Dyslipidemia, goal LDL below 70     no longer on statin  . Fall at home Jan. 22, 2016  . Hypertension    Previously on multiple medications now on only clonidine  . Non-ST elevation MI (NSTEMI) ([place]) 2006, 2011  . Ovarian cancer ([place]) 1963  . Peripheral vascular disease ([place]) 2010   Known distal left SFA occlusion, known right posterior tibial artery occlusion as well as left posterior and anterior tibial artery occlusion.; Most recent Dopplers November 2012: Patent right SFA stent, left ABI 0.56  . Stroke [facility]) Feb. 2015   Carotid artery Doppler show right internal carotid 60-80% stenosis. Previously followed by Dr. Donzetta Kohut    Patient Active Problem List   Diagnosis Date Noted  . Abdominal pain 04/13/2018  . Aortic atherosclerosis ([place]) 04/13/2018  . Renal calculi 04/13/2018  . Hiatal hernia 04/13/2018  . Calculus of gallbladder without cholecystitis without obstruction 04/13/2018  . Anxiety 11/06/2017  . Caregiver burden 11/06/2017  . Yeast vaginitis 11/06/2017  . Burning with urination 11/06/2017  . PAD (peripheral artery disease) ([place]) 11/25/2016  . Essential hypertension 09/24/2013  .  CVA (cerebral infarction) 09/24/2013  . History of MI (myocardial infarction)   . Dyslipidemia, goal LDL below 70   . CKD (chronic kidney disease) stage 3, GFR 30-59 ml/min ([facility]) 09/23/2012  . Left carotid stenosis 02/04/2012  . Benign neoplasm of colon 10/10/2011  . Aortic valve stenosis, senile calcific 08/26/2009  . Coronary artery disease involving native heart with angina pectoris ([place]) 05/26/2005    Past Surgical History:  Procedure Laterality Date  . ABDOMINAL HYSTERECTOMY    . APPENDECTOMY    . BLADDER SURGERY     bladder tack  . BREAST LUMPECTOMY Left   . CARDIAC CATHETERIZATION   05/28/2010   EF 65-70%,multi vessel CAD  . CAROTID DOPPLER  02/04/2012; January 2015   Done at [facility]----right internal 60 to 79%,bilateral external patent,bilateral common carotid artery tortuosity present  . CORONARY ANGIOPLASTY WITH STENT PLACEMENT  05/27/2005   RCA - MiniVision BMS 2.5 mm x 12 mm  . CORONARY ANGIOPLASTY WITH STENT PLACEMENT Bilateral 01/23/20072007   Circumflex-OM -- 2.5 mm x 12 mm Mini Vision BMS; RCA distal to previous stent 2.5 mm x 12 mm Mini Vision BMS  . DOPPLER ECHOCARDIOGRAPHY  09/25/2009   ef 60 to 65%,aotric valve mild stenosis 1.1cm^2([facility])  . FEMORAL ARTERY STENT Right 2006, 2011, 2012   Atherectomy with PTA alone in 2006; April 2000: Right SFA - overlapping stents (6.0 mm x 170 mm x2 and 6.0 mm x 40 mm); [place]oon PTA of right SFA ISR -- patent by Dopplers and cath in 2012  . ILIAC ARTERY STENT Right 2006  . lower arterial extrem. doppler  07/23/2011   ABI RIGHT .92 ,LEFT ABI .20 ,RGT PROX.SFA STENT 0-49%,RGT SFA  W/O EVIDENCE OF SIGNIFICANT DIAMETER REDUCTION,RGT POPLITEAL ARTERY 50% NARROWING   . LOWER EXTREMITY ANGIOGRAM Left 07/04/2011   Procedure: LOWER EXTREMITY ANGIOGRAM;  Surgeon: Lorretta Harp, MD;  Location[facility] C[facility] LAB;  Service: Cardiovascular;  Laterality: Left;  . NM MYOCAR PERF WALL MOTION  10/13/2009   EF 73%  . POPLITEAL ARTERY ANGIOPLASTY  July 04, 2011   Done in the same setting as PTA of ISR in right SFA  . RENAL ARTERY STENT Bilateral 2006   Dr. Deon Pilling; patent as of 2012 cath  . renal doppler  11/13/2011   bil.renal arterial stents patent w/less than 60%,right and left kidneys equal in size  . TRANSTHORACIC ECHOCARDIOGRAM  2/'16; 10/17/2015   a.Vigorous LV function, EF 60-70%. Dynamic outflow tract obstruction. Midcavity obliteration. Peak intracavitary gradient 36 mmHg, moderate AS, Moderate-severely Ca++ MV -> mild MS. b. EF 65-70%, no RWMA. Gr 1 DD. Vigorous - dynamic outflow gradient (4.9 m/s). Mod AS (mean Grad 21 mmHg),  Mild MS, Mod TR - PAP ~58 mmHg      OB History   None      Home Medications    Prior to Admission medications   Medication Sig Start Date End Date Taking? Authorizing Provider  acetaminophen (TYLENOL) 500 MG tablet Take 500 mg by mouth every 6 (six) hours as needed for mild pain, moderate pain or headache.     [provider]  amLODipine (NORVASC) 5 MG tablet Take 1 tablet (5 mg total) by mouth daily. Make OV for further refills. 03/02/18   Leonie Man, MD  aspirin 81 MG EC tablet Take 1 tablet (81 mg total) by mouth daily. Swallow whole. 02/18/18   Tysinger, Camelia Eng, PA-C  atorvastatin (LIPITOR) 20 MG tablet TAKE 1 TABLET BY MOUTH ONCE DAILY 03/16/18  Leonie Man, MD  chlorthalidone (HYGROTON) 25 MG tablet Take 1 tablet (25 mg total) by mouth daily. 06/03/18   Lendon Colonel, NP  clopidogrel (PLAVIX) 75 MG tablet Take 1 tablet (75 mg total) by mouth daily. Please keep upcoming appointment for further refills. 04/09/18   Leonie Man, MD  FLUAD 0.5 ML SUSY ADM 0.5ML IM UTD 04/30/18   [provider]  FLUoxetine (PROZAC) 10 MG capsule TAKE 1 CAPSULE(10 MG) BY MOUTH DAILY 06/01/18   Tysinger, Camelia Eng, PA-C  NEOMYCIN-POLYMYXIN-HYDROCORTISONE (CORTISPORIN) 1 % SOLN OTIC solution Apply 1-2 drops to toe BID after soaking 05/07/18   Hyatt, Max T, DPM  OVER THE COUNTER MEDICATION Take 1 capsule by mouth daily as needed (constipation). STOOL SOFTNER    [provider]  pantoprazole (PROTONIX) 40 MG tablet TAKE 1 TABLET BY MOUTH DAILY 45 MINUTES BEFORE BREAKFAST 04/13/18   Tysinger, Camelia Eng, PA-C  potassium chloride 20 MEQ/15ML (10%) SOLN TK 15 MLS PO BID 04/29/18   [provider]  potassium chloride 20 MEQ/15ML (10%) SOLN TAKE 15 MLS BY MOUTH TWICE DAILY 06/04/18   Tysinger, Camelia Eng, PA-C  Propylene Glycol-Glycerin (SOOTHE OP) Place 1 drop into both eyes 2 (two) times daily.     [provider]  rosuvastatin (CRESTOR) 20 MG tablet TAKE 1  TABLET(20 MG) BY MOUTH AT BEDTIME 05/18/18   Tysinger, Camelia Eng, PA-C  traMADol (ULTRAM) 50 MG tablet Take 1 tablet (50 mg total) by mouth every 8 (eight) hours as needed. 06/15/18   Trula Slade, DPM    Family History Family History  Problem Relation Age of Onset  . Ovarian cancer Mother   . Heart attack Father   . Irritable bowel syndrome Daughter   . Alcoholism Brother   . Alcoholism Sister   . Alzheimer's disease Sister   . Heart attack Daughter   . Colon cancer Neg Hx     Social History Social History   Tobacco Use  . Smoking status: Never Smoker  . Smokeless tobacco: Never Used  Substance Use Topics  . Alcohol use: No    Comment: social drinker  years ago-no longer  . Drug use: No     Allergies   Codeine   Review of Systems Review of Systems  All other systems reviewed and are negative.    Physical Exam Updated Vital Signs BP 131/79 (BP Location: Right Arm)   Pulse 85   Temp 98.6 F (37 C) (Temporal)   Resp 16   Ht 5\' 5"  (1.651 m)   Wt 68 kg   SpO2 97%   BMI 24.96 kg/m   Physical Exam  Nursing note and vitals reviewed.  84 year old female, resting comfortably and in no acute distress. Vital signs are normal. Oxygen saturation is 97%, which is normal. Head is normocephalic and atraumatic. PERRLA, EOMI. Oropharynx is clear. Neck is nontender and supple without adenopathy or JVD. Back is nontender and there is no CVA tenderness. Lungs are clear without rales, wheezes, or rhonchi. Chest is nontender. Heart has regular rate and rhythm without murmur. Abdomen is soft, flat, nontender without masses or hepatosplenomegaly and peristalsis is normoactive. Extremities have no cyanosis or edema, full range of motion is present. Skin is warm and dry without rash. Neurologic: Mental status is normal, cranial nerves are intact, there are no motor or sensory deficits.  ED Treatments / Results  Labs (all labs ordered are listed, but only abnormal results  are displayed) Labs Reviewed  CBC WITH DIFFERENTIAL/PLATELET - Abnormal; Notable for the following components:      Result Value   RBC 3.54 (*)    Hemoglobin 11.3 (*)    MCV 104.2 (*)    All other components within normal limits  COMPREHENSIVE METABOLIC PANEL - Abnormal; Notable for the following components:   Potassium 3.1 (*)    Glucose, Bld 167 (*)    BUN 25 (*)    Creatinine, Ser 2.24 (*)    Calcium 10.8 (*)    GFR calc non Af Amer 18 (*)    GFR calc Af Amer 21 (*)    All other components within normal limits  TROPONIN I - Abnormal; Notable for the following components:   Troponin I 0.07 (*)    All other components within normal limits  TROPONIN I - Abnormal; Notable for the following components:   Troponin I 0.87 (*)    All other components within normal limits  TROPONIN I - Abnormal; Notable for the following components:   Troponin I 17.51 (*)    All other components within normal limits  TROPONIN I - Abnormal; Notable for the following components:   Troponin I 42.62 (*)    All other components within normal limits  POCT I-STAT 3, ART BLOOD GAS (G3+) - Abnormal; Notable for the following components:   pO2, Arterial 70.0 (*)    All other components within normal limits  POCT I-STAT, CHEM 8 - Abnormal; Notable for the following components:   Potassium 3.0 (*)    BUN 26 (*)    Creatinine, Ser 2.30 (*)    Glucose, Bld 178 (*)    All other components within normal limits  MRSA PCR SCREENING  PROTIME-INR  APTT  LIPID PANEL  BASIC METABOLIC PANEL  CBC  POCT ACTIVATED CLOTTING TIME  POCT ACTIVATED CLOTTING TIME    EKG EKG Interpretation  Date/Time:  Monday June 22 2018 02:28:44 EDT Ventricular Rate:  83 PR Interval:    QRS Duration: 82 QT Interval:  402 QTC Calculation: 473 R Axis:   12 Text Interpretation:  Sinus rhythm Supraventricular bigeminy Short PR interval Low voltage, precordial leads Abnormal R-wave progression, early transition Borderline  repolarization abnormality ST elevation, consider inferior injury When compared with ECG of 09/02/2016, ST elevation in Inferior leads and anterior leads is now present consistent with ** ** ACUTE MI / STEMI ** ** Premature atrial complexes are now present Confirmed by Delora Fuel ([id number]) on 06/22/2018 2:35:06 AM   Radiology Dg Chest Port 1 View  Result Date: 06/22/2018 CLINICAL DATA:  Chest pain. EXAM: PORTABLE CHEST 1 VIEW COMPARISON:  11/26/2017 FINDINGS: Lungs are adequately inflated without focal airspace consolidation or effusion. Borderline stable cardiomegaly. Remainder of the exam is unchanged. IMPRESSION: No acute cardiopulmonary disease. Electronically Signed   By: Marin Olp M.D.   On: 06/22/2018 02:43    Procedures Procedures  CRITICAL CARE Performed by: Delora Fuel Total critical care time: 35 minutes Critical care time was exclusive of separately billable procedures and treating other patients. Critical care was necessary to treat or prevent imminent or life-threatening deterioration. Critical care was time spent personally by me on the following activities: development of treatment plan with patient and/or surrogate as well as nursing, discussions with consultants, evaluation of patient's response to treatment, examination of patient, obtaining history from patient or surrogate, ordering and performing treatments and interventions, ordering and review of laboratory studies, ordering and review of radiographic studies, pulse oximetry and re-evaluation of patient's condition.  Medications Ordered in ED Medications  0.9 %  sodium chloride infusion ( Intravenous New Bag/Given 06/22/18 0232)  aspirin chewable tablet 324 mg (has no administration in time range)  heparin injection 4,000 Units (4,000 Units Intravenous Given 06/22/18 0232)  morphine 4 MG/ML injection 4 mg (4 mg Intravenous Given 06/22/18 0232)     Initial Impression / Assessment and Plan / ED Course  I have  reviewed the triage vital signs and the nursing notes.  Pertinent labs & imaging results that were available during my care of the patient were reviewed by me and considered in my medical decision making (see chart for details).  Chest pain and patient with known history of coronary artery disease.  EMS ECG does show ST elevation in inferior leads along with loss of R wave in leads III and aVF compared with recent ECG.  ECG in the ED shows similar changes as well as some ST elevation in anterior leads.  Cardiology fellow is here evaluating the patient and is in discussion with STEMI cardiologist regarding whether patient is a candidate to go to catheterization lab.  Patient states that she does want to be resuscitated if necessary, but does not want to be on a ventilator for more than 2 days.  Old records are reviewed, and she does have a long-standing history of coronary artery disease with stent placement, but last catheterization I can find is from 2007.  In the meantime, she is given morphine for pain and is started on heparin.  Final Clinical Impressions(s) / ED Diagnoses   Final diagnoses:  ST elevation myocardial infarction (STEMI), unspecified artery [facility])    ED Discharge Orders    None       Delora Fuel, MD 59/29/24 2253

## 2018-06-22 NOTE — Progress Notes (Signed)
CRITICAL VALUE ALERT  Critical Value:  Troponin 17.51  Date & Time Notied: 06/22/18 @ 1353  Provider Notified: Harlan Stains, NP  Orders Received/Actions taken: Expected value.   Mendel Ryder made aware of patient's persistent chest pain 5/10. Per NP: PRN morphine will be ordered and continue to monitor patient.

## 2018-06-23 ENCOUNTER — Ambulatory Visit: Payer: Medicare Other | Admitting: Cardiovascular Disease

## 2018-06-23 DIAGNOSIS — I350 Nonrheumatic aortic (valve) stenosis: Secondary | ICD-10-CM

## 2018-06-23 DIAGNOSIS — I2109 ST elevation (STEMI) myocardial infarction involving other coronary artery of anterior wall: Secondary | ICD-10-CM

## 2018-06-23 LAB — CBC
HEMATOCRIT: 33.8 % — AB (ref 36.0–46.0)
HEMOGLOBIN: 11 g/dL — AB (ref 12.0–15.0)
MCH: 32.6 pg (ref 26.0–34.0)
MCHC: 32.5 g/dL (ref 30.0–36.0)
MCV: 100.3 fL — AB (ref 80.0–100.0)
Platelets: 147 10*3/uL — ABNORMAL LOW (ref 150–400)
RBC: 3.37 MIL/uL — ABNORMAL LOW (ref 3.87–5.11)
RDW: 12.4 % (ref 11.5–15.5)
WBC: 10.4 10*3/uL (ref 4.0–10.5)
nRBC: 0 % (ref 0.0–0.2)

## 2018-06-23 LAB — BASIC METABOLIC PANEL
Anion gap: 13 (ref 5–15)
BUN: 22 mg/dL (ref 8–23)
CHLORIDE: 99 mmol/L (ref 98–111)
CO2: 24 mmol/L (ref 22–32)
Calcium: 10.9 mg/dL — ABNORMAL HIGH (ref 8.9–10.3)
Creatinine, Ser: 1.88 mg/dL — ABNORMAL HIGH (ref 0.44–1.00)
GFR calc Af Amer: 26 mL/min — ABNORMAL LOW (ref >60)
GFR calc non Af Amer: 22 mL/min — ABNORMAL LOW (ref >60)
GLUCOSE: 148 mg/dL — AB (ref 70–99)
POTASSIUM: 3 mmol/L — AB (ref 3.5–5.1)
Sodium: 136 mmol/L (ref 135–145)

## 2018-06-23 LAB — GLUCOSE, CAPILLARY: Glucose-Capillary: 155 mg/dL — ABNORMAL HIGH (ref 70–99)

## 2018-06-23 MED ORDER — POTASSIUM CHLORIDE 20 MEQ PO PACK
40.0000 meq | PACK | Freq: Once | ORAL | Status: AC
  Administered 2018-06-23: 40 meq via ORAL
  Filled 2018-06-23: qty 2

## 2018-06-23 MED ORDER — POTASSIUM CHLORIDE CRYS ER 20 MEQ PO TBCR
40.0000 meq | EXTENDED_RELEASE_TABLET | Freq: Once | ORAL | Status: DC
  Filled 2018-06-23: qty 2

## 2018-06-23 MED ORDER — CARVEDILOL 6.25 MG PO TABS
6.2500 mg | ORAL_TABLET | Freq: Two times a day (BID) | ORAL | Status: DC
  Administered 2018-06-23 – 2018-06-24 (×2): 6.25 mg via ORAL
  Filled 2018-06-23 (×2): qty 1

## 2018-06-23 MED FILL — Nitroglycerin IV Soln 100 MCG/ML in D5W: INTRA_ARTERIAL | Qty: 10 | Status: AC

## 2018-06-23 NOTE — Progress Notes (Signed)
EKG CRITICAL VALUE     12 lead EKG performed.  Critical value noted.  Brandon Melnick, RN notified.   Verdia Bolt H, CCT 06/23/2018 7:09 AM

## 2018-06-23 NOTE — Progress Notes (Signed)
CARDIAC REHAB PHASE I   PRE:  Rate/Rhythm: 96 SR PVCs  BP:  Supine: 113/73  Sitting:   Standing:    SaO2: 97%RA  MODE:  Ambulation: 290 ft   POST:  Rate/Rhythm: 119 ST PVCs  BP:  Supine:   Sitting: 96/68  Standing:    SaO2: 100%RA 1320-1440 Pt walked 290 ft on RA with gait belt use, rolling walker and asst x 1. One asst in case needed. Pt stopped several times to rest and once leaned on walker. Offered to get pt a chair but she declined. No c/o CP but visibly tired after walk. To recliner. Pt sat for a few minutes and then requested to go to bathroom. After bathroom trip, pt requested going to bed. Assisted to side and pt asleep quickly. Put on bed alarm. While pt was in chair I reviewed MI restrictions, NTG use, importance of brilinta and left heart healthy diet for pt. Discussed CRP 2 and pt stated she might want to try later. Referred to [place] program. 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 Pt exhausted after this walk and bathroom trip.   Graylon Good, RN BSN  06/23/2018 2:35 PM

## 2018-06-23 NOTE — Progress Notes (Signed)
Patients potassium 3.0 on AM labs. Cardiology, [place], paged. Awaiting orders.

## 2018-06-23 NOTE — Progress Notes (Addendum)
Cardiology, Glenford Peers, paged regarding ST segment elevation in anterior leads on morning EKG. Will continue to closely monitor and assess the patient. MD will be up to assess patient shortly.

## 2018-06-23 NOTE — Progress Notes (Signed)
Patient ambulated in halls with cardiac rehab nurse. Patient is stable and continues to deny chest pain, shortness of breath, or other cardiac symptoms.

## 2018-06-23 NOTE — Progress Notes (Addendum)
Progress Note  Patient Name: [name] Date of Encounter: 06/23/2018  Primary Cardiologist: Dr. Ellyn Hack   Subjective   No acute events overnight. She is doing well and denies any chest pain since yesterday as well as shortness of breath.   Inpatient Medications    Scheduled Meds: . aspirin  324 mg Oral Once  . aspirin  81 mg Oral Daily  . carvedilol  3.125 mg Oral BID WC  . chlorthalidone  25 mg Oral Daily  . feeding supplement (ENSURE ENLIVE)  237 mL Oral BID BM  . FLUoxetine  10 mg Oral Daily  . mouth rinse  15 mL Mouth Rinse BID  . pantoprazole  40 mg Oral Daily  . rosuvastatin  20 mg Oral Daily  . sodium chloride flush  3 mL Intravenous Q12H  . ticagrelor  90 mg Oral BID   Continuous Infusions: . sodium chloride     PRN Meds: sodium chloride, acetaminophen, morphine injection, nitroGLYCERIN, ondansetron (ZOFRAN) IV, sodium chloride flush, traMADol   Vital Signs    Vitals:   06/23/18 0400 06/23/18 0416 06/23/18 0500 06/23/18 0600  BP: 137/67  (!) 130/104 136/75  Pulse: 90  93 80  Resp: 17  16 20   Temp:  98.9 F (37.2 C)    TempSrc:  Oral    SpO2: 97%  97% 99%  Weight:      Height:        Intake/Output Summary (Last 24 hours) at 06/23/2018 0644 Last data filed at 06/22/2018 1800 Gross per 24 hour  Intake 865.29 ml  Output 450 ml  Net 415.29 ml   Filed Weights   06/22/18 0223  Weight: 68 kg    Telemetry    Irregular rhythm, PVCs  - Personally Reviewed  ECG    ST elevations on inferior leads and V1 through V5 - Personally Reviewed  Physical Exam   GEN: tired-appearing elderly female in no acute distress.   Neck: No JVD Cardiac: irregular rhythm, II/VI systolic murmur best heard at upper sternal border, no rubs or gallops.  Respiratory: Clear to auscultation bilaterally. GI: Soft, nontender, non-distended  MS: No edema; No deformity. R groin soft and nontender without bruising  Neuro:  Nonfocal  Psych: Normal affect   Labs      Chemistry Recent Labs  Lab 06/22/18 0225 06/22/18 0312 06/23/18 0318  NA 136 138 136  K 3.1* 3.0* 3.0*  CL 104 102 99  CO2 22  --  24  GLUCOSE 167* 178* 148*  BUN 25* 26* 22  CREATININE 2.24* 2.30* 1.88*  CALCIUM 10.8*  --  10.9*  PROT 7.1  --   --   ALBUMIN 4.1  --   --   AST 24  --   --   ALT 13  --   --   ALKPHOS 82  --   --   BILITOT 0.6  --   --   GFRNONAA 18*  --  22*  GFRAA 21*  --  26*  ANIONGAP 10  --  13     Hematology Recent Labs  Lab 06/22/18 0225 06/22/18 0312 06/23/18 0318  WBC 8.7  --  10.4  RBC 3.54*  --  3.37*  HGB 11.3* 12.6 11.0*  HCT 36.9 37.0 33.8*  MCV 104.2*  --  100.3*  MCH 31.9  --  32.6  MCHC 30.6  --  32.5  RDW 12.4  --  12.4  PLT 150  --  147*  Cardiac Enzymes Recent Labs  Lab 06/22/18 0225 06/22/18 0637 06/22/18 1236 06/22/18 1833  TROPONINI 0.07* 0.87* 17.51* 42.62*   No results for input(s): TROPIPOC in the last 168 hours.   BNPNo results for input(s): BNP, PROBNP in the last 168 hours.   DDimer No results for input(s): DDIMER in the last 168 hours.   Radiology    Dg Chest Port 1 View  Result Date: 06/22/2018 CLINICAL DATA:  Chest pain. EXAM: PORTABLE CHEST 1 VIEW COMPARISON:  11/26/2017 FINDINGS: Lungs are adequately inflated without focal airspace consolidation or effusion. Borderline stable cardiomegaly. Remainder of the exam is unchanged. IMPRESSION: No acute cardiopulmonary disease. Electronically Signed   By: Marin Olp M.D.   On: 06/22/2018 02:43    Cardiac Studies   LHC 06/22/2018:   Mid LM to Dist LM lesion is 90% stenosed.  A drug-eluting stent was successfully placed using a STENT SYNERGY DES 3.5X12.  Post intervention, there is a 0% residual stenosis.  Dist LAD lesion is 100% stenosed.  Balloon angioplasty was performed using a BALLOON SAPPHIRE 2.5X15.  Post intervention, there is a 99% residual stenosis. The lesion behaved like a CTO.  Mid RCA lesion is 100% stenosed.  Previously  placed 1st Mrg stent (unknown type) is widely patent.  Mid RCA to Dist RCA lesion is 80% stenosed.  Known severe aortic stenosis. Did not cross the aortic valve.  Recommend uninterrupted dual antiplatelet therapy with Aspirin 81mg  daily and Ticagrelor 90mg  twice dailyfor a minimum of 12 months (ACS - Class I recommendation).  Inferior ST elevation, likely due to compromised left to right collaterals. Will have to manage ischemia and persistent inferior ST elevations medically. She is pain free at this time.   Severe aortic stenosis. Unclear if she would be a candidate for TAVR given renal insufficiency and PAD, although, it appears there was no significant right LE PAD, but there was tortuosity.  Echocardiogram 06/22/2018: Study Conclusions - Left ventricle: LVOT is narrow with turbulent outlfow. LVEF is   approximately 35 to 40% with vigorous contraction of basal   segments with near mid cavity obliteration and /akinesis of the   distal 1/3/ of the ventricle. The cavity size was mildly reduced. - Aortic valve: AV is severely thickened and calcified with   restricted motion. Peak gradient through the AV is 67 mm Hg. - Mitral valve: Calcified annulus. Mildly thickened leaflets .   There was mild regurgitation.  Impressions: - Compared to echo from September 2019, LVEF is depressed with   regional wall motion abnormalities as noted.    Patient Profile     84 y.o. female with PMH of CAD s/p PCI 1st marginal and RCA, severe aortic stenosis with mean gradient of 64, HLD, PAD s/p multiple interventions, bilateral RAS s/p PCI, CVA on ASA and Plavix, h/o DVT, and cerebral aneurysm who presented with chest pain early this AM and was found to have an inferior STEMI.   Assessment & Plan    1. Inferior STEMI HD#3: PCI/stenting of the distal left main artery Ms. Pinkley is doing well this morning and has remained chest pain free since yesterday. Her EKG this AM showed ST segment  elevation on inferior leads as well as in V1-V5 which could be evolution of her recent myocardial infarction. She is on DAPT with ASA and Brilinta. She is also on low dose Coreg that was started yesterday. Her HR remains 70-90s, will therefore titrate Coreg up today to 6.25 mg BID. She has not mobilized  since admission. Cardia rehab consult placed. Will keep her in the ICU for now and reassess later today, if continues to do well and is able to ambulate will transfer to telemetry.   2. Severe aortic stenosis: Echocardiogram showed a severely thickened and calcified aortic valve with a mean gradient of 67. She is of advance age and has multiple medical comorbidities, but is independent and completely functional at home. Consider evaluation for TAVR after she recovers from acute MI.   3. HLD: LDL 85. Continue rosuvastatin 20 mg.  4. PAD: on ASA and Brillinta.   5. History of CVA: on ASA and Brillinta.   6. 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   7. Hypokalemia: K 3.0 this AM, ordered 40 mEq Kdur   For questions or updates, please contact [place] Please consult [email or web address] for contact info under     Signed, Welford Roche, MD  06/23/2018, 6:44 AM    I have personally seen and examined this patient with Dr. Isac Sarna. I agree with the assessment and plan as outlined above. Ms. Klunk is doing well clinically this am. She has no chest pain or dyspnea. No arrhythmias overnight. Admitted with acute MI. Distal left main stent was placed but her distal LAD could not be opened. Her EKG this am shows anterior and inferior ST elevation. This is odd because her EKG had normalized post MI. I would think that this is evolution of her anterior MI with the current EKG changes. Her exam is unchanged this am. I do not think repeat cardiac cath is indicated as she has no chest pain, no dyspnea, no rhythm changes. She also has severe AS.  Will need consideration for TAVR when she recovers from her acute MI. For now, will ambulate and transfer to the telemetry unit if stable. Continue ASA/Brilinta/statin/Coreg. No Ace-inh given her chronic kidney disease.   Lauree Chandler 06/23/2018 10:05 AM

## 2018-06-24 MED ORDER — CARVEDILOL 3.125 MG PO TABS
3.1250 mg | ORAL_TABLET | Freq: Two times a day (BID) | ORAL | Status: DC
  Administered 2018-06-24 – 2018-06-27 (×6): 3.125 mg via ORAL
  Filled 2018-06-24 (×6): qty 1

## 2018-06-24 NOTE — Progress Notes (Addendum)
Reported by  Cardiac rehab that when they let her stand to walk felt dizzy, bp 99/52, lying 102/72.  Continue to monitor. NP notified with order.

## 2018-06-24 NOTE — Progress Notes (Signed)
CARDIAC REHAB PHASE I   PRE:  Rate/Rhythm: 14 SR w/ missed beats  BP:  Lying: 92/63   Sitting: 102/72        SaO2: 95 RA  MODE:  Ambulation: 0 ft only stood beside bed  POST:  Rate/Rhythm: 82 SR w/ missed beats  BP:  Lying: 99/52        SaO2:   1340 - 1410  Pt was asleep in bed upon arrival, BP was low but was willing to walk. BP elevated upon sitting. Upon standing, pt became dizzy and had to sit down and then lie down. Pt not completley responsive to questions. BP was slightly lower when lying. Swot nurse present throughout and noted the patient was less alert than the previous day. Pt left in bed with nurses present.    Philis Kendall, MS 06/24/2018 2:03 PM

## 2018-06-24 NOTE — Progress Notes (Addendum)
Progress Note  Patient Name: [name] Date of Encounter: 06/24/2018  Primary Cardiologist: Dr. Ellyn Hack   Subjective   Feeling well this morning. No chest pain or shortness of breath.   Inpatient Medications    Scheduled Meds: . aspirin  324 mg Oral Once  . aspirin  81 mg Oral Daily  . carvedilol  6.25 mg Oral BID WC  . chlorthalidone  25 mg Oral Daily  . feeding supplement (ENSURE ENLIVE)  237 mL Oral BID BM  . FLUoxetine  10 mg Oral Daily  . mouth rinse  15 mL Mouth Rinse BID  . pantoprazole  40 mg Oral Daily  . rosuvastatin  20 mg Oral Daily  . sodium chloride flush  3 mL Intravenous Q12H  . ticagrelor  90 mg Oral BID   Continuous Infusions: . sodium chloride     PRN Meds: sodium chloride, acetaminophen, morphine injection, nitroGLYCERIN, ondansetron (ZOFRAN) IV, sodium chloride flush, traMADol   Vital Signs    Vitals:   06/23/18 2300 06/24/18 0100 06/24/18 0300 06/24/18 0801  BP: (!) 105/57 (!) 90/57 (!) 90/57 (!) 99/53  Pulse: 84 (!) 54 89 82  Resp: (!) 25 (!) 27 (!) 22 (!) 22  Temp: 99.9 F (37.7 C) 98.8 F (37.1 C)  98.7 F (37.1 C)  TempSrc: Oral Oral  Oral  SpO2: 97% 96% 98% 98%  Weight:      Height:        Intake/Output Summary (Last 24 hours) at 06/24/2018 1007 Last data filed at 06/23/2018 1300 Gross per 24 hour  Intake -  Output 200 ml  Net -200 ml   Filed Weights   06/22/18 0223 06/23/18 2053  Weight: 68 kg 60.8 kg    Telemetry    Frequent PACs, few PVCs - Personally Reviewed  ECG    None performed today - Personally Reviewed  Physical Exam   GEN: elderly female in no acute distress.   Neck: No JVD Cardiac: irregular rhythm, II/VI systolic murmur, no rubs, or gallops.  Respiratory: Clear to auscultation bilaterally. GI: Soft, nontender, non-distended  MS: No edema; No deformity. Neuro:  Nonfocal  Psych: Normal affect   Labs    Chemistry Recent Labs  Lab 06/22/18 0225 06/22/18 0312 06/23/18 0318  NA 136  138 136  K 3.1* 3.0* 3.0*  CL 104 102 99  CO2 22  --  24  GLUCOSE 167* 178* 148*  BUN 25* 26* 22  CREATININE 2.24* 2.30* 1.88*  CALCIUM 10.8*  --  10.9*  PROT 7.1  --   --   ALBUMIN 4.1  --   --   AST 24  --   --   ALT 13  --   --   ALKPHOS 82  --   --   BILITOT 0.6  --   --   GFRNONAA 18*  --  22*  GFRAA 21*  --  26*  ANIONGAP 10  --  13     Hematology Recent Labs  Lab 06/22/18 0225 06/22/18 0312 06/23/18 0318  WBC 8.7  --  10.4  RBC 3.54*  --  3.37*  HGB 11.3* 12.6 11.0*  HCT 36.9 37.0 33.8*  MCV 104.2*  --  100.3*  MCH 31.9  --  32.6  MCHC 30.6  --  32.5  RDW 12.4  --  12.4  PLT 150  --  147*    Cardiac Enzymes Recent Labs  Lab 06/22/18 0225 06/22/18 8099 06/22/18 1236 06/22/18 1833  TROPONINI 0.07* 0.87* 17.51* 42.62*   No results for input(s): TROPIPOC in the last 168 hours.   BNPNo results for input(s): BNP, PROBNP in the last 168 hours.   DDimer No results for input(s): DDIMER in the last 168 hours.   Radiology    No results found.  Cardiac Studies   LHC 06/22/2018:   Mid LM to Dist LM lesion is 90% stenosed.  A drug-eluting stent was successfully placed using a STENT SYNERGY DES 3.5X12.  Post intervention, there is a 0% residual stenosis.  Dist LAD lesion is 100% stenosed.  Balloon angioplasty was performed using a BALLOON SAPPHIRE 2.5X15.  Post intervention, there is a 99% residual stenosis. The lesion behaved like a CTO.  Mid RCA lesion is 100% stenosed.  Previously placed 1st Mrg stent (unknown type) is widely patent.  Mid RCA to Dist RCA lesion is 80% stenosed.  Known severe aortic stenosis. Did not cross the aortic valve.  Recommend uninterrupted dual antiplatelet therapy with Aspirin 81mg  daily and Ticagrelor 90mg  twice dailyfor a minimum of 12 months (ACS - Class I recommendation).  Inferior ST elevation, likely due to compromised left to right collaterals. Will have to manage ischemia and persistent inferior ST  elevations medically. She is pain free at this time.   Severe aortic stenosis. Unclear if she would be a candidate for TAVR given renal insufficiency and PAD, although, it appears there was no significant right LE PAD, but there was tortuosity.  Echocardiogram 06/22/2018: Study Conclusions - Left ventricle: LVOT is narrow with turbulent outlfow. LVEF is approximately 35 to 40% with vigorous contraction of basal segments with near mid cavity obliteration and /akinesis of the distal 1/3/ of the ventricle. The cavity size was mildly reduced. - Aortic valve: AV is severely thickened and calcified with restricted motion. Peak gradient through the AV is 67 mm Hg. - Mitral valve: Calcified annulus. Mildly thickened leaflets . There was mild regurgitation.  Impressions: - Compared to echo from September 2019, LVEF is depressed with regional wall motion abnormalities as noted.   Patient Profile     84 y.o. female with PMH of CAD s/p PCI 1st marginal and RCA, severe aortic stenosis with mean gradient of 64, HLD, PAD s/p multiple interventions, bilateral RAS s/p PCI, CVA on ASA and Plavix, h/o DVT, and cerebral aneurysm who presented with chest pain early this AM and was found to have an inferior STEMI.  Assessment & Plan    1. Inferior STEMI HD#4: s/p PCI/stenting of the distal left main artery Ms. Suits continues to do well. She is chest pain free and denies shortness of breath.  She is on DAPT with ASA and Brilinta. We decreased Coreg to 3.125 mg BID due to low blood pressure overnight. Not a candidate for ACEi/ARB/ARNi due to renal insufficiency. She was seen by cardiac rehab yesterday and required assistance as well as several times to rest while walking 290 ft. We will consult PT for further evaluation regarding disposition.  2. Severe aortic stenosis:Echocardiogram showed a severely thickened and calcified aortic valve with a mean gradient of 67. She is of advance age  and has multiple medical comorbidities, but is independent and completely functional at home. Consider evaluation for TAVR after she recovers from acute MI.   3. HLD:LDL 85. Continue rosuvastatin 20 mg.  4. PAD: on ASA and Brillinta.   5. History of CVA: on ASA and Brillinta.   6. CKD, stage 3,  secondary to bilateral RAS s/p ZDG:LOVFI function stable.  For questions or updates, please contact [place] Please consult [email or web address] for contact info under     Signed, Welford Roche, MD  06/24/2018, 10:07 AM    I have personally seen and examined this patient. I agree with the assessment and plan as outlined above.  She is progressing well post MI. No chest pain or dyspnea this am. Will lower Coreg to 3.125 mg po BID given soft BP overnight. No Ace-inh/ARB due to low BP and renal insufficiency. Continue ASA/brilinta/statin. PT consult today. She also has severe AS. I would plan to let her recover from her MI for 3 months before considering TAVR. I will plan to see her in the valve clinic 4-6 weeks after discharge to discuss her AS.   Lauree Chandler 06/24/2018 10:32 AM

## 2018-06-24 NOTE — Progress Notes (Signed)
Placed by NT in a bedside commode  and reported that she slumped backward and with brief unresponsiveness, claimed she felt dizzy. bp 127/105, hr-70, r-20 pulse ox 94 % on room air.  Assisted back to bed with 3 assists. NP made aware. Continue to monitor. Bedrest emphasized and encouraged to call for help in getting out of bed. Call light within reach. Continue to monitor.

## 2018-06-24 NOTE — Clinical Social Work Note (Signed)
CSW acknowledges consult for advanced directives. Please consult Chaplain.  CSW signing off. Consult again if any social work needs arise.  Dayton Scrape, [place]

## 2018-06-24 NOTE — Progress Notes (Signed)
    Patient dropped blood pressure when attempting to stand to work with cardiac rehab this afternoon and reported feeling dizzy. Appears her coreg was decreased to 3.125mg  BID this morning. Will stop her chlorthalidone today. May need to hold her BB if continues to feel dizzy with position changes.   SignedReino Bellis, NP-C 06/24/2018, 2:24 PM Pager: [phone]

## 2018-06-25 DIAGNOSIS — I213 ST elevation (STEMI) myocardial infarction of unspecified site: Secondary | ICD-10-CM

## 2018-06-25 DIAGNOSIS — I255 Ischemic cardiomyopathy: Secondary | ICD-10-CM

## 2018-06-25 LAB — BASIC METABOLIC PANEL
Anion gap: 9 (ref 5–15)
BUN: 41 mg/dL — AB (ref 8–23)
CALCIUM: 10.6 mg/dL — AB (ref 8.9–10.3)
CO2: 25 mmol/L (ref 22–32)
CREATININE: 2.53 mg/dL — AB (ref 0.44–1.00)
Chloride: 100 mmol/L (ref 98–111)
GFR, EST AFRICAN AMERICAN: 18 mL/min — AB (ref >60)
GFR, EST NON AFRICAN AMERICAN: 15 mL/min — AB (ref >60)
Glucose, Bld: 162 mg/dL — ABNORMAL HIGH (ref 70–99)
Potassium: 3 mmol/L — ABNORMAL LOW (ref 3.5–5.1)
Sodium: 134 mmol/L — ABNORMAL LOW (ref 135–145)

## 2018-06-25 LAB — MAGNESIUM: Magnesium: 2.3 mg/dL (ref 1.7–2.4)

## 2018-06-25 MED ORDER — POTASSIUM CHLORIDE CRYS ER 20 MEQ PO TBCR
40.0000 meq | EXTENDED_RELEASE_TABLET | Freq: Two times a day (BID) | ORAL | Status: AC
  Administered 2018-06-25 (×2): 40 meq via ORAL
  Filled 2018-06-25 (×2): qty 2

## 2018-06-25 MED ORDER — SODIUM CHLORIDE 0.9 % IV SOLN
INTRAVENOUS | Status: AC
  Administered 2018-06-25: 12:00:00 via INTRAVENOUS

## 2018-06-25 NOTE — Progress Notes (Signed)
Pt sts she just got to bed after sitting in chair an hour and five minutes after walking earlier with PT. Sts she has been feeling dizzy and just wants to rest. Her fluids were just started and BP is still in the 90s lying. Will let her rest now.  [place], ACSM 1:14 PM 06/25/2018

## 2018-06-25 NOTE — Progress Notes (Addendum)
Progress Note  Patient Name: [name] Date of Encounter: 06/25/2018  Primary Cardiologist: Dr. Ellyn Hack   Subjective   Mrs Shelden is feeling well this morning. Denies chest pain and SOB. She did have an episode of dizziness yesterday, but states she has been feeling well since. She states she feels better after drinking her nutrition shake.  Inpatient Medications    Scheduled Meds: . aspirin  81 mg Oral Daily  . carvedilol  3.125 mg Oral BID WC  . feeding supplement (ENSURE ENLIVE)  237 mL Oral BID BM  . FLUoxetine  10 mg Oral Daily  . mouth rinse  15 mL Mouth Rinse BID  . pantoprazole  40 mg Oral Daily  . rosuvastatin  20 mg Oral Daily  . sodium chloride flush  3 mL Intravenous Q12H  . ticagrelor  90 mg Oral BID   Continuous Infusions: . sodium chloride     PRN Meds: sodium chloride, acetaminophen, morphine injection, nitroGLYCERIN, ondansetron (ZOFRAN) IV, sodium chloride flush, traMADol   Vital Signs    Vitals:   06/24/18 1951 06/24/18 2043 06/24/18 2354 06/25/18 0342  BP: 108/70  99/73 (!) 94/53  Pulse: 98  100 92  Resp: (!) 32 (!) 24 (!) 24 17  Temp: 97.8 F (36.6 C)  98.4 F (36.9 C) 98.6 F (37 C)  TempSrc: Oral  Oral Oral  SpO2: 98%  98% 100%  Weight:      Height:        Intake/Output Summary (Last 24 hours) at 06/25/2018 0754 Last data filed at 06/25/2018 0600 Gross per 24 hour  Intake 300 ml  Output 250 ml  Net 50 ml   Filed Weights   06/22/18 0223 06/23/18 2053  Weight: 68 kg 60.8 kg    Telemetry    NSR, PVCs, short pauses <3 sec - Personally Reviewed  ECG    None Today  Physical Exam   GEN: elderly female in no acute distress.   Neck: No JVD Cardiac: RRR, II/VI systolic murmur; no rubs, or gallops.  Respiratory: Clear to auscultation bilaterally. GI: Soft, nontender, non-distended  MS: No edema; No deformity. Neuro:  Nonfocal  Psych: Normal affect   Labs    Chemistry Recent Labs  Lab 06/22/18 0225  06/22/18 0312 06/23/18 0318  NA 136 138 136  K 3.1* 3.0* 3.0*  CL 104 102 99  CO2 22  --  24  GLUCOSE 167* 178* 148*  BUN 25* 26* 22  CREATININE 2.24* 2.30* 1.88*  CALCIUM 10.8*  --  10.9*  PROT 7.1  --   --   ALBUMIN 4.1  --   --   AST 24  --   --   ALT 13  --   --   ALKPHOS 82  --   --   BILITOT 0.6  --   --   GFRNONAA 18*  --  22*  GFRAA 21*  --  26*  ANIONGAP 10  --  13     Hematology Recent Labs  Lab 06/22/18 0225 06/22/18 0312 06/23/18 0318  WBC 8.7  --  10.4  RBC 3.54*  --  3.37*  HGB 11.3* 12.6 11.0*  HCT 36.9 37.0 33.8*  MCV 104.2*  --  100.3*  MCH 31.9  --  32.6  MCHC 30.6  --  32.5  RDW 12.4  --  12.4  PLT 150  --  147*    Cardiac Enzymes Recent Labs  Lab 06/22/18 0225 06/22/18 0637 06/22/18  1236 06/22/18 1833  TROPONINI 0.07* 0.87* 17.51* 42.62*   No results for input(s): TROPIPOC in the last 168 hours.   BNPNo results for input(s): BNP, PROBNP in the last 168 hours.   DDimer No results for input(s): DDIMER in the last 168 hours.   Radiology    No results found.  Cardiac Studies   LHC 06/22/2018:   Mid LM to Dist LM lesion is 90% stenosed.  A drug-eluting stent was successfully placed using a STENT SYNERGY DES 3.5X12.  Post intervention, there is a 0% residual stenosis.  Dist LAD lesion is 100% stenosed.  Balloon angioplasty was performed using a BALLOON SAPPHIRE 2.5X15.  Post intervention, there is a 99% residual stenosis. The lesion behaved like a CTO.  Mid RCA lesion is 100% stenosed.  Previously placed 1st Mrg stent (unknown type) is widely patent.  Mid RCA to Dist RCA lesion is 80% stenosed.  Known severe aortic stenosis. Did not cross the aortic valve.  Recommend uninterrupted dual antiplatelet therapy with Aspirin 81mg  daily and Ticagrelor 90mg  twice dailyfor a minimum of 12 months (ACS - Class I recommendation).  Inferior ST elevation, likely due to compromised left to right collaterals. Will have to manage  ischemia and persistent inferior ST elevations medically. She is pain free at this time.   Severe aortic stenosis. Unclear if she would be a candidate for TAVR given renal insufficiency and PAD, although, it appears there was no significant right LE PAD, but there was tortuosity.  Echocardiogram 06/22/2018: Study Conclusions - Left ventricle: LVOT is narrow with turbulent outlfow. LVEF is approximately 35 to 40% with vigorous contraction of basal segments with near mid cavity obliteration and /akinesis of the distal 1/3/ of the ventricle. The cavity size was mildly reduced. - Aortic valve: AV is severely thickened and calcified with restricted motion. Peak gradient through the AV is 67 mm Hg. - Mitral valve: Calcified annulus. Mildly thickened leaflets . There was mild regurgitation.  Impressions: - Compared to echo from September 2019, LVEF is depressed with regional wall motion abnormalities as noted.   Patient Profile     84 y.o. female with PMH of CAD s/p PCI 1st marginal and RCA, severe aortic stenosis with mean gradient of 64, HLD, PAD s/p multiple interventions, bilateral RAS s/p PCI, CVA on ASA and Plavix, h/o DVT, and cerebral aneurysm who presented with chest pain early this AM and was found to have an inferior STEMI.  Assessment & Plan    1. STEMI: HD#5: s/p PCI/stenting of the distal left main artery Ms. Line continues to do well. She remains chest pain free. Echo with EF 35-40%. Not a candidate for ACEi/ARB/ARNi due to renal insufficiency. - DAPT (ASA/Brilinta) - Coreg 3.125mg  BID (unable to tolerate up-titration) - PT evaluation  2. Severe aortic stenosis:Echocardiogram with severely thickened and calcified aortic valve; mean gradient of 67. She is of advance age and has multiple medical comorbidities, but is independent and completely functional at home. Consider evaluation for TAVR after she recovers from acute MI.   3. Hypertension: Has had  soft BP here. Had episodes of dizziness 10/30 and Chlorthalidone was discontinued. Will continue to monitor. - On Coreg as above  4. Hypokalemia: K remains low at 3.0 this AM. Mg Okay. - KCl 43mEq  PO BID x2 - AM BMP  5. CKD, stage 3,  secondary to bilateral RAS s/p PCI:Cr elevated today, will encourage hydration and give gentle IV Hydration considering concurrent hypotension.  6. HLD:LDL 85. Continue rosuvastatin  20 mg. 7. PAD: on ASA and Brillinta.  8. History of CVA: on ASA and Brillinta.    For questions or updates, please contact [place] Please consult [email or web address] for contact info under     Signed, Neva Seat, MD  06/25/2018, 7:54 AM    I have personally seen and examined this patient. I agree with the assessment and plan as outlined above.  No chest pain or dyspnea. BP is still soft. Renal function has worsened this am. Will hydrate gently. No ARB or Ace-inh. Will work with PT today. BMET tomorrow.   Lauree Chandler 06/25/2018 10:40 AM

## 2018-06-25 NOTE — Evaluation (Signed)
Physical Therapy Evaluation Patient Details Name: [name] MRN: [id number] DOB: 1924-04-06 Today's Date: 06/25/2018   History of Present Illness  Pt adm with chest pain and found to have STEMI. Pt s/p PCI. PMH - HTN, CAD, ckd, aortic stenosis, pvd  Clinical Impression  Pt presents to PT with decreased mobility due to weakness and likely orthostatic hypotension. Attempted to get orthostatic BP's prior to amb but unable to get accurate reading in standing. Pt initially amb well with rollator but toward end of ambulation she leaned over and propped herself on handles of rollator and said she was tired. Pt declined offer to sit. Pt made it back into room and sat down exhausted. Placed pt in reclined position and took BP (83/50). After 2 minutes took BP again (106/72) and pt reports feeling better. Questioned pt if when she stopped to rest was she tired or was she light headed. Pt responded that she may have been lightheaded. I suspect she is getting orthostatic while amb given the significant change in her gait and her fatigue in a short time frame and then a quick rebound to feeling better.   Pt wants to return to her home and is adamant that she would not consider SNF. She has been caring for grandchildren and states that she can no longer do this. I think she can return home with home health if her BP is okay, she modifies her activity level, and she has some help with household activities (cooking, cleaning).   Orthostatic BPs  Supine 98/59  Sitting 100/63  Standing Inaccurate reading 194/142  Sitting after amb 83/50       Follow Up Recommendations Home health PT;Supervision - Intermittent    Equipment Recommendations  None recommended by PT    Recommendations for Other Services       Precautions / Restrictions Precautions Precautions: Fall;Other (comment) Precaution Comments: watch orthostatic Restrictions Weight Bearing Restrictions: No      Mobility  Bed  Mobility Overal bed mobility: Modified Independent                Transfers Overall transfer level: Needs assistance Equipment used: 4-wheeled walker Transfers: Sit to/from Stand Sit to Stand: Supervision         General transfer comment: supervision for safety and lines  Ambulation/Gait Ambulation/Gait assistance: Supervision;Min guard Gait Distance (Feet): 225 Feet Assistive device: 4-wheeled walker Gait Pattern/deviations: Step-through pattern;Decreased stride length Gait velocity: decr Gait velocity interpretation: 1.31 - 2.62 ft/sec, indicative of limited community ambulator General Gait Details: Pt initially moving well with rollator and only requiring supervision for safety and lines. After about 200' pt reported she was tired and propped forearms on walker to rest. Declined offer to sit and rest. Amb last 25' to room with min guard.  Stairs            Wheelchair Mobility    Modified Rankin (Stroke Patients Only)       Balance Overall balance assessment: Needs assistance Sitting-balance support: No upper extremity supported;Feet supported Sitting balance-Leahy Scale: Good     Standing balance support: No upper extremity supported;During functional activity Standing balance-Leahy Scale: Fair                               Pertinent Vitals/Pain Pain Assessment: No/denies pain    Home Living Family/patient expects to be discharged to:: Private residence Living Arrangements: Children Available Help at Discharge: Family;Available PRN/intermittently Type of Home:  House Home Access: Level entry     Home Layout: One level Home Equipment: [place] - 2 wheels;Cane - single point;Bedside commode      Prior Function Level of Independence: Independent         Comments: Pt drives, takes care of grandchildren     Hand Dominance   Dominant Hand: Right    Extremity/Trunk Assessment   Upper Extremity Assessment Upper Extremity  Assessment: Defer to OT evaluation    Lower Extremity Assessment Lower Extremity Assessment: Generalized weakness       Communication   Communication: No difficulties  Cognition Arousal/Alertness: Awake/alert Behavior During Therapy: WFL for tasks assessed/performed Overall Cognitive Status: Within Functional Limits for tasks assessed                                        General Comments      Exercises     Assessment/Plan    PT Assessment Patient needs continued PT services  PT Problem List Decreased strength;Decreased activity tolerance;Decreased balance;Decreased mobility;Cardiopulmonary status limiting activity       PT Treatment Interventions DME instruction;Gait training;Functional mobility training;Therapeutic activities;Therapeutic exercise;Balance training;Patient/family education    PT Goals (Current goals can be found in the Care Plan section)  Acute Rehab PT Goals Patient Stated Goal: return home PT Goal Formulation: With patient Time For Goal Achievement: 07/09/18 Potential to Achieve Goals: Good    Frequency Min 3X/week   Barriers to discharge Decreased caregiver support may not have 24 hour assist    Co-evaluation               AM-PAC PT "6 Clicks" Daily Activity  Outcome Measure Difficulty turning over in bed (including adjusting bedclothes, sheets and blankets)?: A Little Difficulty moving from lying on back to sitting on the side of the bed? : A Little Difficulty sitting down on and standing up from a chair with arms (e.g., wheelchair, bedside commode, etc,.)?: A Little Help needed moving to and from a bed to chair (including a wheelchair)?: A Little Help needed walking in hospital room?: A Little Help needed climbing 3-5 steps with a railing? : A Little 6 Click Score: 18    End of Session Equipment Utilized During Treatment: Gait belt Activity Tolerance: Patient limited by fatigue Patient left: in chair;with call  bell/phone within reach;with chair alarm set Nurse Communication: Mobility status;Other (comment)(likely orthostatic) PT Visit Diagnosis: Unsteadiness on feet (R26.81);Other abnormalities of gait and mobility (R26.89);Muscle weakness (generalized) (M62.81)    Time: 4196-2229 PT Time Calculation (min) (ACUTE ONLY): 25 min   Charges:   PT Evaluation $PT Eval Moderate Complexity: 1 Mod PT Treatments $Gait Training: 8-22 mins        [place] Pager [phone] Office [place] 06/25/2018, 10:57 AM

## 2018-06-26 LAB — BASIC METABOLIC PANEL
ANION GAP: 13 (ref 5–15)
BUN: 45 mg/dL — ABNORMAL HIGH (ref 8–23)
CHLORIDE: 97 mmol/L — AB (ref 98–111)
CO2: 24 mmol/L (ref 22–32)
Calcium: 10.4 mg/dL — ABNORMAL HIGH (ref 8.9–10.3)
Creatinine, Ser: 2.58 mg/dL — ABNORMAL HIGH (ref 0.44–1.00)
GFR calc non Af Amer: 15 mL/min — ABNORMAL LOW (ref >60)
GFR, EST AFRICAN AMERICAN: 17 mL/min — AB (ref >60)
GLUCOSE: 150 mg/dL — AB (ref 70–99)
Potassium: 3.4 mmol/L — ABNORMAL LOW (ref 3.5–5.1)
Sodium: 134 mmol/L — ABNORMAL LOW (ref 135–145)

## 2018-06-26 MED ORDER — SODIUM CHLORIDE 0.9 % IV SOLN
INTRAVENOUS | Status: AC
  Administered 2018-06-26: 11:00:00 via INTRAVENOUS

## 2018-06-26 MED ORDER — ADULT MULTIVITAMIN W/MINERALS CH
1.0000 | ORAL_TABLET | Freq: Every day | ORAL | Status: DC
  Administered 2018-06-26 – 2018-06-27 (×2): 1 via ORAL
  Filled 2018-06-26 (×2): qty 1

## 2018-06-26 NOTE — Evaluation (Signed)
Occupational Therapy Evaluation Patient Details Name: [name] MRN: [id number] DOB: 1923/10/04 Today's Date: 06/26/2018    History of Present Illness Pt adm with chest pain and found to have STEMI. Pt s/p PCI. PMH - HTN, CAD, ckd, aortic stenosis, pvd   Clinical Impression   Pt was independent and active prior to admission. She is currently functioning at a supervision level due to soft BP, but did not report any dizziness or being light headed this visit. Will follow acutely.    Follow Up Recommendations  No OT Follow Up    Equipment Recommendations  None recommended by OT    Recommendations for Other Services       Precautions / Restrictions Precautions Precautions: Fall;Other (comment) Precaution Comments: hypotensive Restrictions Weight Bearing Restrictions: No      Mobility Bed Mobility Overal bed mobility: Modified Independent                Transfers Overall transfer level: Needs assistance Equipment used: Rolling walker (2 wheeled) Transfers: Sit to/from Stand Sit to Stand: Supervision         General transfer comment: supervision for safety and lines    Balance Overall balance assessment: Needs assistance   Sitting balance-Leahy Scale: Good     Standing balance support: No upper extremity supported;During functional activity Standing balance-Leahy Scale: Fair                             ADL either performed or assessed with clinical judgement   ADL Overall ADL's : Needs assistance/impaired Eating/Feeding: Independent;Sitting   Grooming: Wash/dry hands;Standing;Supervision/safety   Upper Body Bathing: Set up;Sitting   Lower Body Bathing: Supervison/ safety;Sit to/from stand   Upper Body Dressing : Set up;Sitting   Lower Body Dressing: Supervision/safety;Sit to/from stand   Toilet Transfer: Supervision/safety;Stand-pivot;BSC   Toileting- Water quality scientist and Hygiene: Supervision/safety;Sit to/from stand        Functional mobility during ADLs: Min guard;Rolling walker       Vision Baseline Vision/History: No visual deficits Patient Visual Report: No change from baseline       Perception     Praxis      Pertinent Vitals/Pain Pain Assessment: Faces Faces Pain Scale: Hurts a little bit Pain Location: toe on R foot Pain Descriptors / Indicators: Sore Pain Intervention(s): Monitored during session     Hand Dominance Right   Extremity/Trunk Assessment Upper Extremity Assessment Upper Extremity Assessment: Overall WFL for tasks assessed   Lower Extremity Assessment Lower Extremity Assessment: Defer to PT evaluation       Communication Communication Communication: No difficulties   Cognition Arousal/Alertness: Awake/alert Behavior During Therapy: WFL for tasks assessed/performed Overall Cognitive Status: Within Functional Limits for tasks assessed                                     General Comments       Exercises     Shoulder Instructions      Home Living Family/patient expects to be discharged to:: Private residence Living Arrangements: Children(granddaughter) Available Help at Discharge: Family;Available PRN/intermittently Type of Home: House Home Access: Level entry     Home Layout: One level     Bathroom Shower/Tub: Teacher, early years/pre: Standard     Home Equipment: Environmental consultant - 2 wheels;Cane - single point;Bedside commode;Shower seat;Grab bars - tub/shower;Hand held shower head  Prior Functioning/Environment Level of Independence: Independent with assistive device(s)        Comments: walks without a device, drives, babysits grandchildren        OT Problem List: Impaired balance (sitting and/or standing)      OT Treatment/Interventions: Self-care/ADL training;DME and/or AE instruction;Patient/family education;Balance training;Therapeutic activities    OT Goals(Current goals can be found in the care plan  section) Acute Rehab OT Goals Patient Stated Goal: return home OT Goal Formulation: With patient Time For Goal Achievement: 07/10/18 Potential to Achieve Goals: Good ADL Goals Pt Will Perform Grooming: with modified independence;standing Pt Will Transfer to Toilet: with modified independence;ambulating;bedside commode Pt Will Perform Toileting - Clothing Manipulation and hygiene: with modified independence;sit to/from stand Pt Will Perform Tub/Shower Transfer: with modified independence;ambulating;shower seat;grab bars Additional ADL Goal #1: Pt will gather items necessary for ADL around her room modified independently.  OT Frequency: Min 2X/week   Barriers to D/C:            Co-evaluation              AM-PAC PT "6 Clicks" Daily Activity     Outcome Measure Help from another person eating meals?: None Help from another person taking care of personal grooming?: A Little Help from another person toileting, which includes using toliet, bedpan, or urinal?: A Little Help from another person bathing (including washing, rinsing, drying)?: A Little Help from another person to put on and taking off regular upper body clothing?: None Help from another person to put on and taking off regular lower body clothing?: A Little 6 Click Score: 20   End of Session Equipment Utilized During Treatment: Gait belt;Rolling walker  Activity Tolerance: Patient tolerated treatment well Patient left: in bed;with call bell/phone within reach  OT Visit Diagnosis: Unsteadiness on feet (R26.81);Other abnormalities of gait and mobility (R26.89)                Time: 8527-7824 OT Time Calculation (min): 27 min Charges:  OT General Charges $OT Visit: 1 Visit OT Evaluation $OT Eval Moderate Complexity: 1 Mod OT Treatments $Self Care/Home Management : 8-22 mins  Nestor Lewandowsky, OTR/L Acute Rehabilitation Services Pager: [phone] Office: [phone]  [name] 06/26/2018, 1:40 PM

## 2018-06-26 NOTE — Progress Notes (Signed)
Physical Therapy Treatment Patient Details Name: [name] MRN: [id number] DOB: [birth year] Today's Date: 06/26/2018    History of Present Illness Pt adm with chest pain and found to have STEMI. Pt s/p PCI. PMH - HTN, CAD, ckd, aortic stenosis, pvd    PT Comments    Patient is progressing very well towards their physical therapy goals. Session focused on continued progression of activity tolerance and functional mobility. Patient ambulating 250 feet with walker and supervision. Prior to mobility, BP 103/69, HR 90 and post mobility BP 108/65, HR 107. Denies lightheadedness or dizziness.    Follow Up Recommendations  Home health PT;Supervision - Intermittent     Equipment Recommendations  None recommended by PT    Recommendations for Other Services       Precautions / Restrictions Precautions Precautions: Fall;Other (comment) Precaution Comments: hypotensive Restrictions Weight Bearing Restrictions: No    Mobility  Bed Mobility Overal bed mobility: Modified Independent                Transfers Overall transfer level: Needs assistance Equipment used: Rolling walker (2 wheeled) Transfers: Sit to/from Stand Sit to Stand: Supervision         General transfer comment: supervision for safety and lines from edge of bed and toilet  Ambulation/Gait Ambulation/Gait assistance: Supervision   Assistive device: Rolling walker (2 wheeled) Gait Pattern/deviations: Step-through pattern;Decreased stride length Gait velocity: decr   General Gait Details: Pt demonstrating improved posture and gait speed.    Stairs             Wheelchair Mobility    Modified Rankin (Stroke Patients Only)       Balance Overall balance assessment: Needs assistance   Sitting balance-Leahy Scale: Good     Standing balance support: No upper extremity supported;During functional activity Standing balance-Leahy Scale: Fair                               Cognition Arousal/Alertness: Awake/alert Behavior During Therapy: WFL for tasks assessed/performed Overall Cognitive Status: Within Functional Limits for tasks assessed                                        Exercises      General Comments        Pertinent Vitals/Pain Pain Assessment: Faces Faces Pain Scale: No hurt    Home Living                      Prior Function            PT Goals (current goals can now be found in the care plan section) Acute Rehab PT Goals Patient Stated Goal: return home Potential to Achieve Goals: Good Progress towards PT goals: Progressing toward goals    Frequency    Min 3X/week      PT Plan Current plan remains appropriate    Co-evaluation              AM-PAC PT "6 Clicks" Daily Activity  Outcome Measure  Difficulty turning over in bed (including adjusting bedclothes, sheets and blankets)?: A Little Difficulty moving from lying on back to sitting on the side of the bed? : A Little Difficulty sitting down on and standing up from a chair with arms (e.g., wheelchair, bedside commode, etc,.)?: A Little Help needed moving to  and from a bed to chair (including a wheelchair)?: A Little Help needed walking in hospital room?: A Little Help needed climbing 3-5 steps with a railing? : A Little 6 Click Score: 18    End of Session Equipment Utilized During Treatment: Gait belt Activity Tolerance: Patient tolerated treatment well Patient left: in bed;with call bell/phone within reach   PT Visit Diagnosis: Unsteadiness on feet (R26.81);Other abnormalities of gait and mobility (R26.89);Muscle weakness (generalized) (M62.81)     Time: 8333-8329 PT Time Calculation (min) (ACUTE ONLY): 35 min  Charges:  $Therapeutic Activity: 23-37 mins          [name], [place], DPT Acute Rehabilitation Services Pager [phone] Office [phone]    [name] 06/26/2018, 4:21 PM

## 2018-06-26 NOTE — Care Management Note (Signed)
Case Management Note Previous Note Created by Midge Minium  Patient Details  Name: [name] MRN: [id number] Date of Birth: 1924/02/12  Subjective/Objective:   84yo female presented with CP; s/p STEMI with PCI.            Action/Plan: CM met with patient and her granddaughter to discuss transitional needs. Patient lives at home with her granddaughter, independent with ADLs, still drives, with no DME in use. PCP confirmed as: Dr.John Careers adviser; pharmacy of choice: [facility], [place]. [employer] benefits check completed with est monthly cost $45; CM informed patient and provided the [employer] free 30-day supply card. CM discussed [place] service, with patient requesting to use for her DC Rx. Patient indicated her family would provide transportation home. CM will continue to follow.   Expected Discharge Date:                  Expected Discharge Plan:  Home/Self Care  In-House Referral:  NA  Discharge planning Services  CM Consult, Medication Assistance([employer] benefits check with card provided)  Post Acute Care Choice:  NA Choice offered to:  NA  DME Arranged:  N/A DME Agency:  NA  HH Arranged:  NA HH Agency:  NA  Status of Service:  In process, will continue to follow  If discussed at Long Length of Stay Meetings, dates discussed:    Additional Comments:  06/26/2018, 4:31 PM  CM provided [facility] list to pt and will follow up with pt to get choice.

## 2018-06-26 NOTE — Progress Notes (Signed)
Nutrition Follow-up  DOCUMENTATION CODES:   Not applicable  INTERVENTION:   -Continue Ensure Enlive po BID, each supplement provides 350 kcal and 20 grams of protein -MVI with minerals daily  NUTRITION DIAGNOSIS:   Increased nutrient needs related to acute illness as evidenced by estimated needs.  Ongoing  GOAL:   Patient will meet greater than or equal to 90% of their needs  Progressing  MONITOR:   PO intake, Labs, Skin, Weight trends, I & O's  REASON FOR ASSESSMENT:   Malnutrition Screening Tool    ASSESSMENT:   84 yo Female w/ h/o CAD s/p multiple PCI, severe AS, diffuse PAD s/p multiple interventions, h/o DVT, dyslipidemia, h/o CVA, h/o cerebral aneurysm, bilateral RAS s/p PCI who follows w/ Dr. Glenetta Hew now presents via EMS as inferior STEMI.  10/28- s/p cardiac cath  Per cardiology notes, pt will need consideration for TAVR after recovering from acute MI.   Pt in good spirits, working with occupational therapy at time of visit.   Case discussed with RN, who reports appetite has improved, however, pt usually consumes about 50% of meals. Documented meal completion 25-60%. Pt loves Ensure supplements and has been consuming them- pt actually requested one earlier this AM.   Labs reviewed: Na: 134, K: 3.4 CBGS: 155.   Diet Order:   Diet Order            Diet Heart Room service appropriate? Yes; Fluid consistency: Thin  Diet effective now              EDUCATION NEEDS:   Not appropriate for education at this time  Skin:  Skin Assessment: Skin Integrity Issues: Skin Integrity Issues:: Incisions Incisions: open foot  Last BM:  06/24/18  Height:   Ht Readings from Last 1 Encounters:  06/22/18 5\' 5"  (1.651 m)    Weight:   Wt Readings from Last 1 Encounters:  06/23/18 60.8 kg    Ideal Body Weight:  56.8 kg  BMI:  Body mass index is 22.31 kg/m.  Estimated Nutritional Needs:   Kcal:  1500-1700  Protein:  85-100 gm  Fluid:  per  MD    Brighid Koch A. Jimmye Norman, RD, LDN, CDE Pager: [phone] After hours Pager: [phone]

## 2018-06-26 NOTE — Progress Notes (Signed)
CARDIAC REHAB PHASE I   PRE:  Rate/Rhythm: 82 SR w/ PVCs  BP:  Lying: 114/63   Sitting: 92/77   Standing: 96/55  Pt asymptomatic w/ orthos      SaO2: 94 RA  MODE:  Ambulation: 250 ft   POST:  Rate/Rhythm: 84 SR w/ PVCs  BP:  Sitting: 92/45        SaO2: 100 RA  1120 - 1155  Pt ambulated 250 with assistance x2 and gait belt and walker. Gait was slow and steady. Took 1 short standing rest break. Pt c/o fatigue upon entering room. Pt in bed with call bell in reach.   Philis Kendall, MS 06/26/2018 11:50 AM

## 2018-06-26 NOTE — Progress Notes (Signed)
Progress Note  Patient Name: [name] Date of Encounter: 06/26/2018  Primary Cardiologist: Dr. Ellyn Hack   Subjective   No chest pain. She says she is less dizzy today.   Inpatient Medications    Scheduled Meds: . aspirin  81 mg Oral Daily  . carvedilol  3.125 mg Oral BID WC  . feeding supplement (ENSURE ENLIVE)  237 mL Oral BID BM  . FLUoxetine  10 mg Oral Daily  . mouth rinse  15 mL Mouth Rinse BID  . pantoprazole  40 mg Oral Daily  . rosuvastatin  20 mg Oral Daily  . sodium chloride flush  3 mL Intravenous Q12H  . ticagrelor  90 mg Oral BID   Continuous Infusions: . sodium chloride     PRN Meds: sodium chloride, acetaminophen, morphine injection, nitroGLYCERIN, ondansetron (ZOFRAN) IV, sodium chloride flush, traMADol   Vital Signs    Vitals:   06/25/18 2003 06/25/18 2322 06/26/18 0329 06/26/18 0740  BP: 113/73 105/72 114/82 113/63  Pulse: 96   78  Resp: 20   (!) 23  Temp:  97.8 F (36.6 C) 98 F (36.7 C) 98.8 F (37.1 C)  TempSrc:  Oral Oral Oral  SpO2: 97%   98%  Weight:      Height:        Intake/Output Summary (Last 24 hours) at 06/26/2018 1002 Last data filed at 06/25/2018 2226 Gross per 24 hour  Intake 528 ml  Output 500 ml  Net 28 ml   Filed Weights   06/22/18 0223 06/23/18 2053  Weight: 68 kg 60.8 kg    Telemetry    sinus- Personally Reviewed  ECG    N/A  Physical Exam   General: Well developed, well nourished, NAD  HEENT: OP clear, mucus membranes moist  SKIN: warm, dry. No rashes. Neuro: No focal deficits  Musculoskeletal: Muscle strength 5/5 all ext  Psychiatric: Mood and affect normal  Neck: No JVD, no carotid bruits, no thyromegaly, no lymphadenopathy.  Lungs:Clear bilaterally, no wheezes, rhonci, crackles Cardiovascular: Regular rate and rhythm. Loud harsh systolic murmur.  Abdomen:Soft. Bowel sounds present. Non-tender.  Extremities: No lower extremity edema. Distal pulses are faint. Right 4th toe tip is black.     Labs    Chemistry Recent Labs  Lab 06/22/18 0225  06/23/18 0318 06/25/18 0801 06/26/18 0233  NA 136   < > 136 134* 134*  K 3.1*   < > 3.0* 3.0* 3.4*  CL 104   < > 99 100 97*  CO2 22  --  24 25 24   GLUCOSE 167*   < > 148* 162* 150*  BUN 25*   < > 22 41* 45*  CREATININE 2.24*   < > 1.88* 2.53* 2.58*  CALCIUM 10.8*  --  10.9* 10.6* 10.4*  PROT 7.1  --   --   --   --   ALBUMIN 4.1  --   --   --   --   AST 24  --   --   --   --   ALT 13  --   --   --   --   ALKPHOS 82  --   --   --   --   BILITOT 0.6  --   --   --   --   GFRNONAA 18*  --  22* 15* 15*  GFRAA 21*  --  26* 18* 17*  ANIONGAP 10  --  13 9 13    < > =  values in this interval not displayed.     Hematology Recent Labs  Lab 06/22/18 0225 06/22/18 0312 06/23/18 0318  WBC 8.7  --  10.4  RBC 3.54*  --  3.37*  HGB 11.3* 12.6 11.0*  HCT 36.9 37.0 33.8*  MCV 104.2*  --  100.3*  MCH 31.9  --  32.6  MCHC 30.6  --  32.5  RDW 12.4  --  12.4  PLT 150  --  147*    Cardiac Enzymes Recent Labs  Lab 06/22/18 0225 06/22/18 0637 06/22/18 1236 06/22/18 1833  TROPONINI 0.07* 0.87* 17.51* 42.62*   No results for input(s): TROPIPOC in the last 168 hours.   BNPNo results for input(s): BNP, PROBNP in the last 168 hours.   DDimer No results for input(s): DDIMER in the last 168 hours.   Radiology    No results found.  Cardiac Studies   LHC 06/22/2018:   Mid LM to Dist LM lesion is 90% stenosed.  A drug-eluting stent was successfully placed using a STENT SYNERGY DES 3.5X12.  Post intervention, there is a 0% residual stenosis.  Dist LAD lesion is 100% stenosed.  Balloon angioplasty was performed using a BALLOON SAPPHIRE 2.5X15.  Post intervention, there is a 99% residual stenosis. The lesion behaved like a CTO.  Mid RCA lesion is 100% stenosed.  Previously placed 1st Mrg stent (unknown type) is widely patent.  Mid RCA to Dist RCA lesion is 80% stenosed.  Known severe aortic stenosis. Did not cross  the aortic valve.  Recommend uninterrupted dual antiplatelet therapy with Aspirin 81mg  daily and Ticagrelor 90mg  twice dailyfor a minimum of 12 months (ACS - Class I recommendation).  Inferior ST elevation, likely due to compromised left to right collaterals. Will have to manage ischemia and persistent inferior ST elevations medically. She is pain free at this time.   Severe aortic stenosis. Unclear if she would be a candidate for TAVR given renal insufficiency and PAD, although, it appears there was no significant right LE PAD, but there was tortuosity.  Echocardiogram 06/22/2018: Study Conclusions - Left ventricle: LVOT is narrow with turbulent outlfow. LVEF is approximately 35 to 40% with vigorous contraction of basal segments with near mid cavity obliteration and /akinesis of the distal 1/3/ of the ventricle. The cavity size was mildly reduced. - Aortic valve: AV is severely thickened and calcified with restricted motion. Peak gradient through the AV is 67 mm Hg. - Mitral valve: Calcified annulus. Mildly thickened leaflets . There was mild regurgitation.  Impressions: - Compared to echo from September 2019, LVEF is depressed with regional wall motion abnormalities as noted.   Patient Profile     84 y.o. female with PMH of CAD s/p PCI 1st marginal and RCA, severe aortic stenosis with mean gradient of 64, HLD, PAD s/p multiple interventions, bilateral RAS s/p PCI, CVA on ASA and Plavix, h/o DVT, and cerebral aneurysm who was admitted with an inferior STEMI on 06/22/18. Severe disease in the distal left main artery with collaterals to the RCA supplied by the left coronary system. A distal left main stent was placed. The distal LAD could not be opened. LVEF 35-40% by echo post MI. Severe AS again noted on echo.   Assessment & Plan    1. CAD/Inferior STEMI: She is s/p stenting of the distal left main artery. Collaterals to the RCA are through the left system. The  distal LAD could not be opened with POBA. She has been on low dose beta blocker and  DAPT with ASA and Brilinta. She has not been able to tolerate uptitration of the beta blocker due to soft BP and dizziness. No chest pain this am. Working with cardiac rehab and PT. She is progressing slowly post MI and will need another 24-48 hours hospitalization.   2. Severe aortic stenosis:Echocardiogram with severely thickened and calcified aortic valve; mean gradient of 67. She could be considered for TAVR in several months if she recovers well from her MI. Options may be limited given her PAD.   3. Hypertension: BP stable on low dose Coreg.   4. Hypokalemia: Replace potassium.   5. CKD, stage 3,  secondary to bilateral RAS s/p HTD:SKAJGO today. Continue gentle hydration today.   6. TLX:BWIOMBTD statin.   7. PAD: She is known to have severe PAD. She has a chronic distal left SFA occlusion with left posterior and tibial artery occlusions, right posterior tibial artery occlusion. Her right SFA has been stented. ABI 06/05/18 severely reduced. She has been followed in the past by DR. Gwenlyn Found. Her right 4th toe necrosis is not a new issue. Dr. Gwenlyn Found can review on Monday and make plans for any further testing that may be needed.      For questions or updates, please contact [place] Please consult [email or web address] for contact info under     Signed, Lauree Chandler, MD  06/26/2018, 10:02 AM

## 2018-06-27 ENCOUNTER — Encounter (HOSPITAL_COMMUNITY): Payer: Self-pay | Admitting: Physician Assistant

## 2018-06-27 DIAGNOSIS — I671 Cerebral aneurysm, nonruptured: Secondary | ICD-10-CM | POA: Diagnosis present

## 2018-06-27 DIAGNOSIS — I6529 Occlusion and stenosis of unspecified carotid artery: Secondary | ICD-10-CM | POA: Diagnosis present

## 2018-06-27 LAB — BASIC METABOLIC PANEL
ANION GAP: 6 (ref 5–15)
BUN: 44 mg/dL — ABNORMAL HIGH (ref 8–23)
CALCIUM: 10.1 mg/dL (ref 8.9–10.3)
CO2: 27 mmol/L (ref 22–32)
Chloride: 103 mmol/L (ref 98–111)
Creatinine, Ser: 2.42 mg/dL — ABNORMAL HIGH (ref 0.44–1.00)
GFR calc Af Amer: 19 mL/min — ABNORMAL LOW (ref >60)
GFR, EST NON AFRICAN AMERICAN: 16 mL/min — AB (ref >60)
GLUCOSE: 127 mg/dL — AB (ref 70–99)
POTASSIUM: 3.3 mmol/L — AB (ref 3.5–5.1)
SODIUM: 136 mmol/L (ref 135–145)

## 2018-06-27 MED ORDER — NITROGLYCERIN 0.4 MG SL SUBL: 0.4000 mg | SUBLINGUAL_TABLET | SUBLINGUAL | 12 refills | Status: AC | PRN

## 2018-06-27 MED ORDER — POTASSIUM CHLORIDE CRYS ER 20 MEQ PO TBCR
30.0000 meq | EXTENDED_RELEASE_TABLET | ORAL | Status: AC
  Administered 2018-06-27 (×2): 30 meq via ORAL
  Filled 2018-06-27 (×2): qty 1

## 2018-06-27 MED ORDER — TICAGRELOR 90 MG PO TABS: 90.0000 mg | ORAL_TABLET | Freq: Two times a day (BID) | ORAL | 3 refills | Status: AC

## 2018-06-27 MED ORDER — CARVEDILOL 3.125 MG PO TABS: 3.1250 mg | ORAL_TABLET | Freq: Two times a day (BID) | ORAL | 6 refills | Status: AC

## 2018-06-27 NOTE — Care Management (Signed)
Pt requested 3n1.  DME 3n1 ordered and requested from [facility].  To be delivered from patient's room prior to dc today.    Pt's scripts e-prescribed to [facility], which is not open today.  Per RN, patient only needs to fill Brilinta today, which the patient has a handwritten prescription for.  Pt should take to regular pharmacy.

## 2018-06-27 NOTE — Progress Notes (Signed)
Discharge instructions (including medications) discussed with and copy provided to patient/caregiver. Called family for ride home. Gave Brillinta card.

## 2018-06-27 NOTE — Discharge Instructions (Signed)
Acute Coronary Syndrome Acute coronary syndrome (ACS) is a serious problem in which there is suddenly not enough blood and oxygen reaching the heart. ACS can result in chest pain or a heart attack. What are the causes? This condition may be caused by:  A buildup of fat and cholesterol inside of the arteries (atherosclerosis). This is the most common cause. The buildup (plaque) can cause the blood vessels in your heart (coronary arteries) to become narrow or blocked. Plaque can also break off to form a clot.  A coronary spasm.  A tearing of the coronary artery (spontaneous coronary artery dissection).  Low blood pressure (hypotension).  An abnormal heart beat (arrhythmia).  Using cocaine or methamphetamine.  What increases the risk? The following factors may make you more likely to develop this condition:  Age.  History of chest pain, heart attack, or stroke.  Being female.  Family history of chest pain, heart disease, or stroke.  Smoking.  Inactivity.  Being overweight.  High cholesterol.  High blood pressure (hypertension).  Diabetes.  Excessive alcohol use.  What are the signs or symptoms? Common symptoms of this condition include:  Chest pain. The pain may last long, or may stop and come back (recur). It may feel like: ? Crushing or squeezing. ? Tightness, pressure, fullness, or heaviness.  Arm, neck, jaw, or back pain.  Heartburn or indigestion.  Shortness of breath.  Nausea.  Sudden cold sweats.  Lightheadedness.  Dizziness.  Tiredness (fatigue).  Sometimes there are no symptoms. How is this diagnosed? This condition may be diagnosed through:  An electrocardiogram (ECG). This test records the impulses of the heart.  Blood tests.  A CT scan of the chest.  A coronary angiogram. This procedure checks for a blockage in the coronary arteries.  How is this treated? Treatment for this condition may include:  Oxygen.  Medicines, such  as: ? Antiplatelet medicines and blood-thinning medicines, such as aspirin. These help prevent blood clots. ? Fibrinolytic therapy. This breaks apart a blood clot. ? Blood pressure medicines. ? Nitroglycerin. ? Pain medicine. ? Cholesterol medicine.  A procedure called coronary angioplasty and stenting. This is done to widen a narrowed artery and keep it open.  Coronary artery bypass surgery. This allows blood to pass the blockage to reach your heart.  Cardiac rehabilitation. This is a program that helps improve your health and well-being. It includes exercise training, education, and counseling to help you recover.  Follow these instructions at home: Eating and drinking  Follow a heart-healthy, low-salt (sodium) diet.  Use healthy cooking methods such as roasting, grilling, broiling, baking, poaching, steaming, or stir-frying.  Talk to a dietitian to learn about healthy cooking methods and how to eat less sodium. Medicines  Take over-the-counter and prescription medicines only as told by your health care provider.  Do not take these medicines unless your health care provider approves: ? Nonsteroidal anti-inflammatory drugs (NSAIDs), such as ibuprofen, naproxen, or celecoxib. ? Vitamin supplements that contain vitamin A or vitamin E. ? Hormone replacement therapy that contains estrogen. Activity  Join a cardiac rehabilitation program.  Ask your health care provider: ? What activities and exercises are safe for you. ? If you should follow specific instructions about lifting, driving, or climbing stairs.  If you are taking aspirin and another blood thinning medicine, avoid activities that are likely to result in an injury. The medicines can increase your risk of bleeding. Lifestyle  Do not use any products that contain nicotine or tobacco, such as  cigarettes and e-cigarettes. If you need help quitting, ask your health care provider.  If you drink alcohol and your health care  provider says it is okay to drink, limit your alcohol intake to no more than 1 drink per day. One drink equals 12 oz of beer, 5 oz of wine, or 1 oz of hard liquor.  Maintain a healthy weight. If you need to lose weight, do it in a way that has been approved by your health care provider. General instructions  Tell all your health care providers about your heart condition, including your dentist. Some medicines can increase your risk of arrhythmia.  Manage other health conditions, such as hypertension and diabetes. These conditions affect your heart.  Learn ways to manage stress.  Get screened for depression, and seek treatment if needed.  Monitor your blood pressure if told by your health care provider.  Keep your vaccinations up to date. Get the annual influenza vaccine.  Keep all follow-up visits as told by your health care provider. This is important. Contact a health care provider if:  You feel overwhelmed or sad.  You have trouble with your daily activities. Get help right away if:  You have pain in your chest, neck, arm, jaw, stomach, or back that recurs, and: ? Lasts more than a few minutes. ? Is not relieved by taking the [facility] health care provider prescribed.  You have unexplained: ? Heavy sweating. ? Heartburn or indigestion. ? Shortness of breath. ? Difficulty breathing. ? Nausea or vomiting. ? Fatigue. ? Nervousness or anxiety. ? Weakness. ? Diarrhea. ? Dark stools or blood in the stool.  You have sudden lightheadedness or dizziness.  Your blood pressure is higher than 180/120  You faint.  You feel like hurting yourself or think about taking your own life. These symptoms may represent a serious problem that is an emergency. Do not wait to see if the symptoms will go away. Get medical help right away. Call your local emergency services (911 in the [place] Do not drive yourself to the clinic or hospital. Summary  Acute coronary syndrome (ACS) is a  when there is not enough blood and oxygen being supplied to the heart. ACS can result in chest pain or a heart attack.  Acute coronary syndrome is a medical emergency. If you have any symptoms of this condition, get help right away.  Treatment includes oxygen, medicines, and procedures to open the blocked arteries and restore blood flow. This information is not intended to replace advice given to you by your health care provider. Make sure you discuss any questions you have with your health care provider. Document Released: 08/12/2005 Document Revised: 09/13/2016 Document Reviewed: 09/13/2016 Elsevier Interactive Patient Education  2018 [place]. Groin Site Care Refer to this sheet in the next few weeks. These instructions provide you with information on caring for yourself after your procedure. Your caregiver may also give you more specific instructions. Your treatment has been planned according to current medical practices, but problems sometimes occur. Call your caregiver if you have any problems or questions after your procedure. HOME CARE INSTRUCTIONS  You may shower 24 hours after the procedure. Remove the bandage (dressing) and gently wash the site with plain soap and water. Gently pat the site dry.   Do not apply powder or lotion to the site.   Do not sit in a bathtub, swimming pool, or whirlpool for 5 to 7 days.   No bending, squatting, or lifting anything over 10 pounds (4.5  kg) as directed by your caregiver.   Inspect the site at least twice daily.   Do not drive home if you are discharged the same day of the procedure. Have someone else drive you.   You may drive 24 hours after the procedure unless otherwise instructed by your caregiver.  What to expect:  Any bruising will usually fade within 1 to 2 weeks.   Blood that collects in the tissue (hematoma) may be painful to the touch. It should usually decrease in size and tenderness within 1 to 2 weeks.  SEEK IMMEDIATE  MEDICAL CARE IF:  You have unusual pain at the groin site or down the affected leg.   You have redness, warmth, swelling, or pain at the groin site.   You have drainage (other than a small amount of blood on the dressing).   You have chills.   You have a fever or persistent symptoms for more than 72 hours.   You have a fever and your symptoms suddenly get worse.   Your leg becomes pale, cool, tingly, or numb.  You have heavy bleeding from the site. Hold pressure on the site. Marland Kitchen

## 2018-06-27 NOTE — Discharge Summary (Addendum)
[place] VALVE TEAM  Discharge Summary    Patient ID: [name] MRN: [id number]; DOB: 23-Sep-1923  Admit date: 06/22/2018 Discharge date: 06/27/2018  Primary Care Provider: Denita Lung, MD  Primary Cardiologist: Dr. Ellyn Hack / Dr. Gwenlyn Found (PAD)  Discharge Diagnoses    Principal Problem:   STEMI (ST elevation myocardial infarction) [facility]) Active Problems:   CKD (chronic kidney disease) stage 4, GFR 15-29 ml/min (HCC)   Aortic valve stenosis, senile calcific   Dyslipidemia, goal LDL below 70   Essential hypertension   Cerebral infarction [facility])   PAD (peripheral artery disease) (HCC)   Aortic atherosclerosis ([place])   Ischemic cardiomyopathy   Carotid artery occlusion   Cerebral aneurysm without rupture   Allergies Allergies  Allergen Reactions  . Codeine Rash    Diagnostic Studies/Procedures    06/22/18 CORONARY ANGIOGRAPHY  Coronary/Graft Acute MI Revascularization  Conclusion     Mid LM to Dist LM lesion is 90% stenosed.  A drug-eluting stent was successfully placed using a STENT SYNERGY DES 3.5X12.  Post intervention, there is a 0% residual stenosis.  Dist LAD lesion is 100% stenosed.  Balloon angioplasty was performed using a BALLOON SAPPHIRE 2.5X15.  Post intervention, there is a 99% residual stenosis. The lesion behaved like a CTO.  Mid RCA lesion is 100% stenosed.  Previously placed 1st Mrg stent (unknown type) is widely patent.  Mid RCA to Dist RCA lesion is 80% stenosed.  Known severe aortic stenosis. Did not cross the aortic valve.     Recommend uninterrupted dual antiplatelet therapy with Aspirin 81mg  daily and Ticagrelor 90mg  twice daily for a minimum of 12 months (ACS - Class Rebecca recommendation).   Inferior ST elevation, likely due to compromised left to right collaterals.  [name] have to manage ischemia and persistent inferior ST elevations medically.  She is pain free at this time.     Severe aortic stenosis.  Unclear if she would be a candidate for TAVR given renal insufficiency and PAD, although, it appears there was no significant right LE PAD, but there was tortuosity.  Watch in ICU.  Continue low dose Angiomax.  Switch Plavix to Brilinta for now.  May need to go back on Plavix later if there are bleeding issues.    _____________  06/22/18 Study Conclusions - Left ventricle: LVOT is narrow with turbulent outlfow. LVEF is   approximately 35 to 40% with vigorous contraction of basal   segments with near mid cavity obliteration and /akinesis of the   distal 1/3/ of the ventricle. The cavity size was mildly reduced. - Aortic valve: AV is severely thickened and calcified with   restricted motion. Peak gradient through the AV is 67 mm Hg. - Mitral valve: Calcified annulus. Mildly thickened leaflets .   There was mild regurgitation. Impressions: - Compared to echo from September 2019, LVEF is depressed with   regional wall motion abnormalities as noted.    History of Present Illness     84 y.o.femalewith PMH of CAD s/p PCI 1st marginal and RCA, severe aortic stenosis with mean gradient of 64, HLD, PAD s/p multiple interventions, bilateral RAS s/p PCI, CVA on ASA and Plavix, h/o DVT, and cerebral aneurysm who was admitted to [facility] with an inferior STEMI on 06/22/18.   She is a very functional lady who lives at home and functions independently and raising a 70 year old child who lives in her home.  She still drives and runs all of her own  errands.  The patient was in her usual state of health until 06/22/2018 when she had sudden onset of chest pain while watching TV.  EMS was called and she was given aspirin and sublingual nitroglycerin without relief.  ECG showed acute inferior ST elevation and code STEMI was activated.  She was brought in emergently to the Cath Lab for revascularization.   Hospital Course     Consultants: none  1. CAD/Inferior STEMI: s/p LM  stent. Collaterals to the RCA through the left system. The distal LAD could not be opened with POBA. Unable to titrate antianginal meds due to HR, hypotension and dizziness. Continue DAPT with ASA, Brillinta. Plan for discharge today.  2. Severe aortic stenosis:echocardiogram with severely thickened and calcified aortic valve; mean gradient of 67.She could be considered for TAVR in several months if she recovers well from her MI. Options may be limited given her PAD. [name] arrange for an appointment with Dr. Angelena Form in the valve clinic in 4-6 weeks to discuss severe AS.   3. Hypertension: stable on low dose coreg  4. Hypokalemia: replace today. Would get BMET at follow up.  5. CKD, stage 4,  secondary to bilateral RAS s/p FHL:KTGYBWLSLH stable ~2.42  6. TDS:KAJGOTLX statin  7. PAD: Severe PAD with multiple chronic occlusions. Right 4th toe necrosis. Plan per Dr. Gwenlyn Found. Not seen by him for a few years. Rebecca [name] arrange and outpatient follow up.   8. Ischemic CM: EF 35-40%. Continue Coreg. No ACEi/ARB due to CKD. Other CHF meds limited by low BP and HR. Home Chlorthalidone 25mg  on hold since admission. With creat up to 2.42 and low Bp  [name] continue to hold this. Closely follow for CHF in the setting of cardiomyopathy and severe AS.  _____________  Discharge Vitals Blood pressure (!) 96/49, pulse 67, temperature 98.1 F (36.7 C), temperature source Oral, resp. rate 19, height 5\' 5"  (1.651 m), weight 60.8 kg, SpO2 99 %.  Filed Weights   06/22/18 0223 06/23/18 2053  Weight: 68 kg 60.8 kg    Labs & Radiologic Studies    CBC No results for input(s): WBC, NEUTROABS, HGB, HCT, MCV, PLT in the last 72 hours. Basic Metabolic Panel Recent Labs    06/25/18 0801 06/26/18 0233 06/27/18 0249  NA 134* 134* 136  K 3.0* 3.4* 3.3*  CL 100 97* 103  CO2 25 24 27   GLUCOSE 162* 150* 127*  BUN 41* 45* 44*  CREATININE 2.53* 2.58* 2.42*  CALCIUM 10.6* 10.4* 10.1  MG 2.3  --   --     Liver Function Tests No results for input(s): AST, ALT, ALKPHOS, BILITOT, PROT, ALBUMIN in the last 72 hours. No results for input(s): LIPASE, AMYLASE in the last 72 hours. Cardiac Enzymes No results for input(s): CKTOTAL, CKMB, CKMBINDEX, TROPONINI in the last 72 hours. BNP Invalid input(s): POCBNP D-Dimer No results for input(s): DDIMER in the last 72 hours. Hemoglobin A1C No results for input(s): HGBA1C in the last 72 hours. Fasting Lipid Panel No results for input(s): CHOL, HDL, LDLCALC, TRIG, CHOLHDL, LDLDIRECT in the last 72 hours. Thyroid Function Tests No results for input(s): TSH, T4TOTAL, T3FREE, THYROIDAB in the last 72 hours.  Invalid input(s): FREET3 _____________  Dg Chest Port 1 View  Result Date: 06/22/2018 CLINICAL DATA:  Chest pain. EXAM: PORTABLE CHEST 1 VIEW COMPARISON:  11/26/2017 FINDINGS: Lungs are adequately inflated without focal airspace consolidation or effusion. Borderline stable cardiomegaly. Remainder of the exam is unchanged. IMPRESSION: No acute cardiopulmonary disease. Electronically  Signed   By: Marin Olp M.D.   On: 06/22/2018 02:43   Vas [place] Burnard Bunting With/wo Tbi  Result Date: 06/05/2018 LOWER EXTREMITY DOPPLER STUDY Indications: Peripheral artery disease. High Risk         Hypertension, hyperlipidemia, coronary artery disease, prior Factors:          CVA. 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  Vascular Interventions: S/p right superficial femoral artery atherectomy with                         PTA in 2006, overlapping stents in 4/00, and cutting                         balloon PTA in 2012. History of right iliac artery stent                         in 2006 and bilateral renal artery stents. Comparison Study: In 1/18, ABI's were 0.79 on the right and 0.64 on the left.                    Patient denies any abdominal and lower extremity pain. Performing Technologist: Guinevere Ferrari RVT  Examination Guidelines: A complete evaluation includes at minimum, Doppler waveform signals and systolic blood pressure reading at the level of bilateral brachial, anterior tibial, and posterior tibial arteries, when vessel segments are accessible. Bilateral testing is considered an integral part of a complete examination. Photoelectric Plethysmograph (PPG) waveforms and toe systolic pressure readings are included as required and additional duplex testing as needed. Limited examinations for reoccurring indications may be performed as noted.  ABI Findings: +---------+------------------+-----+-------------------+--------+ Right    Rt Pressure (mmHg)IndexWaveform           Comment  +---------+------------------+-----+-------------------+--------+ Brachial 165                                                +---------+------------------+-----+-------------------+--------+ ATA      89                0.54 dampened monophasic         +---------+------------------+-----+-------------------+--------+ PTA                             absent                      +---------+------------------+-----+-------------------+--------+ PERO     76                0.46 dampened monophasic         +---------+------------------+-----+-------------------+--------+ Great Toe83                0.50 Abnormal                    +---------+------------------+-----+-------------------+--------+ +---------+------------------+-----+-------------------+-------+ Left     Lt Pressure (mmHg)IndexWaveform           Comment +---------+------------------+-----+-------------------+-------+ Brachial 159                                               +---------+------------------+-----+-------------------+-------+  ATA                             absent                      +---------+------------------+-----+-------------------+-------+ PTA      94                0.57 dampened monophasic        +---------+------------------+-----+-------------------+-------+ PERO     99                0.60 dampened monophasic        +---------+------------------+-----+-------------------+-------+ Great Toe52                0.32 Abnormal                   +---------+------------------+-----+-------------------+-------+ +-------+-----------+-----------+------------+------------+ ABI/TBIToday's ABIToday's TBIPrevious ABIPrevious TBI +-------+-----------+-----------+------------+------------+ Right  0.54       0.50       0.79        0.55         +-------+-----------+-----------+------------+------------+ Left   0.60       0.32       0.64        0.40         +-------+-----------+-----------+------------+------------+ TOES Findings: +----------+---------------+--------+----------+ Right ToesPressure (mmHg)WaveformComment    +----------+---------------+--------+----------+ 1st Digit 83             Abnormaldamped     +----------+---------------+--------+----------+ 2nd Digit                Abnormaldamped     +----------+---------------+--------+----------+ 3rd Digit                        amputation +----------+---------------+--------+----------+ 4th Digit                Abnormaldamped     +----------+---------------+--------+----------+ 5th Digit                Abnormaldamped     +----------+---------------+--------+----------+ +---------+---------------+--------+-------+ Left ToesPressure (mmHg)WaveformComment +---------+---------------+--------+-------+ 1st Digit52             Abnormaldamped  +---------+---------------+--------+-------+ 2nd Digit               Abnormaldamped  +---------+---------------+--------+-------+ 3rd Digit               Abnormaldamped  +---------+---------------+--------+-------+ 4th Digit                Abnormaldamped  +---------+---------------+--------+-------+ 5th Digit               Abnormaldamped  +---------+---------------+--------+-------+  Right ABIs appear decreased compared to prior study on 1/18. Left ABIs appear essentially unchanged compared to prior study on 1/18. See Arterial and Visceral Duplex studies.  Summary: Right: Resting right ankle-brachial index indicates moderate right lower extremity arterial disease. The right toe-brachial index is abnormal. PPG tracings appear dampened. Left: Resting left ankle-brachial index indicates moderate left lower extremity arterial disease. The left toe-brachial index is abnormal. PPG tracings appear dampened.  *See table(s) above for measurements and observations.  Suggest follow up study in 12 months.    Preliminary  LOWER EXTREMITY DOPPLER STUDY Indications: Peripheral artery disease. High Risk         Hypertension, hyperlipidemia, coronary artery disease, prior Factors:          CVA. Other Factors: Known distal left superficial femoral artery occlusin, known  right posterior tibial artery occlusion, and left posterior and                anterior tibial artery occlusions.  Vascular Interventions: S/p right superficial femoral artery atherectomy with                         PTA in 2006, overlapping stents in 4/00, and cutting                         balloon PTA in 2012. History of right iliac artery stent                         in 2006 and bilateral renal artery stents. Comparison Study: In 1/18, ABI's were 0.79 on the right and 0.64 on the left.                   Patient denies any abdominal and lower extremity pain. Performing Technologist: Guinevere Ferrari RVT  Examination Guidelines: A complete evaluation includes at minimum, Doppler waveform signals and systolic blood pressure reading at the level of bilateral brachial, anterior tibial, and posterior tibial arteries, when vessel segments are accessible. Bilateral testing is  considered an integral part of a complete examination. Photoelectric Plethysmograph (PPG) waveforms and toe systolic pressure readings are included as required and additional duplex testing as needed. Limited examinations for reoccurring indications may be performed as noted.  ABI Findings: +---------+------------------+-----+-------------------+--------+ Right    Rt Pressure (mmHg)IndexWaveform           Comment  +---------+------------------+-----+-------------------+--------+ Brachial 165                                                +---------+------------------+-----+-------------------+--------+ ATA      89                0.54 dampened monophasic         +---------+------------------+-----+-------------------+--------+ PTA                             absent                      +---------+------------------+-----+-------------------+--------+ PERO     76                0.46 dampened monophasic         +---------+------------------+-----+-------------------+--------+ Great Toe83                0.50 Abnormal                    +---------+------------------+-----+-------------------+--------+ +---------+------------------+-----+-------------------+-------+ Left     Lt Pressure (mmHg)IndexWaveform           Comment +---------+------------------+-----+-------------------+-------+ Brachial 159                                               +---------+------------------+-----+-------------------+-------+ ATA                             absent                     +---------+------------------+-----+-------------------+-------+  PTA      94                0.57 dampened monophasic        +---------+------------------+-----+-------------------+-------+ PERO     99                0.60 dampened monophasic        +---------+------------------+-----+-------------------+-------+ Great Toe52                0.32 Abnormal                    +---------+------------------+-----+-------------------+-------+ +-------+-----------+-----------+------------+------------+ ABI/TBIToday's ABIToday's TBIPrevious ABIPrevious TBI +-------+-----------+-----------+------------+------------+ Right  0.54       0.50       0.79        0.55         +-------+-----------+-----------+------------+------------+ Left   0.60       0.32       0.64        0.40         +-------+-----------+-----------+------------+------------+ TOES Findings: +----------+---------------+--------+----------+ Right ToesPressure (mmHg)WaveformComment    +----------+---------------+--------+----------+ 1st Digit 83             Abnormaldamped     +----------+---------------+--------+----------+ 2nd Digit                Abnormaldamped     +----------+---------------+--------+----------+ 3rd Digit                        amputation +----------+---------------+--------+----------+ 4th Digit                Abnormaldamped     +----------+---------------+--------+----------+ 5th Digit                Abnormaldamped     +----------+---------------+--------+----------+ +---------+---------------+--------+-------+ Left ToesPressure (mmHg)WaveformComment +---------+---------------+--------+-------+ 1st Digit52             Abnormaldamped  +---------+---------------+--------+-------+ 2nd Digit               Abnormaldamped  +---------+---------------+--------+-------+ 3rd Digit               Abnormaldamped  +---------+---------------+--------+-------+ 4th Digit               Abnormaldamped  +---------+---------------+--------+-------+ 5th Digit               Abnormaldamped  +---------+---------------+--------+-------+  Right ABIs appear decreased compared to prior study on 1/18. Left ABIs appear essentially unchanged compared to prior study on 1/18. See Arterial and Visceral Duplex studies.  Summary: Right: Resting right ankle-brachial index  indicates moderate right lower extremity arterial disease. The right toe-brachial index is abnormal. PPG tracings appear dampened. Left: Resting left ankle-brachial index indicates moderate left lower extremity arterial disease. The left toe-brachial index is abnormal. PPG tracings appear dampened.  *See table(s) above for measurements and observations.  Suggest follow up study in 12 months. Electronically signed by Kathlyn Sacramento MD on 06/05/2018 at 12:45:47 PM.    Final    Vas US Carotid  Result Date: 06/04/2018 Carotid Arterial Duplex Study Indications:  Carotid artery disease. Risk Factors: Hypertension, hyperlipidemia, no history of smoking, coronary               artery disease, prior CVA, PAD. Limitations:  Body habitus, respiration artifact Performing Technologist: Burley Saver RVT  Examination Guidelines: A complete evaluation includes B-mode imaging, spectral Doppler, color Doppler, and power Doppler as needed of all accessible portions of each vessel. Bilateral testing is considered an integral  part of a complete examination. Limited examinations for reoccurring indications may be performed as noted.  Right Carotid Findings: +----------+--------+--------+--------+------------+---------+           PSV cm/sEDV cm/sStenosisDescribe    Comments  +----------+--------+--------+--------+------------+---------+ CCA Prox  73      16                                    +----------+--------+--------+--------+------------+---------+ CCA Mid   64      14                                    +----------+--------+--------+--------+------------+---------+ CCA Distal72      14              heterogenous          +----------+--------+--------+--------+------------+---------+ ICA Prox  274     97      60-79%  calcific    Shadowing +----------+--------+--------+--------+------------+---------+ ICA Mid   123     41              heterogenous           +----------+--------+--------+--------+------------+---------+ ICA Distal31      16                                    +----------+--------+--------+--------+------------+---------+ ECA       126     15              heterogenous          +----------+--------+--------+--------+------------+---------+ +----------+--------+-------+----------------+-------------------+           PSV cm/sEDV cmsDescribe        Arm Pressure (mmHG) +----------+--------+-------+----------------+-------------------+ [id number]             Multiphasic, WNL                    +----------+--------+-------+----------------+-------------------+ +---------+--------+--+--------+--+---------+ VertebralPSV cm/s33EDV cm/s18Antegrade +---------+--------+--+--------+--+---------+  Left Carotid Findings: +----------+--------+--------+--------+------------+---------+           PSV cm/sEDV cm/sStenosisDescribe    Comments  +----------+--------+--------+--------+------------+---------+ CCA Prox  69      18              heterogenous          +----------+--------+--------+--------+------------+---------+ CCA Mid   56      18              calcific              +----------+--------+--------+--------+------------+---------+ CCA Distal64      19              heterogenous          +----------+--------+--------+--------+------------+---------+ ICA Prox  78      25      1-39%   calcific    Shadowing +----------+--------+--------+--------+------------+---------+ ICA Mid   70      26              heterogenous          +----------+--------+--------+--------+------------+---------+ ICA Distal61      20                                    +----------+--------+--------+--------+------------+---------+ ECA  56      19              heterogenous          +----------+--------+--------+--------+------------+---------+  +----------+--------+--------+----------------+-------------------+ SubclavianPSV cm/sEDV cm/sDescribe        Arm Pressure (mmHG) +----------+--------+--------+----------------+-------------------+           74              Multiphasic, WNL                    +----------+--------+--------+----------------+-------------------+ +---------+--------+--+--------+--+---------+ VertebralPSV cm/s28EDV cm/s13Antegrade +---------+--------+--+--------+--+---------+  Summary: Right Carotid: Velocities in the right ICA are consistent with a 60-79%                stenosis. Calcific plaque may obscure higher velocities. Left Carotid: Velocities in the left ICA are consistent with a 1-39% stenosis.               Calcific plaque may obscure higher velocities. Vertebrals:  Bilateral vertebral arteries demonstrate antegrade flow. Subclavians: Normal flow hemodynamics were seen in bilateral subclavian              arteries. *See table(s) above for measurements and observations.  Electronically signed by Ruta Hinds MD on 06/04/2018 at 4:58:43 PM.    Final    Vas [place] Lower Extremity Arterial Duplex  Result Date: 06/05/2018 LOWER EXTREMITY ARTERIAL DUPLEX STUDY Indications: Peripheral artery disease. High Risk         Hypertension, hyperlipidemia, coronary artery disease, prior Factors:          CVA. 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  Vascular Interventions: S/p right superficial femoral artery atherectomy with                         PTA in 2006, overlapping stents in 4/00, and cutting                         balloon PTA in 2012. History of right iliac artery stent                         in 2006 and bilateral renal artery stents. Current ABI:            0.54 on the right and 0.60 on the left. Comparison Study: In 1/18, ABI's were 0.79 on the  right and 0.64 on the left.                   Patient denies any abdominal and lower extremity pain. Performing Technologist: Guinevere Ferrari RVT  Examination Guidelines: A complete evaluation includes B-mode imaging, spectral Doppler, color Doppler, and power Doppler as needed of all accessible portions of each vessel. Bilateral testing is considered an integral part of a complete examination. Limited examinations for reoccurring indications may be performed as noted.  Right Duplex Findings: +----------+--------+-----+--------+----------+--------------------+           PSV cm/sRatioStenosisWaveform  Comments             +----------+--------+-----+--------+----------+--------------------+ CFA Prox  71  biphasic  heterogeneous plaque +----------+--------+-----+--------+----------+--------------------+ DFA       171                  biphasic  heterogeneous plaque +----------+--------+-----+--------+----------+--------------------+ SFA Prox                                                      +----------+--------+-----+--------+----------+--------------------+ POP Prox  28                   monophasicheterogeneous plaque +----------+--------+-----+--------+----------+--------------------+ POP Distal14                   monophasicheterogeneous plaque +----------+--------+-----+--------+----------+--------------------+  Right Stent(s): +---------------+--------+---------------+----------+--------------------------+ SFA            PSV cm/sStenosis       Waveform  Comments                   +---------------+--------+---------------+----------+--------------------------+ Prox to Stent  142                    biphasic  heterogeneous plaque       +---------------+--------+---------------+----------+--------------------------+ Proximal Stent 76                     monophasicheterogeneous plaque with                                                   shadowing                  +---------------+--------+---------------+----------+--------------------------+ Mid Stent      89                     monophasicheterogeneous plaque       +---------------+--------+---------------+----------+--------------------------+ Distal Stent   290     50-99% stenosismonophasicheterogeneous plaque with                                                  collateral flow            +---------------+--------+---------------+----------+--------------------------+ Distal to Stent23                     monophasicheterogeneous plaque       +---------------+--------+---------------+----------+--------------------------+  Summary: Right: Patent right SFA stent. >50% in-stent restenosis in the distal SFA. See Arterial Doppler and Visceral Duplex.  See table(s) above for measurements and observations. Suggest follow up study in 12 months.    Preliminary  LOWER EXTREMITY ARTERIAL DUPLEX STUDY Indications: Peripheral artery disease. High Risk         Hypertension, hyperlipidemia, coronary artery disease, prior Factors:          CVA. 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  Vascular Interventions: S/p right superficial femoral artery atherectomy with  PTA in 2006, overlapping stents in 4/00, and cutting                         balloon PTA in 2012. History of right iliac artery stent                         in 2006 and bilateral renal artery stents. Current ABI:            0.54 on the right and 0.60 on the left. Comparison Study: In 1/18, ABI's were 0.79 on the right and 0.64 on the left.                   Patient denies any abdominal and lower extremity pain. Performing Technologist: Guinevere Ferrari RVT  Examination Guidelines: A complete evaluation includes B-mode imaging, spectral Doppler,  color Doppler, and power Doppler as needed of all accessible portions of each vessel. Bilateral testing is considered an integral part of a complete examination. Limited examinations for reoccurring indications may be performed as noted.  Right Duplex Findings: +----------+--------+-----+--------+----------+--------------------+           PSV cm/sRatioStenosisWaveform  Comments             +----------+--------+-----+--------+----------+--------------------+ CFA Prox  71                   biphasic  heterogeneous plaque +----------+--------+-----+--------+----------+--------------------+ DFA       171                  biphasic  heterogeneous plaque +----------+--------+-----+--------+----------+--------------------+ SFA Prox                                                      +----------+--------+-----+--------+----------+--------------------+ POP Prox  28                   monophasicheterogeneous plaque +----------+--------+-----+--------+----------+--------------------+ POP Distal14                   monophasicheterogeneous plaque +----------+--------+-----+--------+----------+--------------------+  Right Stent(s): +---------------+--------+---------------+----------+--------------------------+ SFA            PSV cm/sStenosis       Waveform  Comments                   +---------------+--------+---------------+----------+--------------------------+ Prox to Stent  142                    biphasic  heterogeneous plaque       +---------------+--------+---------------+----------+--------------------------+ Proximal Stent 76                     monophasicheterogeneous plaque with                                                  shadowing                  +---------------+--------+---------------+----------+--------------------------+ Mid Stent      89                     monophasicheterogeneous plaque        +---------------+--------+---------------+----------+--------------------------+ Distal Stent   290  50-99% stenosismonophasicheterogeneous plaque with                                                  collateral flow            +---------------+--------+---------------+----------+--------------------------+ Distal to Stent23                     monophasicheterogeneous plaque       +---------------+--------+---------------+----------+--------------------------+  Summary: Right: Patent right SFA stent. >50% in-stent restenosis in the distal SFA. See Arterial Doppler and Visceral Duplex.  See table(s) above for measurements and observations. Suggest follow up study in 12 months. Electronically signed by Kathlyn Sacramento MD on 06/05/2018 at 12:43:02 PM.    Final    Vas US Aorta/ivc/iliacs  Result Date: 06/05/2018 ABDOMINAL AORTA STUDY Indications: PAD, s/p right SFA and iliac artery stents. Risk Factors: Hypertension, hyperlipidemia, coronary artery disease, prior CVA. 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 Vascular Interventions: S/p right superficial femoral artery atherectomy with                         PTA in 2006, overlapping stents in 4/00, and cutting                         balloon PTA in 2012. History of right iliac artery stent                         in 2006 and bilateral renal artery stents. Limitations: Obesity and air/bowel gas.  Comparison Study: In 1/18, ABI's were 0.79 on the right and 0.64 on the left.                   Today's ABI's were 0.54 on the right and 0.60 on the left.                   Patient denies any abdominal and lower extremity pain. Performing Technologist: Guinevere Ferrari RVT  Examination Guidelines: A complete evaluation includes B-mode imaging, spectral Doppler, color Doppler, and power Doppler as  needed of all accessible portions of each vessel. Bilateral testing is considered an integral part of a complete examination. Limited examinations for reoccurring indications may be performed as noted.  Abdominal Aorta Findings: +-------------+-------+----------+----------+--------+---------+--------+ Location     AP (cm)Trans (cm)PSV (cm/s)WaveformThrombus Comments +-------------+-------+----------+----------+--------+---------+--------+ Proximal     1.52   1.52      65                                  +-------------+-------+----------+----------+--------+---------+--------+ Mid                           57                calcified         +-------------+-------+----------+----------+--------+---------+--------+ Distal  73        biphasiccalcified         +-------------+-------+----------+----------+--------+---------+--------+ RT EIA Prox                   67        biphasiccalcified         +-------------+-------+----------+----------+--------+---------+--------+ LT CIA Prox                   79        biphasiccalcified         +-------------+-------+----------+----------+--------+---------+--------+ LT CIA Mid                    73        biphasiccalcified         +-------------+-------+----------+----------+--------+---------+--------+ LT CIA Distal                 95                calcified         +-------------+-------+----------+----------+--------+---------+--------+ LT EIA Prox                   95        biphasic                  +-------------+-------+----------+----------+--------+---------+--------+  Visualization of the Proximal Abdominal Aorta, Right EIA Proximal artery and Left EIA Proximal artery was limited. IVC/Iliac Findings: +--------+------+--------+-------+         PatentThrombusComment +--------+------+--------+-------+ IVC Proxpatent                +--------+------+--------+-------+   Right Stent(s): +-------------------+--------+--------+--------+--------+ common iliac arteryPSV cm/sStenosisWaveformComments +-------------------+--------+--------+--------+--------+ Prox to Stent      49                               +-------------------+--------+--------+--------+--------+ Proximal Stent     96                               +-------------------+--------+--------+--------+--------+ Mid Stent          86              biphasic         +-------------------+--------+--------+--------+--------+ Distal Stent       94              biphasic         +-------------------+--------+--------+--------+--------+ Distal to Stent    95                               +-------------------+--------+--------+--------+--------+  Summary: Abdominal Aorta: No evidence of an abdominal aortic aneurysm was visualized. The largest aortic measurement is 1.5 cm. Stenosis: Patent right common iliac artery stent with no evidence of restenosis. See Arterial Doppler and Duplex studies.  *See table(s) above for measurements and observations. Suggest follow up study in 12 months.     Preliminary  ABDOMINAL AORTA STUDY Indications: PAD, s/p right SFA and iliac artery stents. Risk Factors: Hypertension, hyperlipidemia, coronary artery disease, prior CVA. Other Factors: Known distal left superficial femoral artery occlusin, known                right posterior tibial artery occlusion, and left posterior and  anterior tibial artery occlusions. Vascular Interventions: S/p right superficial femoral artery atherectomy with                         PTA in 2006, overlapping stents in 4/00, and cutting                         balloon PTA in 2012. History of right iliac artery stent                         in 2006 and bilateral renal artery stents. Limitations: Obesity and air/bowel gas.  Comparison Study: In 1/18, ABI's were 0.79 on the right and 0.64 on the left.                   Today's ABI's  were 0.54 on the right and 0.60 on the left.                   Patient denies any abdominal and lower extremity pain. Performing Technologist: Guinevere Ferrari RVT  Examination Guidelines: A complete evaluation includes B-mode imaging, spectral Doppler, color Doppler, and power Doppler as needed of all accessible portions of each vessel. Bilateral testing is considered an integral part of a complete examination. Limited examinations for reoccurring indications may be performed as noted.  Abdominal Aorta Findings: +-------------+-------+----------+----------+--------+---------+--------+ Location     AP (cm)Trans (cm)PSV (cm/s)WaveformThrombus Comments +-------------+-------+----------+----------+--------+---------+--------+ Proximal     1.52   1.52      65                                  +-------------+-------+----------+----------+--------+---------+--------+ Mid                           57                calcified         +-------------+-------+----------+----------+--------+---------+--------+ Distal                        52        biphasiccalcified         +-------------+-------+----------+----------+--------+---------+--------+ RT EIA Prox                   67        biphasiccalcified         +-------------+-------+----------+----------+--------+---------+--------+ LT CIA Prox                   79        biphasiccalcified         +-------------+-------+----------+----------+--------+---------+--------+ LT CIA Mid                    73        biphasiccalcified         +-------------+-------+----------+----------+--------+---------+--------+ LT CIA Distal                 95                calcified         +-------------+-------+----------+----------+--------+---------+--------+ LT EIA Prox                   95        biphasic                  +-------------+-------+----------+----------+--------+---------+--------+  Visualization of the  Proximal Abdominal Aorta, Right EIA Proximal artery and Left EIA Proximal artery was limited. IVC/Iliac Findings: +--------+------+--------+-------+         PatentThrombusComment +--------+------+--------+-------+ IVC Proxpatent                +--------+------+--------+-------+  Right Stent(s): +-------------------+--------+--------+--------+--------+ common iliac arteryPSV cm/sStenosisWaveformComments +-------------------+--------+--------+--------+--------+ Prox to Stent      49                               +-------------------+--------+--------+--------+--------+ Proximal Stent     96                               +-------------------+--------+--------+--------+--------+ Mid Stent          86              biphasic         +-------------------+--------+--------+--------+--------+ Distal Stent       94              biphasic         +-------------------+--------+--------+--------+--------+ Distal to Stent    95                               +-------------------+--------+--------+--------+--------+  Summary: Abdominal Aorta: No evidence of an abdominal aortic aneurysm was visualized. The largest aortic measurement is 1.5 cm. Stenosis: Patent right common iliac artery stent with no evidence of restenosis. See Arterial Doppler and Duplex studies.  *See table(s) above for measurements and observations. Suggest follow up study in 12 months.  Electronically signed by Kathlyn Sacramento MD on 06/05/2018 at 12:41:44 PM.    Final    Disposition   Pt is being discharged home today in good condition.  Follow-up Plans & Appointments    Follow-up Information    Leonie Man, MD Follow up.   Specialty:  Cardiology Why:  The office [facility] call you to make an appointment with Dr. Darcus Pester NP or PAs in 1-2 weeks Contact information: [address] [place] [address] [phone]        Lorretta Harp, MD Follow up.   Specialties:  Cardiology,  Radiology Why:  The office [facility] call you to arrange an appointmnet with Dr. Gwenlyn Found to discuss your peripheral artery disease Contact information: [address] [place] [address] [phone]        Burnell Blanks, MD. Go on 07/31/2018.   Specialty:  Cardiology Why:  @ 9:30am for follow up of your bad valve Contact information: [place]. [address]  [address] [phone]          Discharge Instructions    AMB Referral to Cardiac Rehabilitation - Phase II   Complete by:  As directed    Diagnosis:  STEMI   Amb Referral to Cardiac Rehabilitation   Complete by:  As directed    Diagnosis:   STEMI Coronary Stents        Discharge Medications   Allergies as of 06/27/2018      Reactions   Codeine Rash      Medication List    STOP taking these medications   amLODipine 5 MG tablet Commonly known as:  NORVASC   chlorthalidone 25 MG tablet Commonly known as:  HYGROTON   clopidogrel 75 MG tablet Commonly known  as:  PLAVIX     TAKE these medications   acetaminophen 500 MG tablet Commonly known as:  TYLENOL Take 500 mg by mouth every 6 (six) hours as needed for mild pain, moderate pain or headache.   aspirin 81 MG EC tablet Take 1 tablet (81 mg total) by mouth daily. Swallow whole.   carvedilol 3.125 MG tablet Commonly known as:  COREG Take 1 tablet (3.125 mg total) by mouth 2 (two) times daily with a meal.   FLUoxetine 10 MG capsule Commonly known as:  PROZAC TAKE 1 CAPSULE(10 MG) BY MOUTH DAILY What changed:  See the new instructions.   nitroGLYCERIN 0.4 MG SL tablet Commonly known as:  NITROSTAT Place 1 tablet (0.4 mg total) under the tongue every 5 (five) minutes x 3 doses as needed for chest pain.   OVER THE COUNTER MEDICATION Take 1 capsule by mouth daily as needed (constipation). STOOL SOFTNER   pantoprazole 40 MG tablet Commonly known as:  PROTONIX TAKE 1 TABLET BY MOUTH DAILY 45 MINUTES BEFORE  BREAKFAST What changed:    how much to take  how to take this  when to take this  additional instructions   potassium chloride 20 MEQ/15ML (10%) Soln TAKE 15 MLS BY MOUTH TWICE DAILY What changed:  See the new instructions.   rosuvastatin 20 MG tablet Commonly known as:  CRESTOR TAKE 1 TABLET(20 MG) BY MOUTH AT BEDTIME What changed:  See the new instructions.   SOOTHE OP Place 1 drop into both eyes 2 (two) times daily.   ticagrelor 90 MG Tabs tablet Commonly known as:  BRILINTA Take 1 tablet (90 mg total) by mouth 2 (two) times daily.   traMADol 50 MG tablet Commonly known as:  ULTRAM Take 1 tablet (50 mg total) by mouth every 8 (eight) hours as needed.         Outstanding Labs/Studies   BMET  Duration of Discharge Encounter   Greater than 30 minutes including physician time.  Signed, Angelena Form, PA-C 06/27/2018, 12:54 PM [phone]  Rebecca have seen and examined this patient with Angelena Form.  Agree with above, note added to reflect my findings.  On exam, RRR, no murmurs, lungs clear. Admit for STEMI. Had POBA of LAD with collaterals to RCA.   chest pain free. Follow up in clinic.  Kasiyah Platter M. Cherrie Franca MD 06/28/2018 7:49 AM

## 2018-06-27 NOTE — Progress Notes (Signed)
Progress Note  Patient Name: [name] Date of Encounter: 06/27/2018  Primary Cardiologist: Dr. Ellyn Hack   Subjective   Feeling well today. No chest pain. Feels dizziness is due to not wearing glasses.  Inpatient Medications    Scheduled Meds: . aspirin  81 mg Oral Daily  . carvedilol  3.125 mg Oral BID WC  . feeding supplement (ENSURE ENLIVE)  237 mL Oral BID BM  . FLUoxetine  10 mg Oral Daily  . mouth rinse  15 mL Mouth Rinse BID  . multivitamin with minerals  1 tablet Oral Daily  . pantoprazole  40 mg Oral Daily  . rosuvastatin  20 mg Oral Daily  . sodium chloride flush  3 mL Intravenous Q12H  . ticagrelor  90 mg Oral BID   Continuous Infusions: . sodium chloride     PRN Meds: sodium chloride, acetaminophen, morphine injection, nitroGLYCERIN, ondansetron (ZOFRAN) IV, sodium chloride flush, traMADol   Vital Signs    Vitals:   06/26/18 2015 06/26/18 2316 06/27/18 0355 06/27/18 0808  BP: 112/68 97/62 111/63 107/65  Pulse: 70 63  82  Resp: 18 14  15   Temp: 97.9 F (36.6 C) 98.1 F (36.7 C) (!) 97.4 F (36.3 C) 97.7 F (36.5 C)  TempSrc: Oral Oral Oral Oral  SpO2: 99% 99%  99%  Weight:      Height:        Intake/Output Summary (Last 24 hours) at 06/27/2018 1040 Last data filed at 06/27/2018 0600 Gross per 24 hour  Intake 1023.76 ml  Output 700 ml  Net 323.76 ml   Filed Weights   06/22/18 0223 06/23/18 2053  Weight: 68 kg 60.8 kg    Telemetry    SR- Personally Reviewed  ECG    N/A  Physical Exam    GEN: Well nourished, well developed, in no acute distress  HEENT: normal  Neck: no JVD, carotid bruits, or masses Cardiac: RRR; harsh systolic murmur at the base, no rubs, or gallops,no edema  Respiratory:  clear to auscultation bilaterally, normal work of breathing GI: soft, nontender, nondistended, + BS MS: no deformity or atrophy  Skin: warm and dry, right 4th toe tip black Neuro:  Strength and sensation are intact Psych: euthymic  mood, full affect    Labs    Chemistry Recent Labs  Lab 06/22/18 0225  06/25/18 0801 06/26/18 0233 06/27/18 0249  NA 136   < > 134* 134* 136  K 3.1*   < > 3.0* 3.4* 3.3*  CL 104   < > 100 97* 103  CO2 22   < > 25 24 27   GLUCOSE 167*   < > 162* 150* 127*  BUN 25*   < > 41* 45* 44*  CREATININE 2.24*   < > 2.53* 2.58* 2.42*  CALCIUM 10.8*   < > 10.6* 10.4* 10.1  PROT 7.1  --   --   --   --   ALBUMIN 4.1  --   --   --   --   AST 24  --   --   --   --   ALT 13  --   --   --   --   ALKPHOS 82  --   --   --   --   BILITOT 0.6  --   --   --   --   GFRNONAA 18*   < > 15* 15* 16*  GFRAA 21*   < > 18* 17* 19*  ANIONGAP 10   < > 9 13 6    < > = values in this interval not displayed.     Hematology Recent Labs  Lab 06/22/18 0225 06/22/18 0312 06/23/18 0318  WBC 8.7  --  10.4  RBC 3.54*  --  3.37*  HGB 11.3* 12.6 11.0*  HCT 36.9 37.0 33.8*  MCV 104.2*  --  100.3*  MCH 31.9  --  32.6  MCHC 30.6  --  32.5  RDW 12.4  --  12.4  PLT 150  --  147*    Cardiac Enzymes Recent Labs  Lab 06/22/18 0225 06/22/18 0637 06/22/18 1236 06/22/18 1833  TROPONINI 0.07* 0.87* 17.51* 42.62*   No results for input(s): TROPIPOC in the last 168 hours.   BNPNo results for input(s): BNP, PROBNP in the last 168 hours.   DDimer No results for input(s): DDIMER in the last 168 hours.   Radiology    No results found.  Cardiac Studies   LHC 06/22/2018:   Mid LM to Dist LM lesion is 90% stenosed.  A drug-eluting stent was successfully placed using a STENT SYNERGY DES 3.5X12.  Post intervention, there is a 0% residual stenosis.  Dist LAD lesion is 100% stenosed.  Balloon angioplasty was performed using a BALLOON SAPPHIRE 2.5X15.  Post intervention, there is a 99% residual stenosis. The lesion behaved like a CTO.  Mid RCA lesion is 100% stenosed.  Previously placed 1st Mrg stent (unknown type) is widely patent.  Mid RCA to Dist RCA lesion is 80% stenosed.  Known severe aortic  stenosis. Did not cross the aortic valve.  Recommend uninterrupted dual antiplatelet therapy with Aspirin 81mg  daily and Ticagrelor 90mg  twice dailyfor a minimum of 12 months (ACS - Class I recommendation).  Inferior ST elevation, likely due to compromised left to right collaterals. Stokely Jeancharles have to manage ischemia and persistent inferior ST elevations medically. She is pain free at this time.   Severe aortic stenosis. Unclear if she would be a candidate for TAVR given renal insufficiency and PAD, although, it appears there was no significant right LE PAD, but there was tortuosity.  Echocardiogram 06/22/2018: Study Conclusions - Left ventricle: LVOT is narrow with turbulent outlfow. LVEF is approximately 35 to 40% with vigorous contraction of basal segments with near mid cavity obliteration and /akinesis of the distal 1/3/ of the ventricle. The cavity size was mildly reduced. - Aortic valve: AV is severely thickened and calcified with restricted motion. Peak gradient through the AV is 67 mm Hg. - Mitral valve: Calcified annulus. Mildly thickened leaflets . There was mild regurgitation.  Impressions: - Compared to echo from September 2019, LVEF is depressed with regional wall motion abnormalities as noted.   Patient Profile     84 y.o. female with PMH of CAD s/p PCI 1st marginal and RCA, severe aortic stenosis with mean gradient of 64, HLD, PAD s/p multiple interventions, bilateral RAS s/p PCI, CVA on ASA and Plavix, h/o DVT, and cerebral aneurysm who was admitted with an inferior STEMI on 06/22/18. Severe disease in the distal left main artery with collaterals to the RCA supplied by the left coronary system. A distal left main stent was placed. The distal LAD could not be opened. LVEF 35-40% by echo post MI. Severe AS again noted on echo.   Assessment & Plan    1. CAD/Inferior STEMI: s/p LM stent. Collaterals to the RCA through the left system. Unable to titrate  antianginal meds due to HR. Continue DAPT with ASA,  Brillinta. Plan for discharge today.  2. Severe aortic stenosis:Severe on TTE. Potential TAVR candidate depending on MI recovery.   3. Hypertension: stable on coreg  4. Hypokalemia: replace today   5. CKD, stage 3,  secondary to bilateral RAS s/p EYE:[id number] stable  6. PNP:[id number] statin  7. PAD: Severe PAD with multiple chronic occlusions. Right 4th toe necrosis. Plan per Dr. Gwenlyn Found.      For questions or updates, please contact [place] Please consult [email or web address] for contact info under     Signed, Keonta Alsip Meredith Leeds, MD  06/27/2018, 10:40 AM

## 2018-06-27 NOTE — Plan of Care (Signed)

## 2018-06-28 NOTE — Discharge Summary (Signed)
[name] VALVE TEAM  Discharge Summary    Patient ID: [name] MRN: [id number]; DOB: 1923-12-24  Admit date: 06/22/2018 Discharge date: 06/28/2018  Primary Care Provider: Denita Lung, MD  Primary Cardiologist: Dr. Ellyn Hack / Dr. Gwenlyn Found (PAD)  Discharge Diagnoses    Principal Problem:   STEMI (ST elevation myocardial infarction) [facility]) Active Problems:   CKD (chronic kidney disease) stage 4, GFR 15-29 ml/min (HCC)   Aortic valve stenosis, senile calcific   Dyslipidemia, goal LDL below 70   Essential hypertension   Cerebral infarction [facility])   PAD (peripheral artery disease) ([place])   Aortic atherosclerosis ([place])   Ischemic cardiomyopathy   Carotid artery occlusion   Cerebral aneurysm without rupture   Allergies Allergies  Allergen Reactions  . Codeine Rash    Diagnostic Studies/Procedures    06/22/18 CORONARY ANGIOGRAPHY  Coronary/Graft Acute MI Revascularization  Conclusion     Mid LM to Dist LM lesion is 90% stenosed.  A drug-eluting stent was successfully placed using a STENT SYNERGY DES 3.5X12.  Post intervention, there is a 0% residual stenosis.  Dist LAD lesion is 100% stenosed.  Balloon angioplasty was performed using a BALLOON SAPPHIRE 2.5X15.  Post intervention, there is a 99% residual stenosis. The lesion behaved like a CTO.  Mid RCA lesion is 100% stenosed.  Previously placed 1st Mrg stent (unknown type) is widely patent.  Mid RCA to Dist RCA lesion is 80% stenosed.  Known severe aortic stenosis. Did not cross the aortic valve.     Recommend uninterrupted dual antiplatelet therapy with Aspirin 81mg  daily and Ticagrelor 90mg  twice daily for a minimum of 12 months (ACS - Class I recommendation).   Inferior ST elevation, likely due to compromised left to right collaterals.  Will have to manage ischemia and persistent inferior ST elevations medically.  She is pain free at this time.     Severe aortic stenosis.  Unclear if she would be a candidate for TAVR given renal insufficiency and PAD, although, it appears there was no significant right LE PAD, but there was tortuosity.  Watch in ICU.  Continue low dose Angiomax.  Switch Plavix to Brilinta for now.  May need to go back on Plavix later if there are bleeding issues.    _____________  06/22/18 Study Conclusions - Left ventricle: LVOT is narrow with turbulent outlfow. LVEF is   approximately 35 to 40% with vigorous contraction of basal   segments with near mid cavity obliteration and /akinesis of the   distal 1/3/ of the ventricle. The cavity size was mildly reduced. - Aortic valve: AV is severely thickened and calcified with   restricted motion. Peak gradient through the AV is 67 mm Hg. - Mitral valve: Calcified annulus. Mildly thickened leaflets .   There was mild regurgitation. Impressions: - Compared to echo from September 2019, LVEF is depressed with   regional wall motion abnormalities as noted.    History of Present Illness     84 y.o.femalewith PMH of CAD s/p PCI 1st marginal and RCA, severe aortic stenosis with mean gradient of 64, HLD, PAD s/p multiple interventions, bilateral RAS s/p PCI, CVA on ASA and Plavix, h/o DVT, and cerebral aneurysm who was admitted to [facility] with an inferior STEMI on 06/22/18.   She is a very functional lady who lives at home and functions independently and raising a 44 year old child who lives in her home.  She still drives and runs all of her own  errands.  The patient was in her usual state of health until 06/22/2018 when she had sudden onset of chest pain while watching TV.  EMS was called and she was given aspirin and sublingual nitroglycerin without relief.  ECG showed acute inferior ST elevation and code STEMI was activated.  She was brought in emergently to the Cath Lab for revascularization.   Hospital Course     Consultants: none  1. CAD/Inferior STEMI: s/p LM  stent. Collaterals to the RCA through the left system. The distal LAD could not be opened with POBA. Unable to titrate antianginal meds due to HR, hypotension and dizziness. Continue DAPT with ASA, Brillinta. Plan for discharge today.  2. Severe aortic stenosis:echocardiogram with severely thickened and calcified aortic valve; mean gradient of 67.She could be considered for TAVR in several months if she recovers well from her MI. Options may be limited given her PAD. Will arrange for an appointment with Dr. Angelena Form in the valve clinic in 4-6 weeks to discuss severe AS.   3. Hypertension: stable on low dose coreg  4. Hypokalemia: replace today. Would get BMET at follow up.  5. CKD, stage 4,  secondary to bilateral RAS s/p XBJ:YNWGNFAOZH stable ~2.42  6. YQM:VHQIONGE statin  7. PAD: Severe PAD with multiple chronic occlusions. Right 4th toe necrosis. Plan per Dr. Gwenlyn Found. Not seen by him for a few years. I will arrange and outpatient follow up.   8. Ischemic CM: EF 35-40%. Continue Coreg. No ACEi/ARB due to CKD. Other CHF meds limited by low BP and HR. Home Chlorthalidone 25mg  on hold since admission. With creat up to 2.42 and low Bp  I will continue to hold this. Closely follow for CHF in the setting of cardiomyopathy and severe AS.  _____________  Discharge Vitals Blood pressure (!) 96/49, pulse 67, temperature 98.1 F (36.7 C), temperature source Oral, resp. rate 18, height 5\' 5"  (1.651 m), weight 60.8 kg, SpO2 100 %.  Filed Weights   06/22/18 0223 06/23/18 2053  Weight: 68 kg 60.8 kg    Labs & Radiologic Studies    CBC No results for input(s): WBC, NEUTROABS, HGB, HCT, MCV, PLT in the last 72 hours. Basic Metabolic Panel Recent Labs    06/26/18 0233 06/27/18 0249  NA 134* 136  K 3.4* 3.3*  CL 97* 103  CO2 24 27  GLUCOSE 150* 127*  BUN 45* 44*  CREATININE 2.58* 2.42*  CALCIUM 10.4* 10.1   Liver Function Tests No results for input(s): AST, ALT, ALKPHOS, BILITOT,  PROT, ALBUMIN in the last 72 hours. No results for input(s): LIPASE, AMYLASE in the last 72 hours. Cardiac Enzymes No results for input(s): CKTOTAL, CKMB, CKMBINDEX, TROPONINI in the last 72 hours. BNP Invalid input(s): POCBNP D-Dimer No results for input(s): DDIMER in the last 72 hours. Hemoglobin A1C No results for input(s): HGBA1C in the last 72 hours. Fasting Lipid Panel No results for input(s): CHOL, HDL, LDLCALC, TRIG, CHOLHDL, LDLDIRECT in the last 72 hours. Thyroid Function Tests No results for input(s): TSH, T4TOTAL, T3FREE, THYROIDAB in the last 72 hours.  Invalid input(s): FREET3 _____________  Dg Chest Port 1 View  Result Date: 06/22/2018 CLINICAL DATA:  Chest pain. EXAM: PORTABLE CHEST 1 VIEW COMPARISON:  11/26/2017 FINDINGS: Lungs are adequately inflated without focal airspace consolidation or effusion. Borderline stable cardiomegaly. Remainder of the exam is unchanged. IMPRESSION: No acute cardiopulmonary disease. Electronically Signed   By: Marin Olp M.D.   On: 06/22/2018 02:43   Vas [place] Burnard Bunting With/wo  Tbi  Result Date: 06/05/2018 LOWER EXTREMITY DOPPLER STUDY Indications: Peripheral artery disease. High Risk         Hypertension, hyperlipidemia, coronary artery disease, prior Factors:          CVA. 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  Vascular Interventions: S/p right superficial femoral artery atherectomy with                         PTA in 2006, overlapping stents in 4/00, and cutting                         balloon PTA in 2012. History of right iliac artery stent                         in 2006 and bilateral renal artery stents. Comparison Study: In 1/18, ABI's were 0.79 on the right and 0.64 on the left.                   Patient denies any abdominal and lower extremity pain. Performing Technologist:  Guinevere Ferrari RVT  Examination Guidelines: A complete evaluation includes at minimum, Doppler waveform signals and systolic blood pressure reading at the level of bilateral brachial, anterior tibial, and posterior tibial arteries, when vessel segments are accessible. Bilateral testing is considered an integral part of a complete examination. Photoelectric Plethysmograph (PPG) waveforms and toe systolic pressure readings are included as required and additional duplex testing as needed. Limited examinations for reoccurring indications may be performed as noted.  ABI Findings: +---------+------------------+-----+-------------------+--------+ Right    Rt Pressure (mmHg)IndexWaveform           Comment  +---------+------------------+-----+-------------------+--------+ Brachial 165                                                +---------+------------------+-----+-------------------+--------+ ATA      89                0.54 dampened monophasic         +---------+------------------+-----+-------------------+--------+ PTA                             absent                      +---------+------------------+-----+-------------------+--------+ PERO     76                0.46 dampened monophasic         +---------+------------------+-----+-------------------+--------+ Great Toe83                0.50 Abnormal                    +---------+------------------+-----+-------------------+--------+ +---------+------------------+-----+-------------------+-------+ Left     Lt Pressure (mmHg)IndexWaveform           Comment +---------+------------------+-----+-------------------+-------+ Brachial 159                                               +---------+------------------+-----+-------------------+-------+  ATA                             absent                     +---------+------------------+-----+-------------------+-------+ PTA      94                0.57 dampened  monophasic        +---------+------------------+-----+-------------------+-------+ PERO     99                0.60 dampened monophasic        +---------+------------------+-----+-------------------+-------+ Great Toe52                0.32 Abnormal                   +---------+------------------+-----+-------------------+-------+ +-------+-----------+-----------+------------+------------+ ABI/TBIToday's ABIToday's TBIPrevious ABIPrevious TBI +-------+-----------+-----------+------------+------------+ Right  0.54       0.50       0.79        0.55         +-------+-----------+-----------+------------+------------+ Left   0.60       0.32       0.64        0.40         +-------+-----------+-----------+------------+------------+ TOES Findings: +----------+---------------+--------+----------+ Right ToesPressure (mmHg)WaveformComment    +----------+---------------+--------+----------+ 1st Digit 83             Abnormaldamped     +----------+---------------+--------+----------+ 2nd Digit                Abnormaldamped     +----------+---------------+--------+----------+ 3rd Digit                        amputation +----------+---------------+--------+----------+ 4th Digit                Abnormaldamped     +----------+---------------+--------+----------+ 5th Digit                Abnormaldamped     +----------+---------------+--------+----------+ +---------+---------------+--------+-------+ Left ToesPressure (mmHg)WaveformComment +---------+---------------+--------+-------+ 1st Digit52             Abnormaldamped  +---------+---------------+--------+-------+ 2nd Digit               Abnormaldamped  +---------+---------------+--------+-------+ 3rd Digit               Abnormaldamped  +---------+---------------+--------+-------+ 4th Digit               Abnormaldamped  +---------+---------------+--------+-------+ 5th Digit                Abnormaldamped  +---------+---------------+--------+-------+  Right ABIs appear decreased compared to prior study on 1/18. Left ABIs appear essentially unchanged compared to prior study on 1/18. See Arterial and Visceral Duplex studies.  Summary: Right: Resting right ankle-brachial index indicates moderate right lower extremity arterial disease. The right toe-brachial index is abnormal. PPG tracings appear dampened. Left: Resting left ankle-brachial index indicates moderate left lower extremity arterial disease. The left toe-brachial index is abnormal. PPG tracings appear dampened.  *See table(s) above for measurements and observations.  Suggest follow up study in 12 months.    Preliminary  LOWER EXTREMITY DOPPLER STUDY Indications: Peripheral artery disease. High Risk         Hypertension, hyperlipidemia, coronary artery disease, prior Factors:          CVA. Other Factors: Known distal left superficial femoral artery occlusin, known  right posterior tibial artery occlusion, and left posterior and                anterior tibial artery occlusions.  Vascular Interventions: S/p right superficial femoral artery atherectomy with                         PTA in 2006, overlapping stents in 4/00, and cutting                         balloon PTA in 2012. History of right iliac artery stent                         in 2006 and bilateral renal artery stents. Comparison Study: In 1/18, ABI's were 0.79 on the right and 0.64 on the left.                   Patient denies any abdominal and lower extremity pain. Performing Technologist: Guinevere Ferrari RVT  Examination Guidelines: A complete evaluation includes at minimum, Doppler waveform signals and systolic blood pressure reading at the level of bilateral brachial, anterior tibial, and posterior tibial arteries, when vessel segments are accessible. Bilateral testing is considered an integral part of a complete examination. Photoelectric Plethysmograph (PPG) waveforms  and toe systolic pressure readings are included as required and additional duplex testing as needed. Limited examinations for reoccurring indications may be performed as noted.  ABI Findings: +---------+------------------+-----+-------------------+--------+ Right    Rt Pressure (mmHg)IndexWaveform           Comment  +---------+------------------+-----+-------------------+--------+ Brachial 165                                                +---------+------------------+-----+-------------------+--------+ ATA      89                0.54 dampened monophasic         +---------+------------------+-----+-------------------+--------+ PTA                             absent                      +---------+------------------+-----+-------------------+--------+ PERO     76                0.46 dampened monophasic         +---------+------------------+-----+-------------------+--------+ Great Toe83                0.50 Abnormal                    +---------+------------------+-----+-------------------+--------+ +---------+------------------+-----+-------------------+-------+ Left     Lt Pressure (mmHg)IndexWaveform           Comment +---------+------------------+-----+-------------------+-------+ Brachial 159                                               +---------+------------------+-----+-------------------+-------+ ATA                             absent                     +---------+------------------+-----+-------------------+-------+  PTA      94                0.57 dampened monophasic        +---------+------------------+-----+-------------------+-------+ PERO     99                0.60 dampened monophasic        +---------+------------------+-----+-------------------+-------+ Great Toe52                0.32 Abnormal                   +---------+------------------+-----+-------------------+-------+  +-------+-----------+-----------+------------+------------+ ABI/TBIToday's ABIToday's TBIPrevious ABIPrevious TBI +-------+-----------+-----------+------------+------------+ Right  0.54       0.50       0.79        0.55         +-------+-----------+-----------+------------+------------+ Left   0.60       0.32       0.64        0.40         +-------+-----------+-----------+------------+------------+ TOES Findings: +----------+---------------+--------+----------+ Right ToesPressure (mmHg)WaveformComment    +----------+---------------+--------+----------+ 1st Digit 83             Abnormaldamped     +----------+---------------+--------+----------+ 2nd Digit                Abnormaldamped     +----------+---------------+--------+----------+ 3rd Digit                        amputation +----------+---------------+--------+----------+ 4th Digit                Abnormaldamped     +----------+---------------+--------+----------+ 5th Digit                Abnormaldamped     +----------+---------------+--------+----------+ +---------+---------------+--------+-------+ Left ToesPressure (mmHg)WaveformComment +---------+---------------+--------+-------+ 1st Digit52             Abnormaldamped  +---------+---------------+--------+-------+ 2nd Digit               Abnormaldamped  +---------+---------------+--------+-------+ 3rd Digit               Abnormaldamped  +---------+---------------+--------+-------+ 4th Digit               Abnormaldamped  +---------+---------------+--------+-------+ 5th Digit               Abnormaldamped  +---------+---------------+--------+-------+  Right ABIs appear decreased compared to prior study on 1/18. Left ABIs appear essentially unchanged compared to prior study on 1/18. See Arterial and Visceral Duplex studies.  Summary: Right: Resting right ankle-brachial index indicates moderate right lower extremity arterial disease. The  right toe-brachial index is abnormal. PPG tracings appear dampened. Left: Resting left ankle-brachial index indicates moderate left lower extremity arterial disease. The left toe-brachial index is abnormal. PPG tracings appear dampened.  *See table(s) above for measurements and observations.  Suggest follow up study in 12 months. Electronically signed by Kathlyn Sacramento MD on 06/05/2018 at 12:45:47 PM.    Final    Vas US Carotid  Result Date: 06/04/2018 Carotid Arterial Duplex Study Indications:  Carotid artery disease. Risk Factors: Hypertension, hyperlipidemia, no history of smoking, coronary               artery disease, prior CVA, PAD. Limitations:  Body habitus, respiration artifact Performing Technologist: Burley Saver RVT  Examination Guidelines: A complete evaluation includes B-mode imaging, spectral Doppler, color Doppler, and power Doppler as needed of all accessible portions of each vessel. Bilateral testing is considered an integral  part of a complete examination. Limited examinations for reoccurring indications may be performed as noted.  Right Carotid Findings: +----------+--------+--------+--------+------------+---------+           PSV cm/sEDV cm/sStenosisDescribe    Comments  +----------+--------+--------+--------+------------+---------+ CCA Prox  73      16                                    +----------+--------+--------+--------+------------+---------+ CCA Mid   64      14                                    +----------+--------+--------+--------+------------+---------+ CCA Distal72      14              heterogenous          +----------+--------+--------+--------+------------+---------+ ICA Prox  274     97      60-79%  calcific    Shadowing +----------+--------+--------+--------+------------+---------+ ICA Mid   123     41              heterogenous          +----------+--------+--------+--------+------------+---------+ ICA Distal31      16                                     +----------+--------+--------+--------+------------+---------+ ECA       126     15              heterogenous          +----------+--------+--------+--------+------------+---------+ +----------+--------+-------+----------------+-------------------+           PSV cm/sEDV cmsDescribe        Arm Pressure (mmHG) +----------+--------+-------+----------------+-------------------+ [id number]             Multiphasic, WNL                    +----------+--------+-------+----------------+-------------------+ +---------+--------+--+--------+--+---------+ VertebralPSV cm/s33EDV cm/s18Antegrade +---------+--------+--+--------+--+---------+  Left Carotid Findings: +----------+--------+--------+--------+------------+---------+           PSV cm/sEDV cm/sStenosisDescribe    Comments  +----------+--------+--------+--------+------------+---------+ CCA Prox  69      18              heterogenous          +----------+--------+--------+--------+------------+---------+ CCA Mid   56      18              calcific              +----------+--------+--------+--------+------------+---------+ CCA Distal64      19              heterogenous          +----------+--------+--------+--------+------------+---------+ ICA Prox  78      25      1-39%   calcific    Shadowing +----------+--------+--------+--------+------------+---------+ ICA Mid   70      26              heterogenous          +----------+--------+--------+--------+------------+---------+ ICA Distal61      20                                    +----------+--------+--------+--------+------------+---------+ ECA  56      19              heterogenous          +----------+--------+--------+--------+------------+---------+ +----------+--------+--------+----------------+-------------------+ SubclavianPSV cm/sEDV cm/sDescribe        Arm Pressure (mmHG)  +----------+--------+--------+----------------+-------------------+           74              Multiphasic, WNL                    +----------+--------+--------+----------------+-------------------+ +---------+--------+--+--------+--+---------+ VertebralPSV cm/s28EDV cm/s13Antegrade +---------+--------+--+--------+--+---------+  Summary: Right Carotid: Velocities in the right ICA are consistent with a 60-79%                stenosis. Calcific plaque may obscure higher velocities. Left Carotid: Velocities in the left ICA are consistent with a 1-39% stenosis.               Calcific plaque may obscure higher velocities. Vertebrals:  Bilateral vertebral arteries demonstrate antegrade flow. Subclavians: Normal flow hemodynamics were seen in bilateral subclavian              arteries. *See table(s) above for measurements and observations.  Electronically signed by Ruta Hinds MD on 06/04/2018 at 4:58:43 PM.    Final    Vas [place] Lower Extremity Arterial Duplex  Result Date: 06/05/2018 LOWER EXTREMITY ARTERIAL DUPLEX STUDY Indications: Peripheral artery disease. High Risk         Hypertension, hyperlipidemia, coronary artery disease, prior Factors:          CVA. 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  Vascular Interventions: S/p right superficial femoral artery atherectomy with                         PTA in 2006, overlapping stents in 4/00, and cutting                         balloon PTA in 2012. History of right iliac artery stent                         in 2006 and bilateral renal artery stents. Current ABI:            0.54 on the right and 0.60 on the left. Comparison Study: In 1/18, ABI's were 0.79 on the right and 0.64 on the left.                   Patient denies any abdominal and lower extremity pain. Performing Technologist: Guinevere Ferrari RVT  Examination Guidelines: A complete evaluation includes B-mode imaging, spectral Doppler, color Doppler, and power Doppler as needed of all accessible portions of each vessel. Bilateral testing is considered an integral part of a complete examination. Limited examinations for reoccurring indications may be performed as noted.  Right Duplex Findings: +----------+--------+-----+--------+----------+--------------------+           PSV cm/sRatioStenosisWaveform  Comments             +----------+--------+-----+--------+----------+--------------------+ CFA Prox  71  biphasic  heterogeneous plaque +----------+--------+-----+--------+----------+--------------------+ DFA       171                  biphasic  heterogeneous plaque +----------+--------+-----+--------+----------+--------------------+ SFA Prox                                                      +----------+--------+-----+--------+----------+--------------------+ POP Prox  28                   monophasicheterogeneous plaque +----------+--------+-----+--------+----------+--------------------+ POP Distal14                   monophasicheterogeneous plaque +----------+--------+-----+--------+----------+--------------------+  Right Stent(s): +---------------+--------+---------------+----------+--------------------------+ SFA            PSV cm/sStenosis       Waveform  Comments                   +---------------+--------+---------------+----------+--------------------------+ Prox to Stent  142                    biphasic  heterogeneous plaque       +---------------+--------+---------------+----------+--------------------------+ Proximal Stent 76                     monophasicheterogeneous plaque with                                                  shadowing                  +---------------+--------+---------------+----------+--------------------------+ Mid Stent      89                      monophasicheterogeneous plaque       +---------------+--------+---------------+----------+--------------------------+ Distal Stent   290     50-99% stenosismonophasicheterogeneous plaque with                                                  collateral flow            +---------------+--------+---------------+----------+--------------------------+ Distal to Stent23                     monophasicheterogeneous plaque       +---------------+--------+---------------+----------+--------------------------+  Summary: Right: Patent right SFA stent. >50% in-stent restenosis in the distal SFA. See Arterial Doppler and Visceral Duplex.  See table(s) above for measurements and observations. Suggest follow up study in 12 months.    Preliminary  LOWER EXTREMITY ARTERIAL DUPLEX STUDY Indications: Peripheral artery disease. High Risk         Hypertension, hyperlipidemia, coronary artery disease, prior Factors:          CVA. 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  Vascular Interventions: S/p right superficial femoral artery atherectomy with  PTA in 2006, overlapping stents in 4/00, and cutting                         balloon PTA in 2012. History of right iliac artery stent                         in 2006 and bilateral renal artery stents. Current ABI:            0.54 on the right and 0.60 on the left. Comparison Study: In 1/18, ABI's were 0.79 on the right and 0.64 on the left.                   Patient denies any abdominal and lower extremity pain. Performing Technologist: Guinevere Ferrari RVT  Examination Guidelines: A complete evaluation includes B-mode imaging, spectral Doppler, color Doppler, and power Doppler as needed of all accessible portions of each vessel. Bilateral testing is considered an integral part  of a complete examination. Limited examinations for reoccurring indications may be performed as noted.  Right Duplex Findings: +----------+--------+-----+--------+----------+--------------------+           PSV cm/sRatioStenosisWaveform  Comments             +----------+--------+-----+--------+----------+--------------------+ CFA Prox  71                   biphasic  heterogeneous plaque +----------+--------+-----+--------+----------+--------------------+ DFA       171                  biphasic  heterogeneous plaque +----------+--------+-----+--------+----------+--------------------+ SFA Prox                                                      +----------+--------+-----+--------+----------+--------------------+ POP Prox  28                   monophasicheterogeneous plaque +----------+--------+-----+--------+----------+--------------------+ POP Distal14                   monophasicheterogeneous plaque +----------+--------+-----+--------+----------+--------------------+  Right Stent(s): +---------------+--------+---------------+----------+--------------------------+ SFA            PSV cm/sStenosis       Waveform  Comments                   +---------------+--------+---------------+----------+--------------------------+ Prox to Stent  142                    biphasic  heterogeneous plaque       +---------------+--------+---------------+----------+--------------------------+ Proximal Stent 76                     monophasicheterogeneous plaque with                                                  shadowing                  +---------------+--------+---------------+----------+--------------------------+ Mid Stent      89                     monophasicheterogeneous plaque       +---------------+--------+---------------+----------+--------------------------+ Distal Stent   290  50-99% stenosismonophasicheterogeneous plaque with                                                   collateral flow            +---------------+--------+---------------+----------+--------------------------+ Distal to Stent23                     monophasicheterogeneous plaque       +---------------+--------+---------------+----------+--------------------------+  Summary: Right: Patent right SFA stent. >50% in-stent restenosis in the distal SFA. See Arterial Doppler and Visceral Duplex.  See table(s) above for measurements and observations. Suggest follow up study in 12 months. Electronically signed by Kathlyn Sacramento MD on 06/05/2018 at 12:43:02 PM.    Final    Vas US Aorta/ivc/iliacs  Result Date: 06/05/2018 ABDOMINAL AORTA STUDY Indications: PAD, s/p right SFA and iliac artery stents. Risk Factors: Hypertension, hyperlipidemia, coronary artery disease, prior CVA. 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 Vascular Interventions: S/p right superficial femoral artery atherectomy with                         PTA in 2006, overlapping stents in 4/00, and cutting                         balloon PTA in 2012. History of right iliac artery stent                         in 2006 and bilateral renal artery stents. Limitations: Obesity and air/bowel gas.  Comparison Study: In 1/18, ABI's were 0.79 on the right and 0.64 on the left.                   Today's ABI's were 0.54 on the right and 0.60 on the left.                   Patient denies any abdominal and lower extremity pain. Performing Technologist: Guinevere Ferrari RVT  Examination Guidelines: A complete evaluation includes B-mode imaging, spectral Doppler, color Doppler, and power Doppler as needed of all accessible portions of each vessel. Bilateral testing is considered an integral part of a complete examination. Limited examinations for reoccurring indications may  be performed as noted.  Abdominal Aorta Findings: +-------------+-------+----------+----------+--------+---------+--------+ Location     AP (cm)Trans (cm)PSV (cm/s)WaveformThrombus Comments +-------------+-------+----------+----------+--------+---------+--------+ Proximal     1.52   1.52      65                                  +-------------+-------+----------+----------+--------+---------+--------+ Mid                           57                calcified         +-------------+-------+----------+----------+--------+---------+--------+ Distal  78        biphasiccalcified         +-------------+-------+----------+----------+--------+---------+--------+ RT EIA Prox                   67        biphasiccalcified         +-------------+-------+----------+----------+--------+---------+--------+ LT CIA Prox                   79        biphasiccalcified         +-------------+-------+----------+----------+--------+---------+--------+ LT CIA Mid                    73        biphasiccalcified         +-------------+-------+----------+----------+--------+---------+--------+ LT CIA Distal                 95                calcified         +-------------+-------+----------+----------+--------+---------+--------+ LT EIA Prox                   95        biphasic                  +-------------+-------+----------+----------+--------+---------+--------+  Visualization of the Proximal Abdominal Aorta, Right EIA Proximal artery and Left EIA Proximal artery was limited. IVC/Iliac Findings: +--------+------+--------+-------+         PatentThrombusComment +--------+------+--------+-------+ IVC Proxpatent                +--------+------+--------+-------+  Right Stent(s): +-------------------+--------+--------+--------+--------+ common iliac arteryPSV cm/sStenosisWaveformComments  +-------------------+--------+--------+--------+--------+ Prox to Stent      49                               +-------------------+--------+--------+--------+--------+ Proximal Stent     96                               +-------------------+--------+--------+--------+--------+ Mid Stent          86              biphasic         +-------------------+--------+--------+--------+--------+ Distal Stent       94              biphasic         +-------------------+--------+--------+--------+--------+ Distal to Stent    95                               +-------------------+--------+--------+--------+--------+  Summary: Abdominal Aorta: No evidence of an abdominal aortic aneurysm was visualized. The largest aortic measurement is 1.5 cm. Stenosis: Patent right common iliac artery stent with no evidence of restenosis. See Arterial Doppler and Duplex studies.  *See table(s) above for measurements and observations. Suggest follow up study in 12 months.     Preliminary  ABDOMINAL AORTA STUDY Indications: PAD, s/p right SFA and iliac artery stents. Risk Factors: Hypertension, hyperlipidemia, coronary artery disease, prior CVA. Other Factors: Known distal left superficial femoral artery occlusin, known                right posterior tibial artery occlusion, and left posterior and  anterior tibial artery occlusions. Vascular Interventions: S/p right superficial femoral artery atherectomy with                         PTA in 2006, overlapping stents in 4/00, and cutting                         balloon PTA in 2012. History of right iliac artery stent                         in 2006 and bilateral renal artery stents. Limitations: Obesity and air/bowel gas.  Comparison Study: In 1/18, ABI's were 0.79 on the right and 0.64 on the left.                   Today's ABI's were 0.54 on the right and 0.60 on the left.                   Patient denies any abdominal and lower extremity pain. Performing  Technologist: Guinevere Ferrari RVT  Examination Guidelines: A complete evaluation includes B-mode imaging, spectral Doppler, color Doppler, and power Doppler as needed of all accessible portions of each vessel. Bilateral testing is considered an integral part of a complete examination. Limited examinations for reoccurring indications may be performed as noted.  Abdominal Aorta Findings: +-------------+-------+----------+----------+--------+---------+--------+ Location     AP (cm)Trans (cm)PSV (cm/s)WaveformThrombus Comments +-------------+-------+----------+----------+--------+---------+--------+ Proximal     1.52   1.52      65                                  +-------------+-------+----------+----------+--------+---------+--------+ Mid                           57                calcified         +-------------+-------+----------+----------+--------+---------+--------+ Distal                        52        biphasiccalcified         +-------------+-------+----------+----------+--------+---------+--------+ RT EIA Prox                   67        biphasiccalcified         +-------------+-------+----------+----------+--------+---------+--------+ LT CIA Prox                   79        biphasiccalcified         +-------------+-------+----------+----------+--------+---------+--------+ LT CIA Mid                    73        biphasiccalcified         +-------------+-------+----------+----------+--------+---------+--------+ LT CIA Distal                 95                calcified         +-------------+-------+----------+----------+--------+---------+--------+ LT EIA Prox                   95        biphasic                  +-------------+-------+----------+----------+--------+---------+--------+  Visualization of the Proximal Abdominal Aorta, Right EIA Proximal artery and Left EIA Proximal artery was limited. IVC/Iliac Findings:  +--------+------+--------+-------+         PatentThrombusComment +--------+------+--------+-------+ IVC Proxpatent                +--------+------+--------+-------+  Right Stent(s): +-------------------+--------+--------+--------+--------+ common iliac arteryPSV cm/sStenosisWaveformComments +-------------------+--------+--------+--------+--------+ Prox to Stent      49                               +-------------------+--------+--------+--------+--------+ Proximal Stent     96                               +-------------------+--------+--------+--------+--------+ Mid Stent          86              biphasic         +-------------------+--------+--------+--------+--------+ Distal Stent       94              biphasic         +-------------------+--------+--------+--------+--------+ Distal to Stent    95                               +-------------------+--------+--------+--------+--------+  Summary: Abdominal Aorta: No evidence of an abdominal aortic aneurysm was visualized. The largest aortic measurement is 1.5 cm. Stenosis: Patent right common iliac artery stent with no evidence of restenosis. See Arterial Doppler and Duplex studies.  *See table(s) above for measurements and observations. Suggest follow up study in 12 months.  Electronically signed by Kathlyn Sacramento MD on 06/05/2018 at 12:41:44 PM.    Final    Disposition   Pt is being discharged home today in good condition.  Follow-up Plans & Appointments    Follow-up Information    Leonie Man, MD Follow up.   Specialty:  Cardiology Why:  The office will call you to make an appointment with Dr. Darcus Pester NP or PAs in 1-2 weeks Contact information: [address] [place] [address] [phone]        Lorretta Harp, MD Follow up.   Specialties:  Cardiology, Radiology Why:  The office will call you to arrange an appointmnet with Dr. Gwenlyn Found to discuss your peripheral artery  disease Contact information: [address] [place] [address] [phone]        Burnell Blanks, MD. Go on 07/31/2018.   Specialty:  Cardiology Why:  @ 9:30am for follow up of your bad valve Contact information: [place] [place] [address] [phone]          Discharge Instructions    AMB Referral to Cardiac Rehabilitation - Phase II   Complete by:  As directed    Diagnosis:  STEMI   Amb Referral to Cardiac Rehabilitation   Complete by:  As directed    Diagnosis:   STEMI Coronary Stents        Discharge Medications   Allergies as of 06/27/2018      Reactions   Codeine Rash      Medication List    STOP taking these medications   amLODipine 5 MG tablet Commonly known as:  NORVASC   chlorthalidone 25 MG tablet Commonly known as:  HYGROTON   clopidogrel 75 MG tablet Commonly known  as:  PLAVIX     TAKE these medications   acetaminophen 500 MG tablet Commonly known as:  TYLENOL Take 500 mg by mouth every 6 (six) hours as needed for mild pain, moderate pain or headache.   aspirin 81 MG EC tablet Take 1 tablet (81 mg total) by mouth daily. Swallow whole.   carvedilol 3.125 MG tablet Commonly known as:  COREG Take 1 tablet (3.125 mg total) by mouth 2 (two) times daily with a meal.   FLUoxetine 10 MG capsule Commonly known as:  PROZAC TAKE 1 CAPSULE(10 MG) BY MOUTH DAILY What changed:  See the new instructions.   nitroGLYCERIN 0.4 MG SL tablet Commonly known as:  NITROSTAT Place 1 tablet (0.4 mg total) under the tongue every 5 (five) minutes x 3 doses as needed for chest pain.   OVER THE COUNTER MEDICATION Take 1 capsule by mouth daily as needed (constipation). STOOL SOFTNER   pantoprazole 40 MG tablet Commonly known as:  PROTONIX TAKE 1 TABLET BY MOUTH DAILY 45 MINUTES BEFORE BREAKFAST What changed:    how much to take  how to take this  when to take this  additional instructions     potassium chloride 20 MEQ/15ML (10%) Soln TAKE 15 MLS BY MOUTH TWICE DAILY What changed:  See the new instructions.   rosuvastatin 20 MG tablet Commonly known as:  CRESTOR TAKE 1 TABLET(20 MG) BY MOUTH AT BEDTIME What changed:  See the new instructions.   SOOTHE OP Place 1 drop into both eyes 2 (two) times daily.   ticagrelor 90 MG Tabs tablet Commonly known as:  BRILINTA Take 1 tablet (90 mg total) by mouth 2 (two) times daily.   traMADol 50 MG tablet Commonly known as:  ULTRAM Take 1 tablet (50 mg total) by mouth every 8 (eight) hours as needed.         Outstanding Labs/Studies   BMET  Duration of Discharge Encounter   Greater than 30 minutes including physician time.  Signed, Will Meredith Leeds, MD 06/28/2018, 7:45 AM [phone] I have seen and examined this patient with Angelena Form.  Agree with above, note added to reflect my findings.  On exam, RRR, no murmurs, lungs clear. Patient with STEMI. Severe CAD noted.   Had POBA of LAD with collaterals to RCA. Plan for discharge with follow up in clinic.  Will M. Camnitz MD 06/28/2018 7:46 AM

## 2018-06-29 ENCOUNTER — Telehealth (HOSPITAL_COMMUNITY): Payer: Self-pay

## 2018-06-29 ENCOUNTER — Telehealth: Payer: Self-pay | Admitting: Cardiology

## 2018-06-29 DIAGNOSIS — I469 Cardiac arrest, cause unspecified: Secondary | ICD-10-CM | POA: Diagnosis not present

## 2018-06-29 DIAGNOSIS — R509 Fever, unspecified: Secondary | ICD-10-CM | POA: Diagnosis not present

## 2018-06-29 DIAGNOSIS — I499 Cardiac arrhythmia, unspecified: Secondary | ICD-10-CM | POA: Diagnosis not present

## 2018-06-29 DIAGNOSIS — R404 Transient alteration of awareness: Secondary | ICD-10-CM | POA: Diagnosis not present

## 2018-06-30 ENCOUNTER — Telehealth (HOSPITAL_COMMUNITY): Payer: Self-pay

## 2018-06-30 NOTE — Telephone Encounter (Signed)
Attempted to contact in regards to Cardiac Rehab, per pt gdaughter Sharyn Lull pt passed away 2018-07-20.   Closed referral

## 2018-07-02 ENCOUNTER — Telehealth: Payer: Self-pay | Admitting: Family Medicine

## 2018-07-02 NOTE — Telephone Encounter (Signed)
Representative from [facility] dropped off pt's death certificate to be completed. Will bring to [employer]. Please return to [facility]. Call [facility] at [phone] when ready.

## 2018-07-03 NOTE — Telephone Encounter (Signed)
Sympathy card sent 

## 2018-07-26 DIAGNOSIS — 419620001 Death: Secondary | SNOMED CT | POA: Diagnosis not present

## 2018-07-26 NOTE — Telephone Encounter (Signed)
Follow up   Per EMS the death certificate has already been signed by the patient's primary physician. Please disregard.

## 2018-07-26 NOTE — Telephone Encounter (Signed)
° ° °  EMS calling to request Dr Ellyn Hack sign death certificate. EMS also advised to reach out to PCP.  Phone [phone]

## 2018-07-26 NOTE — Telephone Encounter (Signed)
Pt insurance is active and benefits verified through [employer]. Co-pay $20.00, DED $0.00/$0.00 met, out of pocket $4,400.00/$621.40 met, co-insurance 0%. No pre-authorization. Passport, 07/10/2018 @ 11:43AM, REF# [id number] ° °Will contact patient to see if she is interested in the Cardiac Rehab Program. If interested, patient will need to complete follow up appt. Once completed, patient will be contacted for scheduling upon review by the RN Navigator. °

## 2018-07-26 DEATH — deceased

## 2018-07-31 ENCOUNTER — Institutional Professional Consult (permissible substitution): Payer: Medicare Other | Admitting: Cardiovascular Disease

## 2018-10-07 IMAGING — DX DG CHEST 2V
2 series · 2 of 2 positions shown · non-contrast
Comparison: 09/02/2016.

CLINICAL DATA: Cough and chest congestion over the last 2 weeks.

EXAM:
CHEST - 2 VIEW

[dg chest 2 view (1 of 2)]
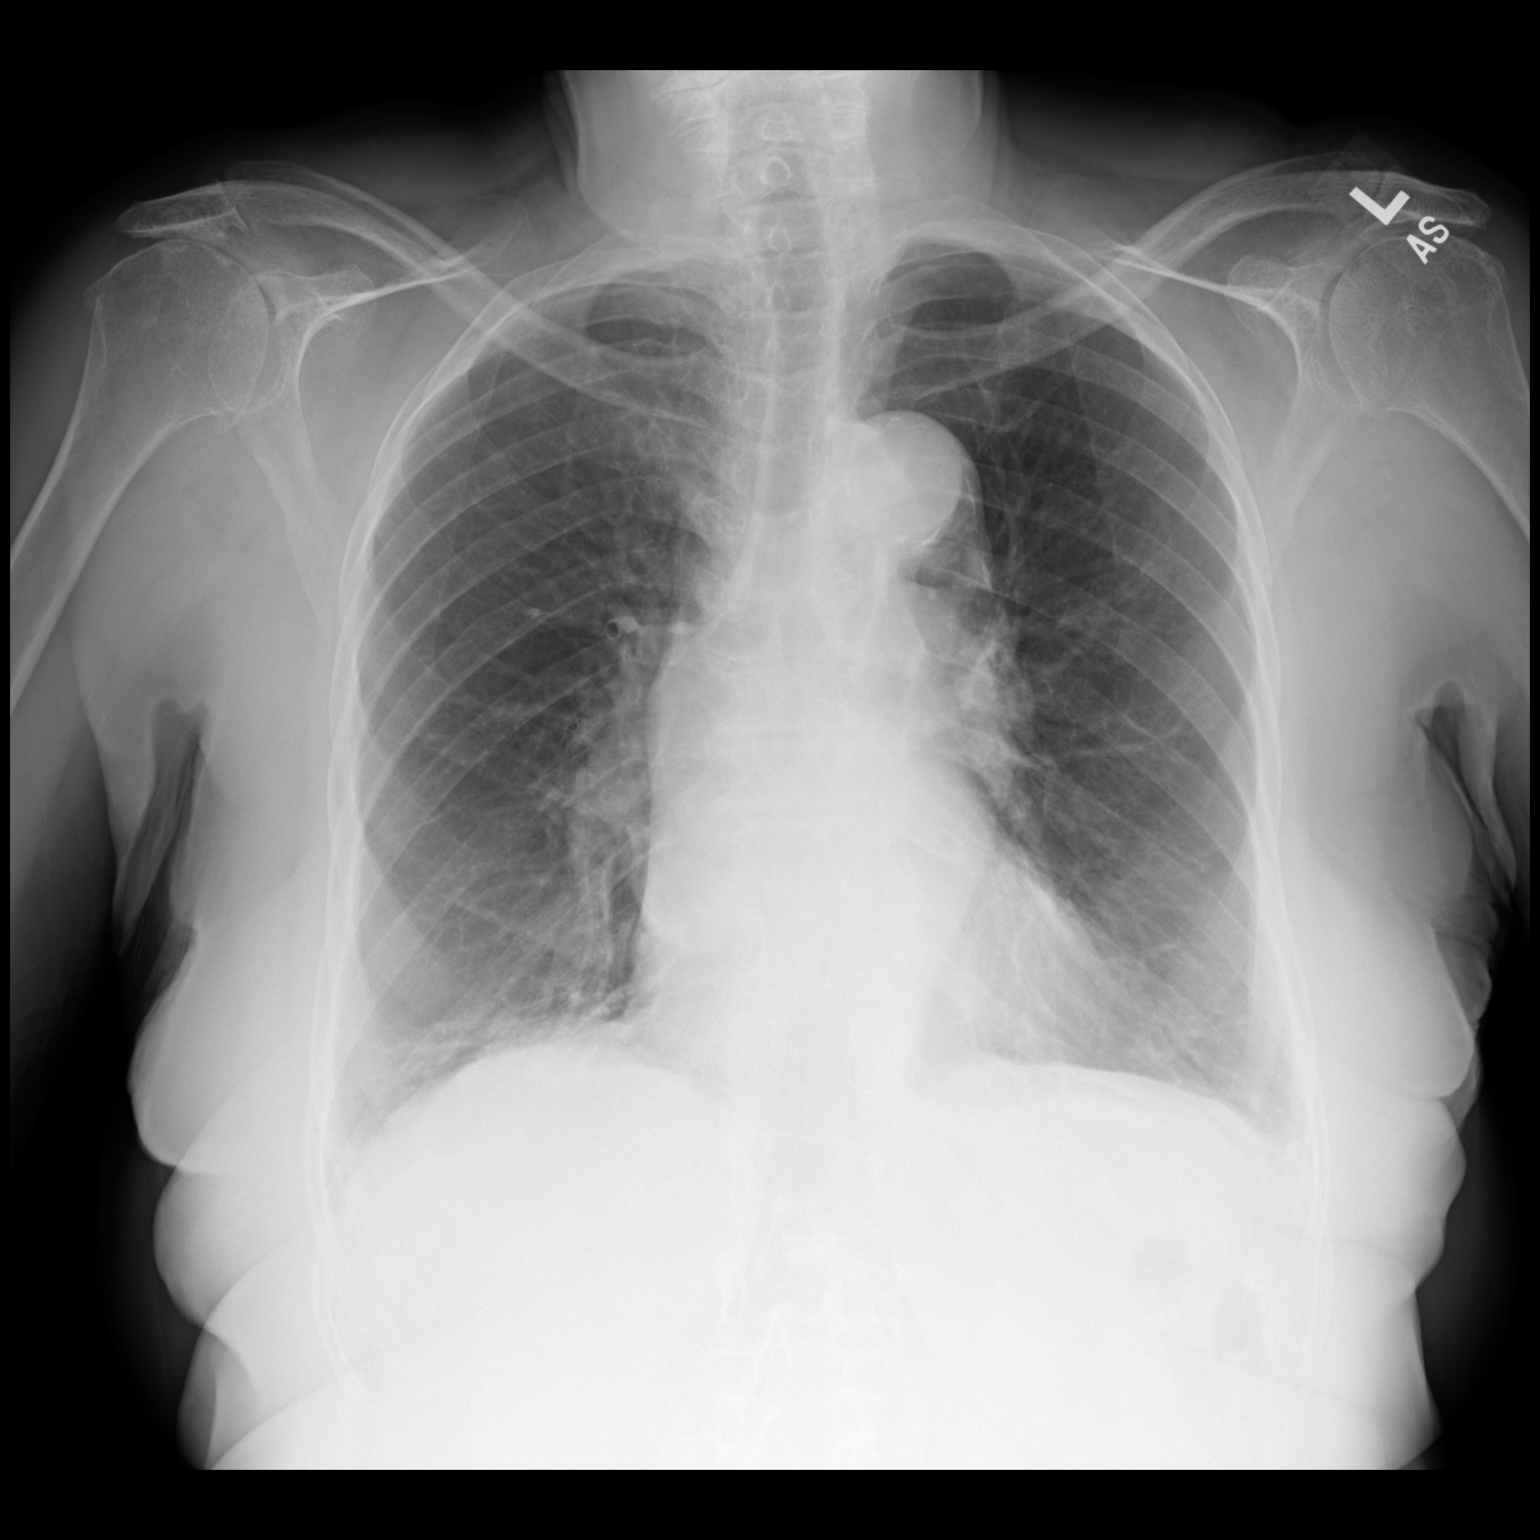

[dg chest 2 view (2 of 2)]
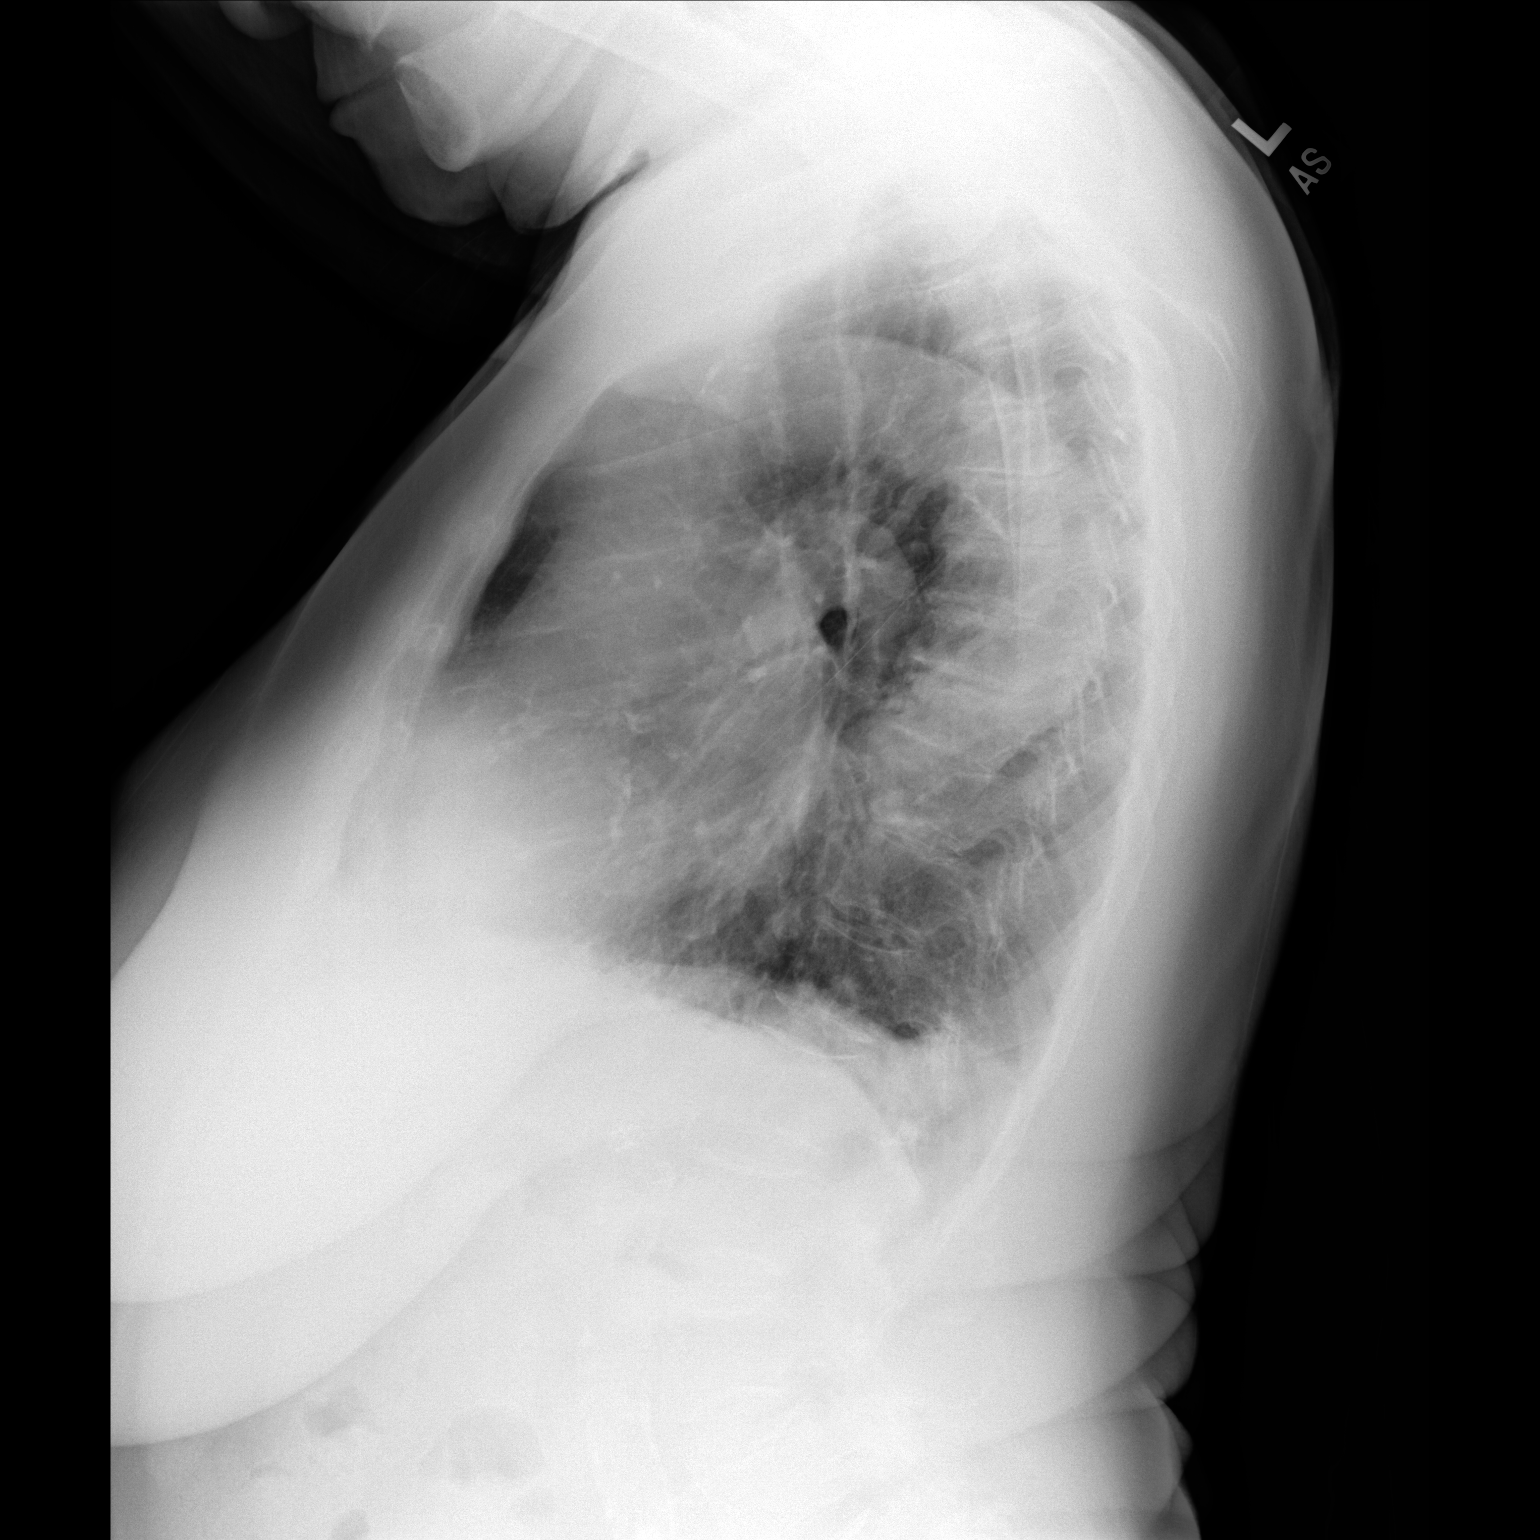

[2 of 2 positions shown; findings below may reference images not displayed]

FINDINGS: Heart size is normal. Chronic atherosclerosis and unfolding of the
thoracic aorta. Chronic abnormal density at both lung bases. This
could be scarring, but an element of atelectasis and or basilar
infiltrate is not excluded. No evidence of heart failure or
effusion.
IMPRESSION: Chronically seen abnormal density at both lung bases. Whereas this
could represent chronic scarring, the possibility of atelectasis and
or basilar pneumonia is not completely excluded.

## 2018-10-07 IMAGING — DX DG ABDOMEN 1V
1 series · 1 of 1 positions shown · non-contrast
Comparison: 09/23/2012

CLINICAL DATA: Epigastric pain and diarrhea over the last 2 weeks.

EXAM:
ABDOMEN - 1 VIEW

[dg abd 1 view]
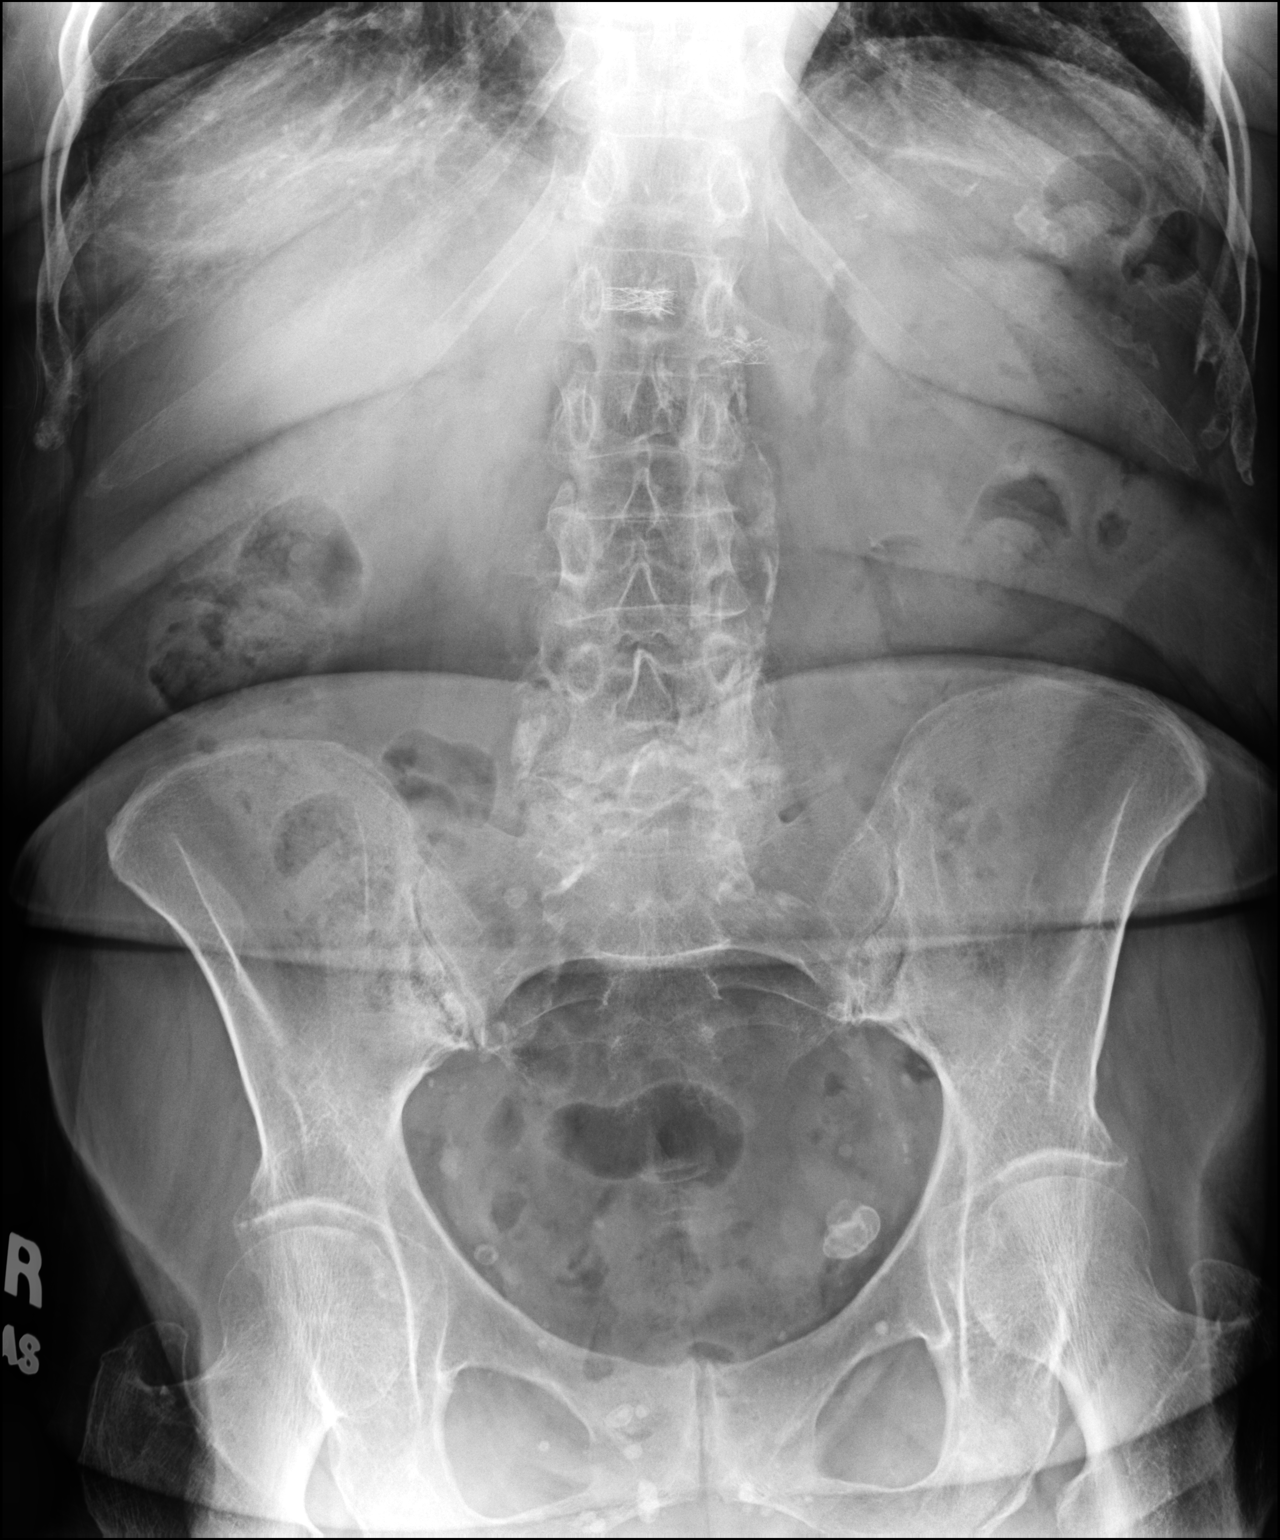

[1 of 1 positions shown; findings below may reference images not displayed]

FINDINGS: Bowel gas pattern does not show evidence of ileus or obstruction.
The patient has vascular calcification including bilateral renal
stents. No abnormal bone finding.
IMPRESSION: Negative/normal bowel gas pattern. Aortic atherosclerosis and renal
artery stents.

## 2018-12-04 ENCOUNTER — Encounter (HOSPITAL_COMMUNITY): Payer: Medicare Other

## 2018-12-04 ENCOUNTER — Ambulatory Visit: Payer: Medicare Other | Admitting: Family

## 2019-05-03 IMAGING — DX DG CHEST 1V PORT
1 series · 1 of 1 positions shown · non-contrast
Comparison: 11/26/2017

CLINICAL DATA: Chest pain.

EXAM:
PORTABLE CHEST 1 VIEW

[chest ap]
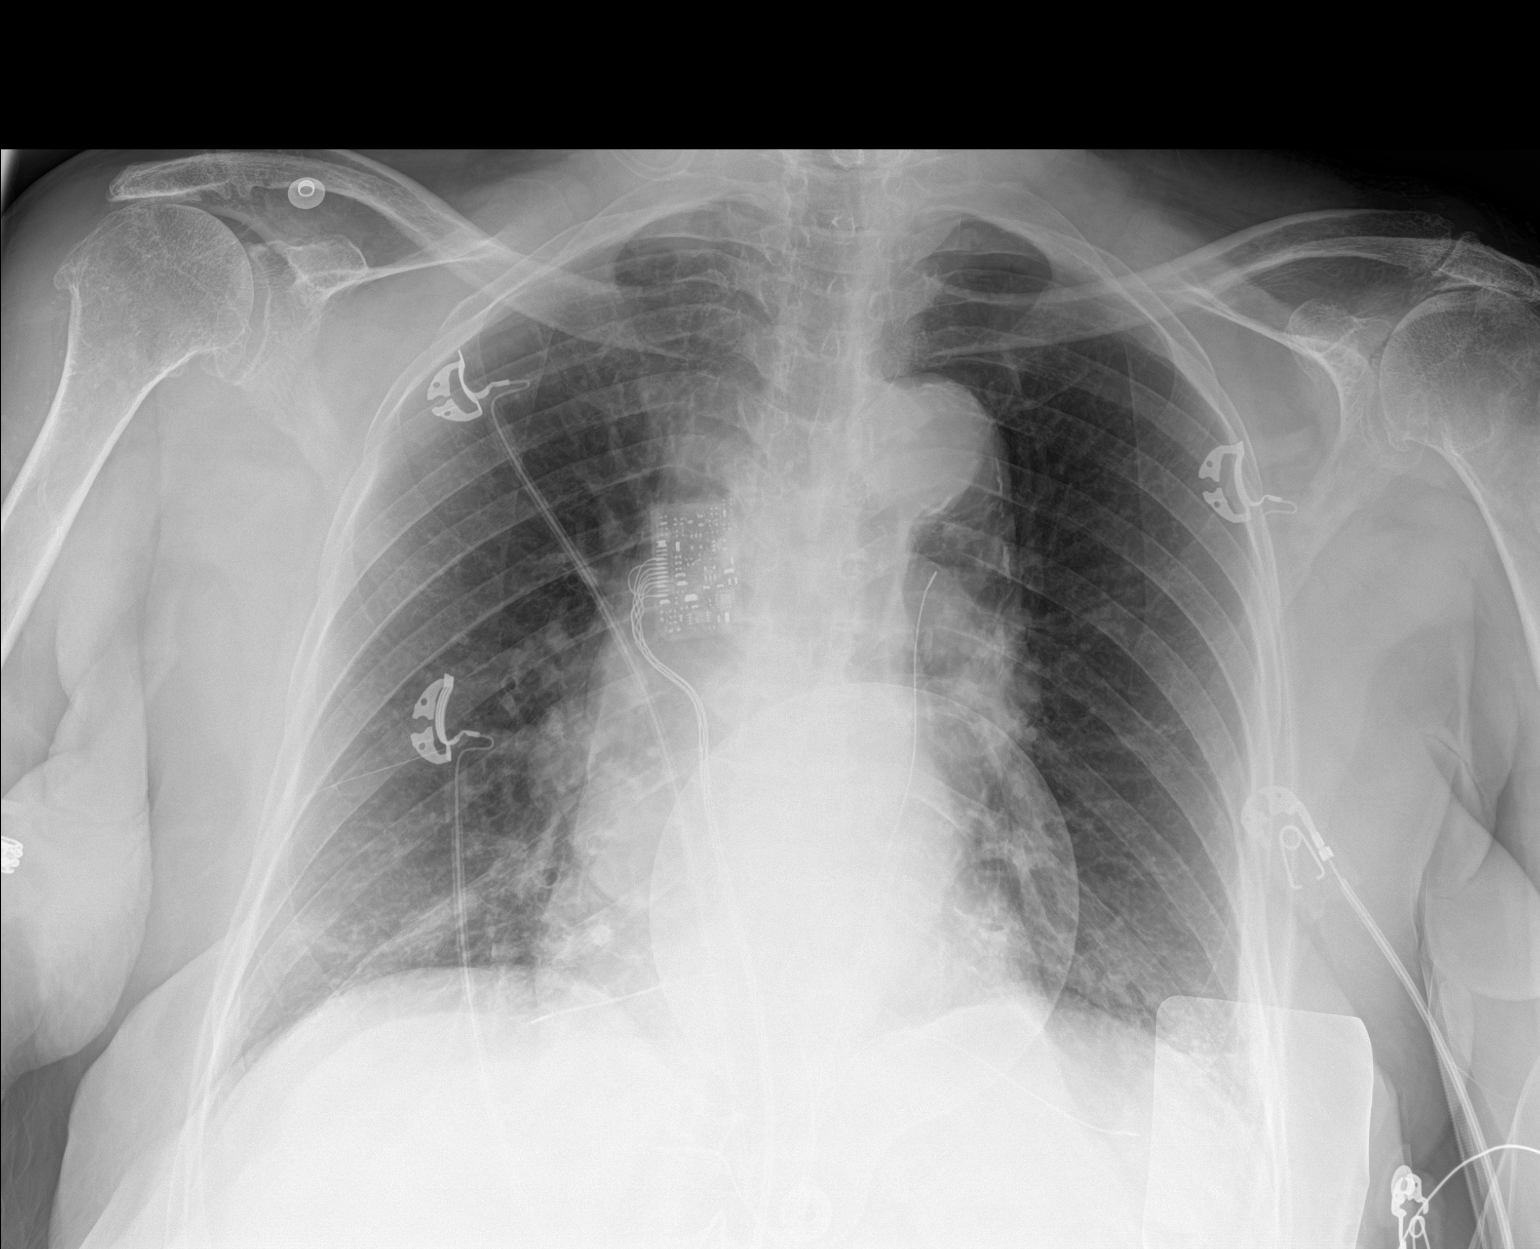

[1 of 1 positions shown; findings below may reference images not displayed]

FINDINGS: Lungs are adequately inflated without focal airspace consolidation
or effusion. Borderline stable cardiomegaly. Remainder of the exam
is unchanged.
IMPRESSION: No acute cardiopulmonary disease.
